# Patient Record
Sex: Male | Born: 1955 | Race: White | Hispanic: No | Marital: Married | Smoking: Never smoker
Health system: Southern US, Community
[De-identification: ages and names within clinical notes are randomized; demographics above are authoritative.]

## PROBLEM LIST (undated history)

## (undated) DIAGNOSIS — F5104 Psychophysiologic insomnia: Secondary | ICD-10-CM

## (undated) DIAGNOSIS — Z973 Presence of spectacles and contact lenses: Secondary | ICD-10-CM

## (undated) DIAGNOSIS — K5792 Diverticulitis of intestine, part unspecified, without perforation or abscess without bleeding: Secondary | ICD-10-CM

## (undated) DIAGNOSIS — K5732 Diverticulitis of large intestine without perforation or abscess without bleeding: Secondary | ICD-10-CM

## (undated) DIAGNOSIS — K219 Gastro-esophageal reflux disease without esophagitis: Secondary | ICD-10-CM

## (undated) DIAGNOSIS — K5900 Constipation, unspecified: Secondary | ICD-10-CM

## (undated) DIAGNOSIS — K760 Fatty (change of) liver, not elsewhere classified: Secondary | ICD-10-CM

## (undated) DIAGNOSIS — R338 Other retention of urine: Secondary | ICD-10-CM

## (undated) DIAGNOSIS — R42 Dizziness and giddiness: Secondary | ICD-10-CM

## (undated) DIAGNOSIS — L570 Actinic keratosis: Secondary | ICD-10-CM

## (undated) DIAGNOSIS — N401 Enlarged prostate with lower urinary tract symptoms: Secondary | ICD-10-CM

## (undated) DIAGNOSIS — M189 Osteoarthritis of first carpometacarpal joint, unspecified: Secondary | ICD-10-CM

## (undated) DIAGNOSIS — K210 Gastro-esophageal reflux disease with esophagitis: Secondary | ICD-10-CM

## (undated) DIAGNOSIS — I10 Essential (primary) hypertension: Secondary | ICD-10-CM

## (undated) DIAGNOSIS — M75102 Unspecified rotator cuff tear or rupture of left shoulder, not specified as traumatic: Secondary | ICD-10-CM

## (undated) DIAGNOSIS — M7550 Bursitis of unspecified shoulder: Secondary | ICD-10-CM

## (undated) HISTORY — DX: Constipation, unspecified: K59.00

## (undated) HISTORY — DX: Gastro-esophageal reflux disease with esophagitis: K21.0

## (undated) HISTORY — DX: Benign prostatic hyperplasia with lower urinary tract symptoms: N40.1

## (undated) HISTORY — DX: Osteoarthritis of first carpometacarpal joint, unspecified: M18.9

## (undated) HISTORY — DX: Unspecified rotator cuff tear or rupture of left shoulder, not specified as traumatic: M75.102

## (undated) HISTORY — DX: Psychophysiologic insomnia: F51.04

## (undated) HISTORY — PX: SMALL INTESTINE SURGERY: SHX150

## (undated) HISTORY — DX: Bursitis of unspecified shoulder: M75.50

## (undated) HISTORY — DX: Other retention of urine: R33.8

## (undated) HISTORY — DX: Gastro-esophageal reflux disease without esophagitis: K21.9

## (undated) HISTORY — DX: Diverticulitis of intestine, part unspecified, without perforation or abscess without bleeding: K57.92

## (undated) HISTORY — PX: EYE SURGERY: SHX253

## (undated) HISTORY — PX: COLON SURGERY: SHX602

## (undated) HISTORY — DX: Diverticulitis of large intestine without perforation or abscess without bleeding: K57.32

## (undated) HISTORY — DX: Actinic keratosis: L57.0

## (undated) HISTORY — DX: Essential (primary) hypertension: I10

## (undated) HISTORY — DX: Fatty (change of) liver, not elsewhere classified: K76.0

---

## 1989-05-21 HISTORY — PX: ROTATOR CUFF REPAIR: SHX139

## 1989-05-21 HISTORY — PX: SHOULDER ARTHROSCOPY W/ ROTATOR CUFF REPAIR: SHX2400

## 1990-05-21 HISTORY — PX: RHINOPLASTY: SUR1284

## 2004-10-31 ENCOUNTER — Ambulatory Visit: Payer: Self-pay | Admitting: Orthopaedic Surgery

## 2007-02-04 ENCOUNTER — Emergency Department: Payer: Self-pay

## 2008-04-28 ENCOUNTER — Ambulatory Visit: Payer: Self-pay | Admitting: Internal Medicine

## 2008-12-17 ENCOUNTER — Ambulatory Visit: Payer: Self-pay | Admitting: Family

## 2010-02-17 ENCOUNTER — Ambulatory Visit: Payer: Self-pay | Admitting: Internal Medicine

## 2010-02-21 ENCOUNTER — Ambulatory Visit: Payer: Self-pay | Admitting: Internal Medicine

## 2010-04-27 ENCOUNTER — Ambulatory Visit: Payer: Self-pay | Admitting: Gastroenterology

## 2010-04-28 LAB — PATHOLOGY REPORT

## 2010-10-30 ENCOUNTER — Ambulatory Visit: Payer: Self-pay | Admitting: Internal Medicine

## 2010-11-17 ENCOUNTER — Inpatient Hospital Stay: Payer: Self-pay | Admitting: Internal Medicine

## 2010-12-20 HISTORY — PX: COLON SURGERY: SHX602

## 2010-12-22 ENCOUNTER — Ambulatory Visit: Payer: Self-pay | Admitting: Surgery

## 2010-12-28 ENCOUNTER — Inpatient Hospital Stay: Payer: Self-pay | Admitting: Surgery

## 2011-01-02 LAB — PATHOLOGY REPORT

## 2011-02-02 ENCOUNTER — Ambulatory Visit (INDEPENDENT_AMBULATORY_CARE_PROVIDER_SITE_OTHER): Payer: PRIVATE HEALTH INSURANCE | Admitting: Internal Medicine

## 2011-02-02 ENCOUNTER — Encounter: Payer: Self-pay | Admitting: Internal Medicine

## 2011-02-02 VITALS — BP 114/76 | HR 80 | Temp 97.8°F | Resp 16 | Ht 71.0 in | Wt 201.8 lb

## 2011-02-02 DIAGNOSIS — J329 Chronic sinusitis, unspecified: Secondary | ICD-10-CM

## 2011-02-02 DIAGNOSIS — K5732 Diverticulitis of large intestine without perforation or abscess without bleeding: Secondary | ICD-10-CM

## 2011-02-02 MED ORDER — AMOXICILLIN-POT CLAVULANATE 875-125 MG PO TABS: 1.0000 | ORAL_TABLET | Freq: Two times a day (BID) | ORAL | Status: AC

## 2011-02-02 MED ORDER — HYDROCODONE-ACETAMINOPHEN 5-500 MG PO TABS: 2.0000 | ORAL_TABLET | Freq: Every evening | ORAL | Status: AC | PRN

## 2011-02-04 ENCOUNTER — Encounter: Payer: Self-pay | Admitting: Internal Medicine

## 2011-02-04 DIAGNOSIS — K5732 Diverticulitis of large intestine without perforation or abscess without bleeding: Secondary | ICD-10-CM

## 2011-02-04 DIAGNOSIS — K5730 Diverticulosis of large intestine without perforation or abscess without bleeding: Secondary | ICD-10-CM | POA: Insufficient documentation

## 2011-02-04 HISTORY — DX: Diverticulitis of large intestine without perforation or abscess without bleeding: K57.32

## 2011-02-04 NOTE — Assessment & Plan Note (Signed)
With 3 episodes in one year.  He is now s/p sigmoid resection with no complications.  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

## 2011-02-04 NOTE — Progress Notes (Signed)
  Subjective:    Patient ID: Seth Doyle, male    DOB: [birth year], 55 y.o.   MRN: [id number]  HPI Helthy 55 yo white male recently discharged from [facility] following uncomplicated sigmoid colon resection done by Renda Rolls electively for recurrent diverticulitis.  Patient denies abdoinal pain and diarrhea.  Has been following instructions regarding activity and diet.  Past Medical History  Diagnosis Date  . Diverticulitis    No current outpatient prescriptions on file prior to visit.    Review of Systems  Constitutional: Negative for fever, chills, diaphoresis, activity change, appetite change, fatigue and unexpected weight change.  HENT: Negative for hearing loss, ear pain, nosebleeds, congestion, sore throat, facial swelling, rhinorrhea, sneezing, drooling, mouth sores, trouble swallowing, neck pain, neck stiffness, dental problem, voice change, postnasal drip, sinus pressure, tinnitus and ear discharge.   Eyes: Negative for photophobia, pain, discharge, redness, itching and visual disturbance.  Respiratory: Negative for apnea, cough, choking, chest tightness, shortness of breath, wheezing and stridor.   Cardiovascular: Negative for chest pain, palpitations and leg swelling.  Gastrointestinal: Negative for nausea, vomiting, abdominal pain, diarrhea, constipation, blood in stool, abdominal distention, anal bleeding and rectal pain.  Genitourinary: Negative for dysuria, urgency, frequency, hematuria, flank pain, decreased urine volume, scrotal swelling, difficulty urinating and testicular pain.  Musculoskeletal: Negative for myalgias, back pain, joint swelling, arthralgias and gait problem.  Skin: Negative for color change, rash and wound.  Neurological: Negative for dizziness, tremors, seizures, syncope, speech difficulty, weakness, light-headedness, numbness and headaches.  Psychiatric/Behavioral: Negative for suicidal ideas, hallucinations, behavioral problems, confusion, sleep  disturbance, dysphoric mood, decreased concentration and agitation. The patient is not nervous/anxious.        Objective:   Physical Exam  Constitutional: He is oriented to person, place, and time.  HENT:  Head: Normocephalic and atraumatic.  Mouth/Throat: Oropharynx is clear and moist.  Eyes: Conjunctivae and EOM are normal.  Neck: Normal range of motion. Neck supple. No JVD present. No thyromegaly present.  Cardiovascular: Normal rate, regular rhythm and normal heart sounds.   Pulmonary/Chest: Effort normal and breath sounds normal. He has no wheezes. He has no rales.  Abdominal: Soft. Bowel sounds are normal. He exhibits no mass. There is no hepatosplenomegaly. There is tenderness in the suprapubic area. There is no rebound and no CVA tenderness.    Musculoskeletal: Normal range of motion. He exhibits no edema.  Neurological: He is alert and oriented to person, place, and time.  Skin: Skin is warm and dry.  Psychiatric: He has a normal mood and affect.          Assessment & Plan:

## 2011-03-01 ENCOUNTER — Encounter: Payer: Self-pay | Admitting: Internal Medicine

## 2011-06-18 ENCOUNTER — Other Ambulatory Visit: Payer: Self-pay | Admitting: *Deleted

## 2011-06-18 MED ORDER — ZOLPIDEM TARTRATE 10 MG PO TABS: 10.0000 mg | ORAL_TABLET | Freq: Every evening | ORAL | Status: DC | PRN

## 2011-06-18 NOTE — Telephone Encounter (Signed)
Medicine called to pharmacy. 

## 2011-06-18 NOTE — Telephone Encounter (Signed)
Faxed request from [facility] [place], no last filled date given.

## 2011-06-18 NOTE — Telephone Encounter (Signed)
OK to fill 1 month 3 refill

## 2011-11-01 ENCOUNTER — Other Ambulatory Visit: Payer: Self-pay | Admitting: Internal Medicine

## 2011-11-01 NOTE — Telephone Encounter (Signed)
Pt called needs [facility] [place]

## 2011-11-02 MED ORDER — ZOLPIDEM TARTRATE 10 MG PO TABS: 10.0000 mg | ORAL_TABLET | Freq: Every evening | ORAL | Status: DC | PRN

## 2011-12-27 LAB — HM COLONOSCOPY

## 2012-01-07 ENCOUNTER — Telehealth: Payer: Self-pay | Admitting: Internal Medicine

## 2012-01-07 NOTE — Telephone Encounter (Signed)
Pt called He is going on a cruise this week  And wanted to know if he needs something for motion sickness   What do you recommend

## 2012-02-22 ENCOUNTER — Telehealth: Payer: Self-pay | Admitting: Internal Medicine

## 2012-02-22 ENCOUNTER — Encounter: Payer: Self-pay | Admitting: Internal Medicine

## 2012-02-22 ENCOUNTER — Ambulatory Visit (INDEPENDENT_AMBULATORY_CARE_PROVIDER_SITE_OTHER): Payer: PRIVATE HEALTH INSURANCE | Admitting: Internal Medicine

## 2012-02-22 VITALS — BP 136/82 | HR 76 | Temp 97.8°F | Ht 71.0 in | Wt 222.2 lb

## 2012-02-22 DIAGNOSIS — K5732 Diverticulitis of large intestine without perforation or abscess without bleeding: Secondary | ICD-10-CM

## 2012-02-22 DIAGNOSIS — Z23 Encounter for immunization: Secondary | ICD-10-CM

## 2012-02-22 DIAGNOSIS — K219 Gastro-esophageal reflux disease without esophagitis: Secondary | ICD-10-CM

## 2012-02-22 MED ORDER — ZOLPIDEM TARTRATE 10 MG PO TABS: 10.0000 mg | ORAL_TABLET | Freq: Every evening | ORAL | Status: DC | PRN

## 2012-02-22 MED ORDER — DEXLANSOPRAZOLE 60 MG PO CPDR: 60.0000 mg | DELAYED_RELEASE_CAPSULE | Freq: Every day | ORAL | Status: DC

## 2012-02-22 NOTE — Progress Notes (Signed)
Patient ID: Seth Doyle, male   DOB: 12-03-1955, 56 y.o.   MRN: [id number]  Patient Active Problem List  Diagnosis  . Diverticulitis of sigmoid colon  . GERD (gastroesophageal reflux disease)    Subjective:  CC:   Chief Complaint  Patient presents with  . Heartburn    HPI:   Seth Doyle a 56 y.o. male who presents acid indigestion for the past 10 days.  He has been waking up with a cough productive of white foam.   Has been on prisloec and prevacid in the past with no significant change.  Denies abdominal pain and shortness of breath but reports  Regurgitation and burpnig of "battery acid."  Symptoms brought on by the stress of his college age daughter recently leaving for  [place] to study abroad for a semester .  No changes in stools.  No unintentional weight loss.  No daily use of NSAIDs, steroids  or alcohol.    Past Medical History  Diagnosis Date  . Diverticulitis     Past Surgical History  Procedure Date  . Rhinoplasty 1992    deviated septum due to volleyball  . Rotator cuff repair 1991    Dr. Ernest Pine  . Shoulder arthroscopy w/ rotator cuff repair 1991    [facility]  . Rhinoplasty 1992    foe deviated septm, traumatic  [facility]   . Colon surgery August 2012    sigmoid colectomy, diverticulitis  . Eye surgery     tear duct repair          The following portions of the patient's history were reviewed and updated as appropriate: Allergies, current medications, and problem list.    Review of Systems:   12 Pt  review of systems was negative except those addressed in the HPI,     History   Social History  . Marital Status: Married    Spouse Name: N/A    Number of Children: N/A  . Years of Education: N/A   Occupational History  . Not on file.   Social History Main Topics  . Smoking status: Never Smoker   . Smokeless tobacco: Never Used  . Alcohol Use: Yes     rare  . Drug Use: No  . Sexually Active: Not on file   Other Topics Concern    . Not on file   Social History Narrative  . No narrative on file    Objective:  BP 136/82  Pulse 76  Temp 97.8 F (36.6 C) (Oral)  Ht 5\' 11"  (1.803 m)  Wt 222 lb 4 oz (100.812 kg)  BMI 31.00 kg/m2  SpO2 97%  General appearance: alert, cooperative and appears stated age Throat: lips, mucosa, and tongue normal; teeth and gums normal Neck: no adenopathy, no carotid bruit, supple, symmetrical, trachea midline and thyroid not enlarged, symmetric, no tenderness/mass/nodules Back: symmetric, no curvature. ROM normal. No CVA tenderness. Lungs: clear to auscultation bilaterally Heart: regular rate and rhythm, S1, S2 normal, no murmur, click, rub or gallop Abdomen: soft, non-tender; bowel sounds normal; no masses,  no organomegaly. Suprapubic vertical midline scar well healed Pulses: 2+ and symmetric Skin: Skin color, texture, turgor normal. No rashes or lesions Lymph nodes: Cervical, supraclavicular, and axillary nodes normal.  Assessment and Plan:  GERD (gastroesophageal reflux disease) Trial of Dexilant, since he has failed use of OTC PPIs.  Reviewed behavioral modifications including avoidance of mint, chocolate and alcohol and overeating.  Advised to remain upright for at least 2 hours after eating.  If symptoms are not controlled with Dexilant, will refer to GI for ph probe and endoscopy.   Diverticulitis of sigmoid colon S/p colonic resection 2013 for recurrent diverticular flares .     Updated Medication List Outpatient Encounter Prescriptions as of 02/22/2012  Medication Sig Dispense Refill  . oxyCODONE-acetaminophen (PERCOCET) 5-325 MG per tablet Take 1 tablet by mouth every 4 (four) hours as needed.        . zolpidem (AMBIEN) 10 MG tablet Take 1 tablet (10 mg total) by mouth at bedtime as needed.  30 tablet  5  . DISCONTD: zolpidem (AMBIEN) 10 MG tablet Take 1 tablet (10 mg total) by mouth at bedtime as needed.  30 tablet  3  . dexlansoprazole (DEXILANT) 60 MG capsule  Take 1 capsule (60 mg total) by mouth daily.  30 capsule  2     Orders Placed This Encounter  Procedures  . Varicella-zoster vaccine subcutaneous  . Flu vaccine greater than or equal to 3yo preservative free IM    No Follow-up on file.

## 2012-02-22 NOTE — Patient Instructions (Addendum)
Reduce DPs to one daily  Stop all otc reflux meds and try dexilant one time daily  Avoid mint chocolate and wine.    Eat smaller ,more frequent meals to avoid stretching the esophageal sphincter    If no improvement  In 2 to 3 weeks,  We will refer your for an endoscopy

## 2012-02-22 NOTE — Telephone Encounter (Signed)
Prior Authorization for dexilant dr 60 mg capsule take 1 capsule by mouth once daily I called the [phone] to request forms. I was redirected to [phone] who handles prior auths for [place]. They will fax form within the next 5 minutes.

## 2012-02-23 ENCOUNTER — Encounter: Payer: Self-pay | Admitting: Internal Medicine

## 2012-02-23 DIAGNOSIS — K219 Gastro-esophageal reflux disease without esophagitis: Secondary | ICD-10-CM | POA: Insufficient documentation

## 2012-02-23 NOTE — Assessment & Plan Note (Signed)
Trial of Dexilant, since he has failed use of OTC PPIs.  Reviewed behavioral modifications including avoidance of mint, chocolate and alcohol and overeating.  Advised to remain upright for at least 2 hours after eating.  If symptoms are not controlled with Dexilant, will refer to GI for ph probe and endoscopy.

## 2012-02-23 NOTE — Assessment & Plan Note (Signed)
S/p colonic resection 2013 for recurrent diverticular flares .

## 2012-02-29 NOTE — Telephone Encounter (Signed)
The paperwork from [place] is in red folder.

## 2012-03-05 ENCOUNTER — Other Ambulatory Visit: Payer: Self-pay

## 2012-03-05 MED ORDER — ESOMEPRAZOLE MAGNESIUM 40 MG PO CPDR: 40.0000 mg | DELAYED_RELEASE_CAPSULE | Freq: Every day | ORAL | Status: DC

## 2012-03-05 NOTE — Telephone Encounter (Signed)
Per Dr. Darrick Huntsman, Nexium 40 mg #30 3 R sent Electronic to [facility]

## 2012-03-10 ENCOUNTER — Telehealth: Payer: Self-pay | Admitting: *Deleted

## 2012-03-10 NOTE — Telephone Encounter (Signed)
R'cd fax from [facility] [address] [place] for [place] of [address] [address]. Called insurance company-waiting to receive fax.

## 2012-03-25 ENCOUNTER — Telehealth: Payer: Self-pay | Admitting: *Deleted

## 2012-03-25 DIAGNOSIS — G4700 Insomnia, unspecified: Secondary | ICD-10-CM

## 2012-03-25 MED ORDER — ZOLPIDEM TARTRATE 10 MG PO TABS: 10.0000 mg | ORAL_TABLET | Freq: Every evening | ORAL | Status: DC | PRN

## 2012-03-25 NOTE — Telephone Encounter (Signed)
Spoke with patient to confirm what medication he was taking. Nexium was filled on 1-16 patient has been taking daily and it is working well for him. Reflux S/S are much more controlled. He never received the Dexilant states it was denied and that the alternative was called in. He has also requested a refill of his Ambien ... Please advise. He wants the prescription called to [facility] employee pharm as he is now covered on his wife's insurance.

## 2012-03-25 NOTE — Telephone Encounter (Signed)
Please send rx to [facility].  New pharamacy for patient,  [facility] as well

## 2012-03-26 ENCOUNTER — Other Ambulatory Visit: Payer: Self-pay

## 2012-03-26 ENCOUNTER — Other Ambulatory Visit: Payer: Self-pay | Admitting: *Deleted

## 2012-03-28 NOTE — Telephone Encounter (Signed)
Rx faxed to [facility] [phone]

## 2012-04-25 ENCOUNTER — Telehealth: Payer: Self-pay | Admitting: Internal Medicine

## 2012-04-25 MED ORDER — ESOMEPRAZOLE MAGNESIUM 40 MG PO CPDR: 40.0000 mg | DELAYED_RELEASE_CAPSULE | Freq: Every day | ORAL | Status: DC

## 2012-04-25 NOTE — Telephone Encounter (Signed)
nexium 40 mg # 30 3 R sent to [facility].

## 2012-04-25 NOTE — Telephone Encounter (Signed)
Refill on Nexium 40 mg . New insurance [facility].  Call into hospital pharmacy.

## 2012-04-25 NOTE — Telephone Encounter (Signed)
Rx Nexium 40 mg sent electronic to [facility]

## 2012-04-29 ENCOUNTER — Other Ambulatory Visit: Payer: Self-pay

## 2012-04-29 DIAGNOSIS — K2970 Gastritis, unspecified, without bleeding: Secondary | ICD-10-CM

## 2012-04-29 MED ORDER — PANTOPRAZOLE SODIUM 40 MG PO TBEC: 40.0000 mg | DELAYED_RELEASE_TABLET | Freq: Every day | ORAL | Status: DC

## 2012-04-29 NOTE — Telephone Encounter (Signed)
Spoke to [place] at [place] [phone] , regarding PA for Nexium 40 mg. She stated that the patient has to try Omeprazole, Lansoprazole, or Pantoprazole first before the Nexium. Called patient left message on vm asking patient to call me back so I can relate message to him.

## 2012-04-29 NOTE — Telephone Encounter (Signed)
Sent rx for protnoix.

## 2012-06-09 ENCOUNTER — Encounter: Payer: Self-pay | Admitting: Internal Medicine

## 2012-06-09 ENCOUNTER — Ambulatory Visit (INDEPENDENT_AMBULATORY_CARE_PROVIDER_SITE_OTHER): Payer: 59 | Admitting: Internal Medicine

## 2012-06-09 VITALS — BP 116/70 | HR 80 | Temp 98.2°F | Resp 16 | Wt 221.5 lb

## 2012-06-09 DIAGNOSIS — J069 Acute upper respiratory infection, unspecified: Secondary | ICD-10-CM

## 2012-06-09 MED ORDER — HYDROCOD POLST-CHLORPHEN POLST 10-8 MG/5ML PO LQCR: 10.0000 mL | Freq: Two times a day (BID) | ORAL | Status: DC | PRN

## 2012-06-09 MED ORDER — PREDNISONE (PAK) 10 MG PO TABS: ORAL_TABLET | ORAL | Status: DC

## 2012-06-09 MED ORDER — AMOXICILLIN-POT CLAVULANATE 875-125 MG PO TABS: 1.0000 | ORAL_TABLET | Freq: Two times a day (BID) | ORAL | Status: DC

## 2012-06-09 NOTE — Progress Notes (Signed)
Patient ID: Seth Doyle, male   DOB: Dec 04, 1955, 57 y.o.   MRN: [id number] Patient Active Problem List  Diagnosis  . Diverticulitis of sigmoid colon  . GERD (gastroesophageal reflux disease)  . URI, acute    Subjective:  CC:   Chief Complaint  Patient presents with  . Cough    productive, yello color    HPI:   Seth Doyle Partinis a 57 y.o. male who presents with a  3 day history of cough, sore throat and frontal headache,  No fevers myalgias or n/v/d.  No flu like sympomts or sick contacts.  Taking nyquil.  Cough keeping him up at night    Past Medical History  Diagnosis Date  . Diverticulitis     Past Surgical History  Procedure Date  . Rhinoplasty 1992    deviated septum due to volleyball  . Rotator cuff repair 1991    Dr. Ernest Pine  . Shoulder arthroscopy w/ rotator cuff repair 1991    [facility]  . Rhinoplasty 1992    foe deviated septm, traumatic  [facility]   . Colon surgery August 2012    sigmoid colectomy, diverticulitis  . Eye surgery     tear duct repair          The following portions of the patient's history were reviewed and updated as appropriate: Allergies, current medications, and problem list.    Review of Systems:   12 Pt  review of systems was negative except those addressed in the HPI,     History   Social History  . Marital Status: Married    Spouse Name: N/A    Number of Children: N/A  . Years of Education: N/A   Occupational History  . Not on file.   Social History Main Topics  . Smoking status: Never Smoker   . Smokeless tobacco: Never Used  . Alcohol Use: Yes     Comment: rare  . Drug Use: No  . Sexually Active: Not on file   Other Topics Concern  . Not on file   Social History Narrative  . No narrative on file    Objective:  BP 116/70  Pulse 80  Temp 98.2 F (36.8 C) (Oral)  Resp 16  Wt 221 lb 8 oz (100.472 kg)  SpO2 98%  General appearance: alert, cooperative and appears stated age Ears: normal TM's and  external ear canals both ears Throat: lips, mucosa, and tongue normal; teeth and gums normal Neck: no adenopathy, no carotid bruit, supple, symmetrical, trachea midline and thyroid not enlarged, symmetric, no tenderness/mass/nodules Back: symmetric, no curvature. ROM normal. No CVA tenderness. Lungs: clear to auscultation bilaterally Heart: regular rate and rhythm, S1, S2 normal, no murmur, click, rub or gallop Abdomen: soft, non-tender; bowel sounds normal; no masses,  no organomegaly Pulses: 2+ and symmetric Skin: Skin color, texture, turgor normal. No rashes or lesions Lymph nodes: Cervical, supraclavicular, and axillary nodes normal.  Assessment and Plan:  URI, acute This URI is most likely viral given  ild HEENT  symptoms .  I have explained that in viral URIS, an antibiotic will not help the symptoms and will increase the risk of developing diarrhea.,  Continue oral and nasal decongestants,  Ibuprofen 400 mg and tylenol 650 mq 8 hrs for aches and pains,  And will addtessalon 100 mg every 8 hours prn cough  And seconda round of abx only if symptoms worsen to include fevers, facial pain, purulent sputum./drainage.    Updated Medication List Outpatient Encounter  Prescriptions as of 06/09/2012  Medication Sig Dispense Refill  . esomeprazole (NEXIUM) 40 MG capsule Take 1 capsule (40 mg total) by mouth daily before breakfast.  30 capsule  3  . oxyCODONE-acetaminophen (PERCOCET) 5-325 MG per tablet Take 1 tablet by mouth every 4 (four) hours as needed.        . pantoprazole (PROTONIX) 40 MG tablet Take 1 tablet (40 mg total) by mouth daily.  30 tablet  3  . pantoprazole (PROTONIX) 40 MG tablet Take 40 mg by mouth daily.      Marland Kitchen zolpidem (AMBIEN) 10 MG tablet Take 1 tablet (10 mg total) by mouth at bedtime as needed.  30 tablet  5  . amoxicillin-clavulanate (AUGMENTIN) 875-125 MG per tablet Take 1 tablet by mouth 2 (two) times daily.  14 tablet  0  . chlorpheniramine-HYDROcodone (TUSSIONEX)  10-8 MG/5ML LQCR Take 10 mLs by mouth every 12 (twelve) hours as needed.  240 mL  0  . predniSONE (STERAPRED UNI-PAK) 10 MG tablet 6 tablets on Day 1 , then reduce by 1 tablet daily until gone  21 tablet  0     No orders of the defined types were placed in this encounter.    No Follow-up on file.

## 2012-06-09 NOTE — Patient Instructions (Addendum)
You have a  vitral URI  .  I also advise use of the following OTC meds to help with your other symptoms.   Take generic OTC benadryl 25 mg every 8 hours for the drainage,  Sudafed PE  10 to 30 mg every 8 hours for the congestion, you may substitute Afrin nasal spray for the nighttime dose of sudafed PE  If needed to prevent insomnia.  flushes your sinuses twice daily with Simply Saline (do over the sink because if you do it right you will spit out globs of mucus)  Use Tussionex for nighttime cough   Gargle with salt water as needed for sore throat.    If you develop facial pain,  Ear pain,  Green/bloody sputum or nasal drainage,  Start the augmentin twice daily as antibiotic and the prednisone taper for the inflammation

## 2012-06-09 NOTE — Assessment & Plan Note (Signed)
This URI is most likely viral given  ild HEENT  symptoms .  I have explained that in viral URIS, an antibiotic will not help the symptoms and will increase the risk of developing diarrhea.,  Continue oral and nasal decongestants,  Ibuprofen 400 mg and tylenol 650 mq 8 hrs for aches and pains,  And will addtessalon 100 mg every 8 hours prn cough  And seconda round of abx only if symptoms worsen to include fevers, facial pain, purulent sputum./drainage.

## 2012-06-15 ENCOUNTER — Encounter: Payer: Self-pay | Admitting: Internal Medicine

## 2012-06-16 MED ORDER — LEVOFLOXACIN 500 MG PO TABS: 500.0000 mg | ORAL_TABLET | Freq: Every day | ORAL | Status: DC

## 2012-06-16 MED ORDER — HYDROCOD POLST-CHLORPHEN POLST 10-8 MG/5ML PO LQCR: 10.0000 mL | Freq: Two times a day (BID) | ORAL | Status: DC | PRN

## 2012-06-16 NOTE — Telephone Encounter (Signed)
Jess please call or fax the refill on the tussionex to [facility] for Mr Arocho thanks

## 2012-07-09 ENCOUNTER — Encounter: Payer: Self-pay | Admitting: Internal Medicine

## 2012-07-22 ENCOUNTER — Telehealth: Payer: Self-pay | Admitting: *Deleted

## 2012-07-22 NOTE — Telephone Encounter (Signed)
Called [phone] for prior authorization on:  Dexilant 60 mg  Form is being faxed over now

## 2012-09-08 ENCOUNTER — Encounter: Payer: Self-pay | Admitting: Internal Medicine

## 2012-09-08 DIAGNOSIS — K2970 Gastritis, unspecified, without bleeding: Secondary | ICD-10-CM

## 2012-09-08 MED ORDER — ESOMEPRAZOLE MAGNESIUM 40 MG PO CPDR: 40.0000 mg | DELAYED_RELEASE_CAPSULE | Freq: Every day | ORAL | Status: DC

## 2012-09-08 MED ORDER — SUCRALFATE 1 GM/10ML PO SUSP: 1.0000 g | Freq: Four times a day (QID) | ORAL | Status: DC

## 2012-09-09 ENCOUNTER — Ambulatory Visit (INDEPENDENT_AMBULATORY_CARE_PROVIDER_SITE_OTHER): Payer: BC Managed Care – PPO | Admitting: Internal Medicine

## 2012-09-09 ENCOUNTER — Encounter: Payer: Self-pay | Admitting: Internal Medicine

## 2012-09-09 VITALS — BP 112/78 | HR 78 | Temp 97.8°F | Resp 16 | Wt 223.5 lb

## 2012-09-09 DIAGNOSIS — E785 Hyperlipidemia, unspecified: Secondary | ICD-10-CM

## 2012-09-09 DIAGNOSIS — R1013 Epigastric pain: Secondary | ICD-10-CM

## 2012-09-09 NOTE — Progress Notes (Signed)
Patient ID: Seth Doyle, male   DOB: 02-26-1956, 57 y.o.   MRN: [id number]   Patient Active Problem List  Diagnosis  . Diverticulitis of sigmoid colon  . GERD (gastroesophageal reflux disease)  . URI, acute  . Reflux esophagitis    Subjective:  CC:   Chief Complaint  Patient presents with  . Follow-up    heart burn    HPI:   Seth Doyle a 57 y.o. male who presents Follow up for worsening reflux.  Her symptoms have worsened since his PPI was changed from Dexilant to Protonix due to insurance noncoverage,  He is waking up at night with cough productive of  foaming bitter acidic taste in mouth every night.  No abdominal pain or unintentional weight loss or dark stools.     Past Medical History  Diagnosis Date  . Diverticulitis     Past Surgical History  Procedure Laterality Date  . Rhinoplasty  1992    deviated septum due to volleyball  . Rotator cuff repair  1991    Dr. Ernest Pine  . Shoulder arthroscopy w/ rotator cuff repair  1991    [facility]  . Rhinoplasty  1992    foe deviated septm, traumatic  [facility]   . Colon surgery  August 2012    sigmoid colectomy, diverticulitis  . Eye surgery      tear duct repair        The following portions of the patient's history were reviewed and updated as appropriate: Allergies, current medications, and problem list.    Review of Systems:   12 Pt  review of systems was negative except those addressed in the HPI,     History   Social History  . Marital Status: Married    Spouse Name: N/A    Number of Children: N/A  . Years of Education: N/A   Occupational History  . Not on file.   Social History Main Topics  . Smoking status: Never Smoker   . Smokeless tobacco: Never Used  . Alcohol Use: Yes     Comment: rare  . Drug Use: No  . Sexually Active: Not on file   Other Topics Concern  . Not on file   Social History Narrative  . No narrative on file    Objective:  BP 112/78  Pulse 78  Temp(Src)  97.8 F (36.6 C) (Oral)  Resp 16  Wt 223 lb 8 oz (101.379 kg)  BMI 31.19 kg/m2  SpO2 98%  General appearance: alert, cooperative and appears stated age Ears: normal TM's and external ear canals both ears Throat: lips, mucosa, and tongue normal; teeth and gums normal Neck: no adenopathy, no carotid bruit, supple, symmetrical, trachea midline and thyroid not enlarged, symmetric, no tenderness/mass/nodules Back: symmetric, no curvature. ROM normal. No CVA tenderness. Lungs: clear to auscultation bilaterally Heart: regular rate and rhythm, S1, S2 normal, no murmur, click, rub or gallop Abdomen: soft, non-tender; bowel sounds normal; no masses,  no organomegaly Pulses: 2+ and symmetric Skin: Skin color, texture, turgor normal. No rashes or lesions Lymph nodes: Cervical, supraclavicular, and axillary nodes normal.  Assessment and Plan:  Reflux esophagitis Dietary and positioning measures discussed.  Nexium resumed , dexilant if coverage will allow.  Take home stool cards given . Refer to GI of symptoms persist, for PH probe and EGD. Marland Kitchen    Updated Medication List Outpatient Encounter Prescriptions as of 09/09/2012  Medication Sig Dispense Refill  . esomeprazole (NEXIUM) 40 MG capsule Take 1 capsule (  40 mg total) by mouth daily before breakfast.  30 capsule  3  . oxyCODONE-acetaminophen (PERCOCET) 5-325 MG per tablet Take 1 tablet by mouth every 4 (four) hours as needed.        . pantoprazole (PROTONIX) 40 MG tablet Take 1 tablet (40 mg total) by mouth daily.  30 tablet  3  . pantoprazole (PROTONIX) 40 MG tablet Take 40 mg by mouth daily.      . sucralfate (CARAFATE) 1 GM/10ML suspension Take 10 mLs (1 g total) by mouth 4 (four) times daily.  420 mL  0  . zolpidem (AMBIEN) 10 MG tablet Take 1 tablet (10 mg total) by mouth at bedtime as needed.  30 tablet  5  . chlorpheniramine-HYDROcodone (TUSSIONEX) 10-8 MG/5ML LQCR Take 10 mLs by mouth every 12 (twelve) hours as needed.  240 mL  0  .  levofloxacin (LEVAQUIN) 500 MG tablet Take 1 tablet (500 mg total) by mouth daily.  7 tablet  0  . predniSONE (STERAPRED UNI-PAK) 10 MG tablet 6 tablets on Day 1 , then reduce by 1 tablet daily until gone  21 tablet  0   No facility-administered encounter medications on file as of 09/09/2012.     Orders Placed This Encounter  Procedures  . CBC with Differential  . Comprehensive metabolic panel  . Lipid panel  . H. pylori antibody, IgG    No Follow-up on file.

## 2012-09-09 NOTE — Patient Instructions (Addendum)
You need to lose 10%  Of your current body weight over the next 6 months   This is  my version of a  "Low GI"  Diet:  It is not ultra low carb, but will still lower your blood sugars and allow you to lose 5 to 10 lbs per month if you follow it carefully. All of the foods can be found at grocery stores and in bulk at [employer].  The Atkins protein bars and shakes are available in more varieties at Target, WalMart and [employer]'s [employer].    All of the foods can be found at grocery stores and in bulk at [employer].  The Atkins protein bars and shakes are available in more varieties at Target, WalMart and [employer].     7 AM Breakfast:  Choose from the following:  Low carbohydrate Protein  Shakes (I recommend the EAS AdvantEdge "Carb Control" shakes  Or the low carb shakes by Atkins.    2.5 carbs   Arnold's "Sandwhich Thin"toasted  w/ peanut butter (no jelly: about 20 net carbs  "Bagel Thin" with cream cheese and salmon: about 20 carbs   a scrambled egg/bacon/cheese burrito made with Mission's "carb balance" whole wheat tortilla  (about 10 net carbs )   Avoid cereal and bananas, oatmeal and cream of wheat and grits. They are loaded with carbohydrates!   10 AM: high protein snack  Protein bar by Atkins (the snack size, under 200 cal, usually < 6 net carbs).    A stick of cheese:  Around 1 carb,  100 cal     Dannon Light n Fit [place] Yogurt  (80 cal, 8 carbs)  Other so called "protein bars" and Greek yogurts tend to be loaded with carbohydrates.  Remember, in food advertising, the word "energy" is synonymous for " carbohydrate."  Lunch:   A Sandwich using the bread choices listed, Can use any  Eggs,  lunchmeat, grilled meat or canned tuna), avocado, regular mayo/mustard  and cheese.  A Salad using blue cheese, ranch,  Goddess or vinagrette,  No croutons or "confetti" and no "candied nuts" but regular nuts OK.   No pretzels or chips.  Pickles and miniature sweet peppers are a good low carb alternative  that provide a "crunch"  The bread is the only source of carbohydrate in a sandwich and  can be decreased by trying some of these alternatives to traditional loaf bread  Joseph's makes a pita bread and a flat bread that are 50 cal and 4 net carbs available at BJs and WalMart.  This can be toasted to use with hummous as well  Toufayan makes a low carb flatbread that's 100 cal and 9 net carbs available at [employer] and [employer] makes 2 sizes of  Low carb whole wheat tortilla  (The large one is 210 cal and 6 net carbs) Avoid "Low fat dressings, as well as Reyne Dumas and [address] dressings They are loaded with sugar!   3 PM/ Mid day  Snack:  Consider  1 ounce of  almonds, walnuts, pistachios, pecans, peanuts,  Macadamia nuts or a nut medley.  Avoid "granola"; the dried cranberries and raisins are loaded with carbohydrates. Mixed nuts as long as there are no raisins,  cranberries or dried fruit.     6 PM  Dinner:     Meat/fowl/fish with a green salad, and either broccoli, cauliflower, green beans, spinach, brussel sprouts or  Lima beans. DO NOT BREAD THE  PROTEIN!!      There is a low carb pasta by Dreamfield's that is acceptable and tastes great: only 5 digestible carbs/serving.( All grocery stores but BJs carry it )  Try Kai Levins Angelo's chicken piccata or chicken or eggplant parm over low carb pasta.(Lowes and BJs)   Clifton Custard Sanchez's "Carnitas" (pulled pork, no sauce,  0 carbs) or his beef pot roast to make a dinner burrito (at BJ's)  Pesto over low carb pasta (bj's sells a good quality pesto in the center refrigerated section of the deli   Whole wheat pasta is still full of digestible carbs and  Not as low in glycemic index as Dreamfield's.   Brown rice is still rice,  So skip the rice and noodles if you eat [place] or [place] (or at least limit to 1/2 cup)  9 PM snack :   Breyer's "low carb" fudgsicle or  ice cream bar (Carb Smart line), or  Weight Watcher's ice cream bar , or another  "no sugar added" ice cream;  a serving of fresh berries/cherries with whipped cream   Cheese or DANNON'S LlGHT N FIT [place] YOGURT  Avoid bananas, pineapple, grapes  and watermelon on a regular basis because they are high in sugar.  THINK OF THEM AS DESSERT  Remember that snack Substitutions should be less than 10 NET carbs per serving and meals < 20 carbs. Remember to subtract fiber grams to get the "net carbs."

## 2012-09-10 ENCOUNTER — Encounter: Payer: Self-pay | Admitting: Internal Medicine

## 2012-09-10 DIAGNOSIS — K2100 Gastro-esophageal reflux disease with esophagitis, without bleeding: Secondary | ICD-10-CM

## 2012-09-10 HISTORY — DX: Gastro-esophageal reflux disease with esophagitis, without bleeding: K21.00

## 2012-09-11 NOTE — Assessment & Plan Note (Addendum)
Dietary and positioning measures discussed.  Nexium resumed , dexilant if coverage will allow.  Take home stool cards given . Refer to GI of symptoms persist, for PH probe and EGD. Marland Kitchen

## 2012-09-12 ENCOUNTER — Other Ambulatory Visit (INDEPENDENT_AMBULATORY_CARE_PROVIDER_SITE_OTHER): Payer: BC Managed Care – PPO

## 2012-09-12 DIAGNOSIS — R1013 Epigastric pain: Secondary | ICD-10-CM

## 2012-09-12 DIAGNOSIS — E785 Hyperlipidemia, unspecified: Secondary | ICD-10-CM

## 2012-09-12 LAB — H. PYLORI ANTIBODY, IGG: H Pylori IgG: NEGATIVE

## 2012-09-12 LAB — CBC WITH DIFFERENTIAL/PLATELET
Basophils Absolute: 0 10*3/uL (ref 0.0–0.1)
Eosinophils Absolute: 0.1 10*3/uL (ref 0.0–0.7)
Lymphocytes Relative: 25.7 % (ref 12.0–46.0)
MCHC: 34.7 g/dL (ref 30.0–36.0)
Monocytes Absolute: 0.6 10*3/uL (ref 0.1–1.0)
Neutrophils Relative %: 61 % (ref 43.0–77.0)
Platelets: 193 10*3/uL (ref 150.0–400.0)
RDW: 12.4 % (ref 11.5–14.6)

## 2012-09-12 LAB — COMPREHENSIVE METABOLIC PANEL
ALT: 27 U/L (ref 0–53)
AST: 29 U/L (ref 0–37)
Albumin: 4.3 g/dL (ref 3.5–5.2)
Alkaline Phosphatase: 52 U/L (ref 39–117)
Calcium: 9.3 mg/dL (ref 8.4–10.5)
Chloride: 103 mEq/L (ref 96–112)
Creatinine, Ser: 1.4 mg/dL (ref 0.4–1.5)
Potassium: 4.3 mEq/L (ref 3.5–5.1)

## 2012-09-12 LAB — LIPID PANEL: Total CHOL/HDL Ratio: 5

## 2012-09-14 ENCOUNTER — Encounter: Payer: Self-pay | Admitting: Internal Medicine

## 2012-09-17 ENCOUNTER — Telehealth: Payer: Self-pay | Admitting: Internal Medicine

## 2012-09-17 ENCOUNTER — Encounter: Payer: Self-pay | Admitting: Internal Medicine

## 2012-09-17 NOTE — Telephone Encounter (Signed)
Left message for patient to call back  

## 2012-09-17 NOTE — Telephone Encounter (Signed)
Patient Information:  Caller Name: Micajah  Phone: [phone]  Patient: Seth Doyle, Seth Doyle  Gender: Male  DOB: 1956-02-28  Age: 57 Years  PCP: Duncan Dull (Adults only)  Office Follow Up:  Does the office need to follow up with this patient?: No  Instructions For The Office: N/A  RN Note:  RX standing orders used:  Polytrim eye gtts - 2 drops both eyes QID x 5 days called into [facility] in [place] [phone] (message left on voicemail)  Symptoms  Reason For Call & Symptoms: right eye itching and this am his eye was crusted over (yellow draining).  Pt also reports he is having yellow drainage throughout the day  Reviewed Health History In EMR: Yes  Reviewed Medications In EMR: Yes  Reviewed Allergies In EMR: Yes  Reviewed Surgeries / Procedures: Yes  Date of Onset of Symptoms: 09/16/2012  Guideline(s) Used:  Eye - Pus or Discharge  Disposition Per Guideline:   Home Care  Reason For Disposition Reached:   Eye with yellow/green discharge or eyelashes stick together, and PCP standing order to call in antibiotic eye drops  Advice Given:  Reassurance:  A small amount of pus or mucus in the inner corner of the eye is often due to a cold or virus. Sometimes it's just a reaction to an irritant in the eye.  You can get pink eye from someone who has had it recently.  Pink eye will not harm your eyesight.  Viral conjunctivitis (pink eye) does not need antibiotic treatment. It gets better by itself. Usually it's gone in 2 or 3 days.  Eyelid Cleansing:   Gently wash eyelids and lashes with warm water and wet cotton balls (or cotton gauze). Remove all the dried and liquid pus.  Do this as often as needed.  Contact Lenses:  Switch to glasses for a short time. This will help stop damage to your eye.  Clean your contacts before wearing them again.  Throw away used contacts if they are meant to be thrown away.  Contagiousness:  Pinkeye is contagious. It is very easy to spread to other  people. You can spread it by shaking hands.  Try not to touch your eyes. Wash your hands often. Do not share towels. Try not to touch your eyes. Wash your hands frequently. Do not share towels.  You may return to work or school. Avoid physical contact (e.g., shaking hands) until the symptoms have resolved.  Call Back If  Pus increases in amount  Pus in corner of eye lasts over 3 days  Eyelid becomes red or swollen  You become worse.  Patient Will Follow Care Advice:  YES

## 2012-10-03 NOTE — Telephone Encounter (Signed)
Patient spoke with triage nurse

## 2012-10-06 ENCOUNTER — Other Ambulatory Visit: Payer: Self-pay | Admitting: *Deleted

## 2012-10-06 ENCOUNTER — Other Ambulatory Visit (INDEPENDENT_AMBULATORY_CARE_PROVIDER_SITE_OTHER): Payer: BC Managed Care – PPO

## 2012-10-06 DIAGNOSIS — Z1211 Encounter for screening for malignant neoplasm of colon: Secondary | ICD-10-CM

## 2012-10-19 ENCOUNTER — Encounter: Payer: Self-pay | Admitting: Internal Medicine

## 2012-10-20 ENCOUNTER — Telehealth: Payer: Self-pay

## 2012-10-20 DIAGNOSIS — G4700 Insomnia, unspecified: Secondary | ICD-10-CM

## 2012-10-20 MED ORDER — ZOLPIDEM TARTRATE 10 MG PO TABS: 10.0000 mg | ORAL_TABLET | Freq: Every evening | ORAL | Status: DC | PRN

## 2012-10-20 NOTE — Telephone Encounter (Signed)
Ok to refill,  Authorized in epic 

## 2012-10-20 NOTE — Telephone Encounter (Signed)
Patient sent a mychart message for a refill on his Ambien. Patient was last seen on 09/09/12 and his last refill was on 03/25/2012 #30 with 5rf

## 2012-10-20 NOTE — Telephone Encounter (Signed)
Okay to refill? 

## 2012-10-20 NOTE — Telephone Encounter (Signed)
Faxed over prescription 2 times.

## 2013-02-01 ENCOUNTER — Encounter: Payer: Self-pay | Admitting: Internal Medicine

## 2013-02-02 ENCOUNTER — Encounter: Payer: Self-pay | Admitting: Internal Medicine

## 2013-02-02 ENCOUNTER — Telehealth: Payer: Self-pay | Admitting: Internal Medicine

## 2013-02-02 ENCOUNTER — Ambulatory Visit (INDEPENDENT_AMBULATORY_CARE_PROVIDER_SITE_OTHER): Payer: BC Managed Care – PPO | Admitting: Internal Medicine

## 2013-02-02 VITALS — BP 130/88 | HR 88 | Temp 99.2°F | Resp 14 | Ht 70.5 in | Wt 218.2 lb

## 2013-02-02 DIAGNOSIS — J069 Acute upper respiratory infection, unspecified: Secondary | ICD-10-CM

## 2013-02-02 MED ORDER — HYDROCOD POLST-CHLORPHEN POLST 10-8 MG/5ML PO LQCR: 10.0000 mL | Freq: Two times a day (BID) | ORAL | Status: DC | PRN

## 2013-02-02 MED ORDER — AMOXICILLIN-POT CLAVULANATE 875-125 MG PO TABS: 1.0000 | ORAL_TABLET | Freq: Two times a day (BID) | ORAL | Status: DC

## 2013-02-02 NOTE — Telephone Encounter (Signed)
Appointment made pt aware  Ask pt to be here @ 4:30

## 2013-02-02 NOTE — Progress Notes (Signed)
Patient ID: Seth Doyle, male   DOB: [birth year], 57 y.o.   MRN: [id number]   Patient Active Problem List   Diagnosis Date Noted  . Acute upper respiratory infections of unspecified site 02/03/2013  . Reflux esophagitis 09/10/2012  . URI, acute 06/09/2012  . GERD (gastroesophageal reflux disease) 02/23/2012  . Diverticulitis of sigmoid colon 02/04/2011    Subjective:  CC:   Chief Complaint  Patient presents with  . Acute Visit    left ear pain and entire left side of head    HPI:   Seth Doyle a 57 y.o. male who presents Headache, bilateral ear pain.  Right ear pain  started 3 days ago, while preparing to return from a cruise to the [place] during which time he had gone  snorkeling and swimming in the ocean and in the ship's pool.  After 24 hours she developed bilateral frontal and maxillary sinus pain and nonproductive cough. He feels generally malaise. He has not checked his temperature but is has low-grade fevers. No body aches. No diarrhea.  .    Past Medical History  Diagnosis Date  . Diverticulitis     Past Surgical History  Procedure Laterality Date  . Rhinoplasty  1992    deviated septum due to volleyball  . Rotator cuff repair  1991    Dr. Ernest Pine  . Shoulder arthroscopy w/ rotator cuff repair  1991    [facility]  . Rhinoplasty  1992    foe deviated septm, traumatic  [facility]   . Colon surgery  August 2012    sigmoid colectomy, diverticulitis  . Eye surgery      tear duct repair        The following portions of the patient's history were reviewed and updated as appropriate: Allergies, current medications, and problem list.    Review of Systems:   12 Pt  review of systems was negative except those addressed in the HPI,     History   Social History  . Marital Status: Married    Spouse Name: N/A    Number of Children: N/A  . Years of Education: N/A   Occupational History  . Not on file.   Social History Main Topics  . Smoking status:  Never Smoker   . Smokeless tobacco: Never Used  . Alcohol Use: Yes     Comment: rare  . Drug Use: No  . Sexual Activity: Not on file   Other Topics Concern  . Not on file   Social History Narrative  . No narrative on file    Objective:  Filed Vitals:   02/02/13 1702  BP: 130/88  Pulse: 88  Temp: 99.2 F (37.3 C)  Resp: 14     General appearance: alert, cooperative and appears stated age Ears: bilateral enflamed TM's without bulging or effusions Throat: lips, mucosa, and tongue normal; teeth and gums normal.  Tonsillar exudate with ulceration  Neck: cervical adenopathy, no carotid bruit, supple, symmetrical, trachea midline and thyroid not enlarged, symmetric,  Back: symmetric, no curvature. ROM normal. No CVA tenderness. Lungs: clear to auscultation bilaterally Heart: regular rate and rhythm, S1, S2 normal, no murmur, click, rub or gallop Abdomen: soft, non-tender; bowel sounds normal; no masses,  no organomegaly Pulses: 2+ and symmetric Skin: Skin color, texture, turgor normal. No rashes or lesions Lymph nodes: Cervical, supraclavicular, and axillary nodes normal.  Assessment and Plan:  Acute upper respiratory infections of unspecified site His throat was evaluated for streptococcal pharyngitis which was  negative by rapid testing. However he has a pustule on exam and multiple upper respiratory symptoms. We'll treat empirically with Augmentin x7 days. Continue to use Sudafed PE for the congestion ibuprofen and Tylenol for headache. Tussionex for cough.   Updated Medication List Outpatient Encounter Prescriptions as of 02/02/2013  Medication Sig Dispense Refill  . esomeprazole (NEXIUM) 40 MG capsule Take 1 capsule (40 mg total) by mouth daily before breakfast.  30 capsule  3  . zolpidem (AMBIEN) 10 MG tablet Take 1 tablet (10 mg total) by mouth at bedtime as needed.  30 tablet  5  . amoxicillin-clavulanate (AUGMENTIN) 875-125 MG per tablet Take 1 tablet by mouth 2 (two)  times daily.  14 tablet  0  . chlorpheniramine-HYDROcodone (TUSSIONEX) 10-8 MG/5ML LQCR Take 10 mLs by mouth every 12 (twelve) hours as needed.  240 mL  0  . levofloxacin (LEVAQUIN) 500 MG tablet Take 1 tablet (500 mg total) by mouth daily.  7 tablet  0  . oxyCODONE-acetaminophen (PERCOCET) 5-325 MG per tablet Take 1 tablet by mouth every 4 (four) hours as needed.        . predniSONE (STERAPRED UNI-PAK) 10 MG tablet 6 tablets on Day 1 , then reduce by 1 tablet daily until gone  21 tablet  0  . sucralfate (CARAFATE) 1 GM/10ML suspension Take 10 mLs (1 g total) by mouth 4 (four) times daily.  420 mL  0  . [DISCONTINUED] chlorpheniramine-HYDROcodone (TUSSIONEX) 10-8 MG/5ML LQCR Take 10 mLs by mouth every 12 (twelve) hours as needed.  240 mL  0  . [DISCONTINUED] pantoprazole (PROTONIX) 40 MG tablet Take 1 tablet (40 mg total) by mouth daily.  30 tablet  3  . [DISCONTINUED] pantoprazole (PROTONIX) 40 MG tablet Take 40 mg by mouth daily.       No facility-administered encounter medications on file as of 02/02/2013.     No orders of the defined types were placed in this encounter.    No Follow-up on file.

## 2013-02-02 NOTE — Telephone Encounter (Signed)
Needs 4:45 appt today for ear pain ,  Please schedule thanhks

## 2013-02-03 DIAGNOSIS — J069 Acute upper respiratory infection, unspecified: Secondary | ICD-10-CM | POA: Insufficient documentation

## 2013-02-03 NOTE — Assessment & Plan Note (Signed)
His throat was evaluated for streptococcal pharyngitis which was negative by rapid testing. However he has a pustule on exam and multiple upper respiratory symptoms. We'll treat empirically with Augmentin x7 days. Continue to use Sudafed PE for the congestion ibuprofen and Tylenol for headache. Tussionex for cough.

## 2013-02-23 ENCOUNTER — Other Ambulatory Visit: Payer: Self-pay | Admitting: Internal Medicine

## 2013-03-26 ENCOUNTER — Other Ambulatory Visit: Payer: Self-pay

## 2013-03-27 DIAGNOSIS — M189 Osteoarthritis of first carpometacarpal joint, unspecified: Secondary | ICD-10-CM

## 2013-03-27 HISTORY — DX: Osteoarthritis of first carpometacarpal joint, unspecified: M18.9

## 2013-04-18 ENCOUNTER — Encounter: Payer: Self-pay | Admitting: Internal Medicine

## 2013-04-20 ENCOUNTER — Other Ambulatory Visit: Payer: Self-pay | Admitting: Internal Medicine

## 2013-04-21 NOTE — Telephone Encounter (Signed)
Refill

## 2013-04-21 NOTE — Telephone Encounter (Signed)
Rx faxed to pharmacy  

## 2013-04-26 ENCOUNTER — Encounter: Payer: Self-pay | Admitting: Internal Medicine

## 2013-04-27 ENCOUNTER — Ambulatory Visit (INDEPENDENT_AMBULATORY_CARE_PROVIDER_SITE_OTHER): Payer: BC Managed Care – PPO | Admitting: Internal Medicine

## 2013-04-27 ENCOUNTER — Telehealth: Payer: Self-pay | Admitting: Internal Medicine

## 2013-04-27 ENCOUNTER — Encounter: Payer: Self-pay | Admitting: Internal Medicine

## 2013-04-27 VITALS — BP 140/84 | HR 77 | Temp 98.2°F | Resp 12 | Ht 70.5 in | Wt 222.5 lb

## 2013-04-27 DIAGNOSIS — R112 Nausea with vomiting, unspecified: Secondary | ICD-10-CM

## 2013-04-27 DIAGNOSIS — T65891A Toxic effect of other specified substances, accidental (unintentional), initial encounter: Secondary | ICD-10-CM

## 2013-04-27 DIAGNOSIS — Z23 Encounter for immunization: Secondary | ICD-10-CM

## 2013-04-27 DIAGNOSIS — T528X1A Toxic effect of other organic solvents, accidental (unintentional), initial encounter: Secondary | ICD-10-CM

## 2013-04-27 DIAGNOSIS — T524X1A Toxic effect of ketones, accidental (unintentional), initial encounter: Secondary | ICD-10-CM | POA: Insufficient documentation

## 2013-04-27 LAB — COMPREHENSIVE METABOLIC PANEL
ALT: 26 U/L (ref 0–53)
Alkaline Phosphatase: 60 U/L (ref 39–117)
Sodium: 138 mEq/L (ref 135–145)
Total Bilirubin: 0.7 mg/dL (ref 0.3–1.2)
Total Protein: 7.7 g/dL (ref 6.0–8.3)

## 2013-04-27 NOTE — Telephone Encounter (Signed)
Patient scheduled for acute visit at 10.30

## 2013-04-27 NOTE — Telephone Encounter (Signed)
Needing an appointment today. Has been exposed to a lot chemicals on new job feeling sick. Left message on nurse voice mail.

## 2013-04-27 NOTE — Assessment & Plan Note (Signed)
cmet and lipase pending. exam is normal. Exposure has resolved.

## 2013-04-27 NOTE — Progress Notes (Signed)
Patient ID: Seth Doyle, male   DOB: 1955-07-29, 57 y.o.   MRN: [id number]  Patient Active Problem List   Diagnosis Date Noted  . 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 Reflux esophagitis 09/10/2012  . Diverticulitis of sigmoid colon 02/04/2011    Subjective:  CC:   Chief Complaint  Patient presents with  . Acute Visit    HPI:   Seth Kihara Partinis a 57 y.o. male who presents Clearance to return to work.  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 With daily vomiting and dilated pupils accompanied by severe headaches.  All symptoms resolved when he stopped working at [employer]. He notes that the facility had no ventilation or exhaust fans.  .    Past Medical History  Diagnosis Date  . Diverticulitis     Past Surgical History  Procedure Laterality Date  . Rhinoplasty  1992    deviated septum due to volleyball  . Rotator cuff repair  1991    Dr. Ernest Doyle  . Shoulder arthroscopy w/ rotator cuff repair  1991    Seth Doyle  . Rhinoplasty  1992    foe deviated septm, traumatic  Seth Doyle   . Colon surgery  August 2012    sigmoid colectomy, diverticulitis  . Eye surgery      tear duct repair        The following portions of the patient's history were reviewed and updated as appropriate: Allergies, current medications, and problem list.    Review of Systems:   Patient denies headache, fevers, malaise, unintentional weight loss, skin rash, eye pain, sinus congestion and sinus pain, sore throat, dysphagia,  hemoptysis , cough, dyspnea, wheezing, chest pain, palpitations, orthopnea, edema, abdominal pain, nausea, melena, diarrhea, constipation, flank pain, dysuria, hematuria, urinary  Frequency, nocturia, numbness, tingling, seizures,  Focal weakness, Loss of consciousness,  Tremor, insomnia, depression, anxiety, and suicidal ideation.     History   Social History  . Marital  Status: Married    Spouse Name: N/A    Number of Children: N/A  . Years of Education: N/A   Occupational History  . Not on file.   Social History Main Topics  . Smoking status: Never Smoker   . Smokeless tobacco: Never Used  . Alcohol Use: Yes     Comment: rare  . Drug Use: No  . Sexual Activity: Yes   Other Topics Concern  . Not on file   Social History Narrative  . No narrative on file    Objective:  Filed Vitals:   04/27/13 1044  BP: 140/84  Pulse: 77  Temp: 98.2 F (36.8 C)  Resp: 12     General appearance: alert, cooperative and appears stated age Neck: no adenopathy, no carotid bruit, supple, symmetrical, trachea midline and thyroid not enlarged, symmetric, no tenderness/mass/nodules Back: symmetric, no curvature. ROM normal. No CVA tenderness. Lungs: clear to auscultation bilaterally Heart: regular rate and rhythm, S1, S2 normal, no murmur, click, rub or gallop Abdomen: soft, non-tender; bowel sounds normal; no masses,  no organomegaly Pulses: 2+ and symmetric Skin: Skin color, texture, turgor normal. No rashes or lesions Lymph nodes: Cervical, supraclavicular, and axillary nodes normal. Neuro: CNs 2-12 intact. DTRs 2+/4 in biceps, brachioradialis, patellars and achilles. Muscle strength 5/5 in upper and lower exremities. Fine resting tremor bilaterally both hands cerebellar function normal. Romberg negative.  No pronator drift.   Gait normal.   Assessment  and Plan:  Toxic effect of acetone cmet and lipase pending. exam is normal. Exposure has resolved.    Updated Medication List Outpatient Encounter Prescriptions as of 04/27/2013  Medication Sig  . NEXIUM 40 MG capsule take 1 capsule by mouth BEFORE BREAKFAST  . zolpidem (AMBIEN) 10 MG tablet take 1 tablet by mouth at bedtime if needed  . sucralfate (CARAFATE) 1 GM/10ML suspension Take 10 mLs (1 g total) by mouth 4 (four) times daily.  . [DISCONTINUED] amoxicillin-clavulanate (AUGMENTIN) 875-125 MG per  tablet Take 1 tablet by mouth 2 (two) times daily.  . [DISCONTINUED] chlorpheniramine-HYDROcodone (TUSSIONEX) 10-8 MG/5ML LQCR Take 10 mLs by mouth every 12 (twelve) hours as needed.  . [DISCONTINUED] levofloxacin (LEVAQUIN) 500 MG tablet Take 1 tablet (500 mg total) by mouth daily.  . [DISCONTINUED] oxyCODONE-acetaminophen (PERCOCET) 5-325 MG per tablet Take 1 tablet by mouth every 4 (four) hours as needed.    . [DISCONTINUED] predniSONE (STERAPRED UNI-PAK) 10 MG tablet 6 tablets on Day 1 , then reduce by 1 tablet daily until gone     Orders Placed This Encounter  Procedures  . Tdap vaccine greater than or equal to 7yo IM  . Comprehensive metabolic panel  . Lipase  . HM COLONOSCOPY    No Follow-up on file.

## 2013-04-27 NOTE — Telephone Encounter (Signed)
Last week was exposed to some chemicals, came home with severe headache , vomiting, exposed to acetone and would like to be cleared to return to work at previous job.

## 2013-04-28 ENCOUNTER — Encounter: Payer: Self-pay | Admitting: Internal Medicine

## 2013-04-30 NOTE — Telephone Encounter (Signed)
Mailed unread message to pt  

## 2013-07-21 ENCOUNTER — Encounter: Payer: Self-pay | Admitting: Internal Medicine

## 2013-07-21 ENCOUNTER — Ambulatory Visit (INDEPENDENT_AMBULATORY_CARE_PROVIDER_SITE_OTHER): Payer: BC Managed Care – PPO | Admitting: Internal Medicine

## 2013-07-21 VITALS — BP 116/76 | HR 89 | Temp 97.9°F | Resp 16 | Wt 221.0 lb

## 2013-07-21 DIAGNOSIS — K2990 Gastroduodenitis, unspecified, without bleeding: Principal | ICD-10-CM

## 2013-07-21 DIAGNOSIS — K2100 Gastro-esophageal reflux disease with esophagitis, without bleeding: Secondary | ICD-10-CM

## 2013-07-21 DIAGNOSIS — K2970 Gastritis, unspecified, without bleeding: Secondary | ICD-10-CM

## 2013-07-21 MED ORDER — FAMOTIDINE 40 MG PO TABS: 40.0000 mg | ORAL_TABLET | Freq: Every day | ORAL | Status: DC

## 2013-07-21 MED ORDER — SUCRALFATE 1 GM/10ML PO SUSP: 1.0000 g | Freq: Four times a day (QID) | ORAL | Status: DC

## 2013-07-21 NOTE — Progress Notes (Signed)
Patient ID: Seth Doyle, male   DOB: 1955-06-20, 58 y.o.   MRN: [id number]    Patient Active Problem List   Diagnosis Date Noted  . 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 Reflux esophagitis 09/10/2012  . Diverticulitis of sigmoid colon 02/04/2011    Subjective:  CC:   Chief Complaint  Patient presents with  . Acute Visit  . Gastrophageal Reflux    complaint    HPI:   Seth Doyle is a 58 y.o. male who presents for   Past Medical History  Diagnosis Date  . Diverticulitis     Past Surgical History  Procedure Laterality Date  . Rhinoplasty  1992    deviated septum due to volleyball  . Rotator cuff repair  1991    Dr. Marry Guan  . Shoulder arthroscopy w/ rotator cuff repair  1991    [facility]  . Rhinoplasty  1992    foe deviated septm, traumatic  [facility]   . Colon surgery  August 2012    sigmoid colectomy, diverticulitis  . Eye surgery      tear duct repair        The following portions of the patient's history were reviewed and updated as appropriate: Allergies, current medications, and problem list.    Review of Systems:   Patient denies headache, fevers, malaise, unintentional weight loss, skin rash, eye pain, sinus congestion and sinus pain, sore throat, dysphagia,  hemoptysis , cough, dyspnea, wheezing, chest pain, palpitations, orthopnea, edema, abdominal pain, nausea, melena, diarrhea, constipation, flank pain, dysuria, hematuria, urinary  Frequency, nocturia, numbness, tingling, seizures,  Focal weakness, Loss of consciousness,  Tremor, insomnia, depression, anxiety, and suicidal ideation.     History   Social History  . Marital Status: Married    Spouse Name: N/A    Number of Children: N/A  . Years of Education: N/A   Occupational History  . Not on file.   Social History Main Topics  . Smoking status: Never Smoker   . Smokeless tobacco: Never Used  . Alcohol Use: Yes     Comment: rare  . Drug Use: No  . Sexual Activity: Yes   Other  Topics Concern  . Not on file   Social History Narrative  . No narrative on file    Objective:  Filed Vitals:   07/21/13 1528  BP: 116/76  Pulse: 89  Temp: 97.9 F (36.6 C)  Resp: 16     General appearance: alert, cooperative and appears stated age Ears: normal TM's and external ear canals both ears Throat: lips, mucosa, and tongue normal; teeth and gums normal Neck: no adenopathy, no carotid bruit, supple, symmetrical, trachea midline and thyroid not enlarged, symmetric, no tenderness/mass/nodules Back: symmetric, no curvature. ROM normal. No CVA tenderness. Lungs: clear to auscultation bilaterally Heart: regular rate and rhythm, S1, S2 normal, no murmur, click, rub or gallop Abdomen: soft, non-tender; bowel sounds normal; no masses,  no organomegaly Pulses: 2+ and symmetric Skin: Skin color, texture, turgor normal. No rashes or lesions Lymph nodes: Cervical, supraclavicular, and axillary nodes normal.  Assessment and Plan:  Reflux esophagitis Occurring at night about twice a week precipitated by eating late. No unintentional wt loss or ominous signs indcuding dark stools.   Adding famotidine pre dinner.  Sucralfate prn. continue daily nexium in the am    Updated Medication List Outpatient Encounter Prescriptions as of 07/21/2013  Medication Sig  . NEXIUM 40 MG capsule take 1 capsule by mouth BEFORE BREAKFAST  . sucralfate (  CARAFATE) 1 GM/10ML suspension Take 10 mLs (1 g total) by mouth 4 (four) times daily.  Marland Kitchen zolpidem (AMBIEN) 10 MG tablet take 1 tablet by mouth at bedtime if needed  . [DISCONTINUED] sucralfate (CARAFATE) 1 GM/10ML suspension Take 10 mLs (1 g total) by mouth 4 (four) times daily.  . famotidine (PEPCID) 40 MG tablet Take 1 tablet (40 mg total) by mouth at bedtime.     No orders of the defined types were placed in this encounter.    No Follow-up on file.

## 2013-07-21 NOTE — Patient Instructions (Addendum)
I recommend taking famotidine before your evening meal to prevent esophagitis.    Continue daily morning Nexium.    If this does not help,  I recommend an upper GI study to rule out hiatal hernia

## 2013-07-21 NOTE — Assessment & Plan Note (Signed)
Occurring at night about twice a week precipitated by eating late.  Adding famotidine pre dinner.  Sucralfate prn. continue daily nexium in the am

## 2013-07-21 NOTE — Progress Notes (Signed)
Pre-visit discussion using our clinic review tool. No additional management support is needed unless otherwise documented below in the visit note.  

## 2013-08-28 ENCOUNTER — Encounter: Payer: Self-pay | Admitting: Internal Medicine

## 2013-08-28 DIAGNOSIS — M25512 Pain in left shoulder: Secondary | ICD-10-CM

## 2013-09-03 ENCOUNTER — Encounter: Payer: Self-pay | Admitting: Emergency Medicine

## 2013-09-16 ENCOUNTER — Ambulatory Visit: Payer: BC Managed Care – PPO | Admitting: Family Medicine

## 2013-09-18 ENCOUNTER — Ambulatory Visit (INDEPENDENT_AMBULATORY_CARE_PROVIDER_SITE_OTHER): Payer: BC Managed Care – PPO | Admitting: Family Medicine

## 2013-09-18 ENCOUNTER — Other Ambulatory Visit (INDEPENDENT_AMBULATORY_CARE_PROVIDER_SITE_OTHER): Payer: BC Managed Care – PPO

## 2013-09-18 ENCOUNTER — Encounter: Payer: Self-pay | Admitting: Family Medicine

## 2013-09-18 VITALS — BP 146/92 | HR 96 | Wt 220.0 lb

## 2013-09-18 DIAGNOSIS — M25512 Pain in left shoulder: Secondary | ICD-10-CM

## 2013-09-18 DIAGNOSIS — M25519 Pain in unspecified shoulder: Secondary | ICD-10-CM

## 2013-09-18 DIAGNOSIS — IMO0002 Reserved for concepts with insufficient information to code with codable children: Secondary | ICD-10-CM

## 2013-09-18 DIAGNOSIS — M75100 Unspecified rotator cuff tear or rupture of unspecified shoulder, not specified as traumatic: Secondary | ICD-10-CM

## 2013-09-18 DIAGNOSIS — M7550 Bursitis of unspecified shoulder: Secondary | ICD-10-CM | POA: Insufficient documentation

## 2013-09-18 HISTORY — DX: Bursitis of unspecified shoulder: M75.50

## 2013-09-18 MED ORDER — MELOXICAM 15 MG PO TABS: 15.0000 mg | ORAL_TABLET | Freq: Every day | ORAL | Status: DC

## 2013-09-18 NOTE — Assessment & Plan Note (Signed)
Injected under ultrasound today. Anti-inflammatories as stated in orders Home exercise program 3 times a week Discussed icing protocol Patient come back in 3-4 weeks for further evaluation.

## 2013-09-18 NOTE — Patient Instructions (Addendum)
Good to see you Exercises 3 times a week after workout.  Ice 20 minutes 2 times a day especially after a lot of activity. Stop heat for now.  meloxicam daily for 5 days then as needed.  50% of reps, increase 10 % each time you do shoulder thereafter.  See me again in 3-4 weeks.

## 2013-09-18 NOTE — Progress Notes (Signed)
[name] [address], [place] [address] Phone: [phone] Subjective:    I'm seeing this patient by the request  of:  TULLO,TERESA, MD   CC: Left shoulder pain  [id number] RAFI KENNETH is a 58 y.o. male coming in with complaint of left shoulder pain. He states he notices it with little activity. Patient has had this pain off and on but now seems to be more constant. States that there is no radiation is a very localized. Patient states that the pain is 6/10 in severity. Patient has tried ibuprofen without any significant improvement in does have some nighttime awakening. Denies any neck pain. Describes it but again is a catching sharp pain with a small dull aching pain afterwards. 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     Past medical history, social, surgical and family history all reviewed in electronic medical record.   Review of Systems: No headache, visual changes, nausea, vomiting, diarrhea, constipation, dizziness, abdominal pain, skin rash, fevers, chills, night sweats, weight loss, swollen lymph nodes, body aches, joint swelling, muscle aches, chest pain, shortness of breath, mood changes.   Objective Blood pressure 146/92, pulse 96, weight 220 lb (99.791 kg), SpO2 97.00%.  General: No apparent distress alert and oriented x3 mood and affect normal, dressed appropriately.  HEENT: Pupils equal, extraocular movements intact  Respiratory: Patient's speak in full sentences and does not appear short of breath  Cardiovascular: No lower extremity edema, non tender, no erythema  Skin: Warm dry intact with no signs of infection or rash on extremities or on axial skeleton.  Abdomen: Soft nontender  Neuro: Cranial nerves II through XII are intact, neurovascularly intact in all extremities with 2+ DTRs and 2+ pulses.  Lymph: No lymphadenopathy of posterior or anterior cervical chain or axillae  bilaterally.  Gait normal with good balance and coordination.  MSK:  Non tender with full range of motion and good stability and symmetric strength and tone of  elbows, wrist, hip, knee and ankles bilaterally.  Shoulder: Left Inspection reveals no abnormalities, atrophy or asymmetry. Palpation is normal with no tenderness over AC joint or bicipital groove. ROM is full in all planes. Rotator cuff strength normal throughout. No signs of impingement with negative Neer and Hawkin's tests, empty can sign. Speeds and Yergason's tests normal. No labral pathology noted with negative Obrien's, negative clunk and good stability. Normal scapular function observed. No painful arc and no drop arm sign. No apprehension sign  MSK US performed of: Left shoulder This study was ordered, performed, and interpreted by Charlann Boxer D.O.  Shoulder:   Supraspinatus:  Appears normal on long and transverse views, no bursal bulge seen with shoulder abduction on impingement view. Infraspinatus:  Appears normal on long and transverse views. Subscapularis:  Appears normal on long and transverse views. Patient does have impingement with bursitis noted on impingement view over the coracoid Teres Minor:  Appears normal on long and transverse views. AC joint:  Capsule undistended, no geyser sign. Glenohumeral Joint:  Appears normal without effusion. Glenoid Labrum:  Intact without visualized tears. Biceps Tendon:  Appears normal on long and transverse views, no fraying of tendon, tendon located in intertubercular groove, no subluxation with shoulder internal or external rotation. No increased power doppler signal.  Impression: Subacromial bursitis  Procedure: Real-time Ultrasound Guided Injection of left glenohumeral joint Device: GE Logiq E  Ultrasound guided injection is preferred based studies that show increased  duration, increased effect, greater accuracy, decreased procedural pain, increased response rate with  ultrasound guided versus blind injection.  Verbal informed consent obtained.  Time-out conducted.  Noted no overlying erythema, induration, or other signs of local infection.  Skin prepped in a sterile fashion.  Local anesthesia: Topical Ethyl chloride.  With sterile technique and under real time ultrasound guidance:  Joint visualized.  23g 1  inch needle inserted posterior approach. Pictures taken for needle placement. Patient did have injection of 2 cc of 1% lidocaine, 2 cc of 0.5% Marcaine, and 1cc of Kenalog 40 mg/dL. Completed without difficulty  Pain immediately resolved suggesting accurate placement of the medication.  Advised to call if fevers/chills, erythema, induration, drainage, or persistent bleeding.  Images permanently stored and available for review in the ultrasound unit.  Impression: Technically successful ultrasound guided injection.    Impression and Recommendations:     This case required medical decision making of moderate complexity.

## 2013-10-09 ENCOUNTER — Other Ambulatory Visit: Payer: Self-pay | Admitting: Internal Medicine

## 2013-10-09 ENCOUNTER — Encounter: Payer: Self-pay | Admitting: Family Medicine

## 2013-10-09 ENCOUNTER — Ambulatory Visit (INDEPENDENT_AMBULATORY_CARE_PROVIDER_SITE_OTHER): Payer: BC Managed Care – PPO | Admitting: Family Medicine

## 2013-10-09 VITALS — BP 140/86 | HR 74 | Wt 217.0 lb

## 2013-10-09 DIAGNOSIS — IMO0002 Reserved for concepts with insufficient information to code with codable children: Secondary | ICD-10-CM

## 2013-10-09 DIAGNOSIS — M7550 Bursitis of unspecified shoulder: Secondary | ICD-10-CM

## 2013-10-09 DIAGNOSIS — M75100 Unspecified rotator cuff tear or rupture of unspecified shoulder, not specified as traumatic: Secondary | ICD-10-CM

## 2013-10-09 MED ORDER — MELOXICAM 15 MG PO TABS: 15.0000 mg | ORAL_TABLET | Freq: Every day | ORAL | Status: DC

## 2013-10-09 NOTE — Assessment & Plan Note (Signed)
Patient has made some improvement. We discussed with patient again at great length. Patient's will continue with the exercises. Patient like to get more active we'll start him back at his regular routine at 50% and increase 10% a week. We discussed continuing to ice at the end of exercise. Patient was given phase II different exercises I think will be beneficial. We discussed topical anti-inflammatories as patient declined. Patient was given a refill meloxicam today. Patient will come back again in 4 weeks for further evaluation. At that time on the ultrasound to make sure that everything is healing well.  Spent greater than 25 minutes with patient face-to-face and had greater than 50% of counseling including as described above in assessment and plan.

## 2013-10-09 NOTE — Patient Instructions (Signed)
Good to see you 50% weight for now.  Increase 10% a week  Ice still after activity Continue the home exercises and the meloxicam See me again in 4 weeks and we will ultrasound to make sure fully healed or if need another injection.

## 2013-10-09 NOTE — Progress Notes (Signed)
  [name] [address], [place] [address] Phone: [phone] Subjective:     CC: Left shoulder pain, follow up  [id number] Seth Doyle is a 58 y.o. male coming in with complaint of left shoulder pain follow up. Patient diagnosis subacromial bursitis previously and did have an injection. Patient was given a home exercise program, icing regimen in patient states that he is approximately 5060% better. Patient states that he is able to do all activities but notices some catching from time to time. Patient states that he is resting comfortably. This is stopping him from any activities. Patient has been doing exercises on a regular basis and continues to take the meloxicam.    Past medical history, social, surgical and family history all reviewed in electronic medical record.   Review of Systems: No headache, visual changes, nausea, vomiting, diarrhea, constipation, dizziness, abdominal pain, skin rash, fevers, chills, night sweats, weight loss, swollen lymph nodes, body aches, joint swelling, muscle aches, chest pain, shortness of breath, mood changes.   Objective Blood pressure 140/86, pulse 74, weight 217 lb (98.431 kg), SpO2 98.00%.  General: No apparent distress alert and oriented x3 mood and affect normal, dressed appropriately.  HEENT: Pupils equal, extraocular movements intact  Respiratory: Patient's speak in full sentences and does not appear short of breath  Cardiovascular: No lower extremity edema, non tender, no erythema  Skin: Warm dry intact with no signs of infection or rash on extremities or on axial skeleton.  Abdomen: Soft nontender  Neuro: Cranial nerves II through XII are intact, neurovascularly intact in all extremities with 2+ DTRs and 2+ pulses.  Lymph: No lymphadenopathy of posterior or anterior cervical chain or axillae bilaterally.  Gait normal with good balance and coordination.  MSK:  Non tender with full range of  motion and good stability and symmetric strength and tone of  elbows, wrist, hip, knee and ankles bilaterally.  Shoulder: Left Inspection reveals no abnormalities, atrophy or asymmetry. Palpation is normal with no tenderness over AC joint or bicipital groove. ROM is full in all planes. Rotator cuff strength normal throughout. Mild signs of impingement with positive Neer and Hawkin's tests, empty can sign. Speeds and Yergason's tests normal. No labral pathology noted with negative Obrien's, negative clunk and good stability. Normal scapular function observed. No painful arc and no drop arm sign. No apprehension sign  Neck: Inspection unremarkable. No palpable stepoffs. Negative Spurling's maneuver. Full neck range of motion Grip strength and sensation normal in bilateral hands Strength good C4 to T1 distribution No sensory change to C4 to T1 Negative Hoffman sign bilaterally Reflexes normal    Impression and Recommendations:     This case required medical decision making of moderate complexity.

## 2013-10-16 ENCOUNTER — Telehealth: Payer: Self-pay | Admitting: Family Medicine

## 2013-10-16 NOTE — Telephone Encounter (Signed)
Give it the weekend and if not better by Monday then come in sooner.

## 2013-10-16 NOTE — Telephone Encounter (Signed)
Discussed with pt

## 2013-10-16 NOTE — Telephone Encounter (Signed)
Spoke to pt, he states this morning he was benching light weight. He felt popping in left shoulder with every move & the popping is painful. He has been icing his shoulder regularly but after popping the pain in the shoulder is worse than it was in the beginning. Pt wants to know if he should come in sooner than 6.19.15.

## 2013-10-16 NOTE — Telephone Encounter (Signed)
Patient called requesting to speak with Ria Comment about his shoulder symptoms. Wants to know if he should come in sooner than scheduled. Please advise.

## 2013-10-30 ENCOUNTER — Telehealth: Payer: Self-pay

## 2013-10-30 ENCOUNTER — Ambulatory Visit (INDEPENDENT_AMBULATORY_CARE_PROVIDER_SITE_OTHER): Payer: BC Managed Care – PPO | Admitting: Family Medicine

## 2013-10-30 ENCOUNTER — Other Ambulatory Visit (INDEPENDENT_AMBULATORY_CARE_PROVIDER_SITE_OTHER): Payer: BC Managed Care – PPO

## 2013-10-30 ENCOUNTER — Ambulatory Visit (INDEPENDENT_AMBULATORY_CARE_PROVIDER_SITE_OTHER)
Admission: RE | Admit: 2013-10-30 | Discharge: 2013-10-30 | Disposition: A | Discharge status: Home / Self Care | Payer: BC Managed Care – PPO | Source: Ambulatory Visit | Attending: Family Medicine | Admitting: Family Medicine

## 2013-10-30 ENCOUNTER — Encounter: Payer: Self-pay | Admitting: Family Medicine

## 2013-10-30 VITALS — BP 124/84 | HR 77 | Wt 215.0 lb

## 2013-10-30 DIAGNOSIS — M25519 Pain in unspecified shoulder: Secondary | ICD-10-CM

## 2013-10-30 DIAGNOSIS — M25512 Pain in left shoulder: Secondary | ICD-10-CM

## 2013-10-30 DIAGNOSIS — M75102 Unspecified rotator cuff tear or rupture of left shoulder, not specified as traumatic: Secondary | ICD-10-CM | POA: Insufficient documentation

## 2013-10-30 DIAGNOSIS — S43429A Sprain of unspecified rotator cuff capsule, initial encounter: Secondary | ICD-10-CM

## 2013-10-30 HISTORY — DX: Unspecified rotator cuff tear or rupture of left shoulder, not specified as traumatic: M75.102

## 2013-10-30 MED ORDER — TRAMADOL HCL 50 MG PO TABS: 50.0000 mg | ORAL_TABLET | Freq: Three times a day (TID) | ORAL | Status: DC | PRN

## 2013-10-30 MED ORDER — NITROGLYCERIN 0.2 MG/HR TD PT24: MEDICATED_PATCH | TRANSDERMAL | Status: DC

## 2013-10-30 NOTE — Assessment & Plan Note (Signed)
Patient does have a partial tear of the rotator cuff today and may even have a labral tear. Patient does have new symptoms and new physical exam findings as well. I do feel that this patient does have a labral tear he is likely not going to heal very well with conservative therapy. I believe that further imaging is necessary. X-rays and MRI have been ordered. We discussed in the interim patient is concerned nitroglycerin patches as well. Patient was warned the potential side effects. Patient will try this and continue the home exercise program. Patient will come back in one to 2 days after the MRI and will discuss further treatment options.  Spent greater than 25 minutes with patient face-to-face and had greater than 50% of counseling including as described above in assessment and plan.

## 2013-10-30 NOTE — Telephone Encounter (Signed)
The patient called and is hoping to get an rx for pain medicine due his pain symptoms.   Callback - [phone]

## 2013-10-30 NOTE — Telephone Encounter (Signed)
Tramadol up to Q8 hours #50

## 2013-10-30 NOTE — Progress Notes (Signed)
[facility] [address], [place] [address] Phone: [phone] Subjective:     CC: Left shoulder pain, follow up  [id number] Seth Doyle is a 58 y.o. male coming in with complaint of left shoulder pain follow up. Patient diagnosis subacromial bursitis previously and did have an injection. Patient is doing home exercises, icing, as well as sent to formal physical therapy. Patient states unfortunately he has not made any significant improvement. Patient went to try to lift even light weights and felt a pop in his left shoulder that did cause him significant discomfort. Patient actually wore a sling for multiple days until it felt better. Patient states now he is doing better but he is not playing golf or doing any lifting which is affecting his daily activities. Patient would like to be much more active. Patient states it is more of a dull aching pain beginning in wake him up at night.    Past medical history, social, surgical and family history all reviewed in electronic medical record.   Review of Systems: No headache, visual changes, nausea, vomiting, diarrhea, constipation, dizziness, abdominal pain, skin rash, fevers, chills, night sweats, weight loss, swollen lymph nodes, body aches, joint swelling, muscle aches, chest pain, shortness of breath, mood changes.   Objective Blood pressure 124/84, pulse 77, weight 215 lb (97.523 kg), SpO2 96.00%.  General: No apparent distress alert and oriented x3 mood and affect normal, dressed appropriately.  HEENT: Pupils equal, extraocular movements intact  Respiratory: Patient's speak in full sentences and does not appear short of breath  Cardiovascular: No lower extremity edema, non tender, no erythema  Skin: Warm dry intact with no signs of infection or rash on extremities or on axial skeleton.  Abdomen: Soft nontender  Neuro: Cranial nerves II through XII are intact, neurovascularly intact in all  extremities with 2+ DTRs and 2+ pulses.  Lymph: No lymphadenopathy of posterior or anterior cervical chain or axillae bilaterally.  Gait normal with good balance and coordination.  MSK:  Non tender with full range of motion and good stability and symmetric strength and tone of  elbows, wrist, hip, knee and ankles bilaterally.  Shoulder: Left Inspection reveals no abnormalities, atrophy or asymmetry. Palpation is normal with no tenderness over AC joint or bicipital groove. ROM is full in all planes. Rotator cuff strength normal throughout but does have pain Mild signs of impingement with positive Neer and Hawkin's tests, empty can sign. Speeds and Yergason's tests normal. Positive labral pathology noted with positive Obrien's, negative clunk and good stability. Normal scapular function observed. No painful arc and no drop arm sign. No apprehension sign  Neck: Inspection unremarkable. No palpable stepoffs. Negative Spurling's maneuver. Full neck range of motion Grip strength and sensation normal in bilateral hands Strength good C4 to T1 distribution No sensory change to C4 to T1 Negative Hoffman sign bilaterally Reflexes normal  MSK US performed of: Left shoulder This study was ordered, performed, and interpreted by Seth Doyle D.O.  Shoulder:   Supraspinatus:  Patient's tendon does have appears to be a insertional tear that seems to be mostly intrasubstance with no retraction. This is new from previous exam. Subscapularis:  Appears normal on long and transverse views. Positive bulge noted to. Teres Minor:  Appears normal on long and transverse views. AC joint:  Capsule undistended, no geyser sign. Glenohumeral Joint:  Appears normal without effusion. Glenoid Labrum: Patient's labrum does appear to have significant amount thickening which could be secondary to  a tear. Difficult to assess. Biceps Tendon:  Appears normal on long and transverse views, no fraying of tendon, tendon  located in intertubercular groove, no subluxation with shoulder internal or external rotation. No increased power doppler signal.  Impression: New rotator cuff tear partial of the supraspinatus with possible labral tear.     Impression and Recommendations:     This case required medical decision making of moderate complexity.

## 2013-10-30 NOTE — Patient Instructions (Signed)
Good to see you Sorry you are still in pain 2 xrays downstairs today.  Then they will call you to schedule the MRI.   Come back 1-2 days after the MRI and we will discuss results.  Still Ice. Nitroglycerin Protocol   Apply 1/4 nitroglycerin patch to affected area daily.  Change position of patch within the affected area every 24 hours.  You may experience a headache during the first 1-2 weeks of using the patch, these should subside.  If you experience headaches after beginning nitroglycerin patch treatment, you may take your preferred over the counter pain reliever.  Another side effect of the nitroglycerin patch is skin irritation or rash related to patch adhesive.  Please notify our office if you develop more severe headaches or rash, and stop the patch.  Tendon healing with nitroglycerin patch may require 12 to 24 weeks depending on the extent of injury.  Men should not use if taking Viagra, Cialis, or Levitra.   Do not use if you have migraines or rosacea.

## 2013-10-30 NOTE — Telephone Encounter (Signed)
Refill done. Pt made aware.

## 2013-11-06 ENCOUNTER — Ambulatory Visit: Payer: BC Managed Care – PPO | Admitting: Family Medicine

## 2013-11-13 ENCOUNTER — Ambulatory Visit: Payer: Self-pay | Admitting: Internal Medicine

## 2013-11-13 ENCOUNTER — Ambulatory Visit: Payer: BC Managed Care – PPO | Admitting: Family Medicine

## 2013-11-13 ENCOUNTER — Other Ambulatory Visit: Payer: Self-pay | Admitting: Internal Medicine

## 2013-11-13 DIAGNOSIS — M7552 Bursitis of left shoulder: Secondary | ICD-10-CM

## 2013-11-13 DIAGNOSIS — M75102 Unspecified rotator cuff tear or rupture of left shoulder, not specified as traumatic: Secondary | ICD-10-CM

## 2013-11-14 ENCOUNTER — Other Ambulatory Visit: Payer: Self-pay | Admitting: Internal Medicine

## 2013-11-16 NOTE — Telephone Encounter (Signed)
Ok to fill 

## 2013-11-17 NOTE — Telephone Encounter (Signed)
Ready to fax  

## 2013-11-17 NOTE — Telephone Encounter (Signed)
Ok to refill,  printed rx  

## 2013-11-18 ENCOUNTER — Ambulatory Visit (INDEPENDENT_AMBULATORY_CARE_PROVIDER_SITE_OTHER): Payer: BC Managed Care – PPO | Admitting: Family Medicine

## 2013-11-18 ENCOUNTER — Encounter: Payer: Self-pay | Admitting: Family Medicine

## 2013-11-18 VITALS — BP 150/92 | HR 73 | Ht 70.5 in | Wt 218.0 lb

## 2013-11-18 DIAGNOSIS — S43429A Sprain of unspecified rotator cuff capsule, initial encounter: Secondary | ICD-10-CM

## 2013-11-18 DIAGNOSIS — M75102 Unspecified rotator cuff tear or rupture of left shoulder, not specified as traumatic: Secondary | ICD-10-CM

## 2013-11-18 NOTE — Assessment & Plan Note (Addendum)
Discussed with patient at great length. We discussed different treatment options including another corticosteroid injection in trying another round of formal physical therapy. Patient has declined and would like more of a surgical option. I will refer patient to Dr. Tamera Punt and [facility] for further evaluation and treatment. MRI once again showed the patient is a superior labral tear with interstitial tearing of the supraspinatus and subscapularis with no retraction and moderate osteoarthritic changes. Patient will be bringing a from the MRI from [facility].  Patient is here for any other questions or concerns.

## 2013-11-18 NOTE — Patient Instructions (Addendum)
Good to see you Ice still will help.  Dr. Malena Catholic ([facility]) [phone] I am here if you have any questions or need anything else.

## 2013-11-18 NOTE — Progress Notes (Signed)
  [facility] [place], [place] [address] Phone: [phone] Subjective:     CC: Left shoulder pain, follow up  [id number] Seth Doyle is a 58 y.o. male coming in with complaint of left shoulder pain follow up. Patient diagnosis subacromial bursitis previously and did have an injection. Was seen previously and there were some intersubstance tear seen on ultrasound of the rotator cuff as well as labral tear. Patient was sent for an MRI for further evaluation. MRI was reviewed. Patient does show that he had a superior labral tear as well as intersubstance tearing of the rotator cuff with moderate osteoarthritic changes. Very similar to the ultrasound pictures. Patient states at this time he has not made any significant improvement. Patient was unable to tolerate the nitroglycerin patch. Patient is hurting all the time and is no longer able to play golf or do any of his lifting secondary to pain. This is affecting all his daily activities. Patient states the pain does wake him up at night. Patient has failed all conservative therapy at this time.    Past medical history, social, surgical and family history all reviewed in electronic medical record.   Review of Systems: No headache, visual changes, nausea, vomiting, diarrhea, constipation, dizziness, abdominal pain, skin rash, fevers, chills, night sweats, weight loss, swollen lymph nodes, body aches, joint swelling, muscle aches, chest pain, shortness of breath, mood changes.   Objective Blood pressure 150/92, pulse 73, height 5' 10.5" (1.791 m), weight 218 lb (98.884 kg), SpO2 98.00%.  General: No apparent distress alert and oriented x3 mood and affect normal, dressed appropriately.  HEENT: Pupils equal, extraocular movements intact  Respiratory: Patient's speak in full sentences and does not appear short of breath  Cardiovascular: No lower extremity edema, non tender, no erythema  Skin: Warm dry  intact with no signs of infection or rash on extremities or on axial skeleton.  Abdomen: Soft nontender  Neuro: Cranial nerves II through XII are intact, neurovascularly intact in all extremities with 2+ DTRs and 2+ pulses.  Lymph: No lymphadenopathy of posterior or anterior cervical chain or axillae bilaterally.  Gait normal with good balance and coordination.  MSK:  Non tender with full range of motion and good stability and symmetric strength and tone of  elbows, wrist, hip, knee and ankles bilaterally.  Shoulder: Left Inspection reveals no abnormalities, atrophy or asymmetry. Palpation is normal with no tenderness over AC joint or bicipital groove. ROM is full in all planes. Rotator cuff has 4/5 strength compared to the contralateral side signs of impingement with positive Neer and Hawkin's tests, empty can sign. Speeds and Yergason's tests normal. Positive labral pathology noted with positive Obrien's, negative clunk and good stability. Normal scapular function observed. No painful arc and no drop arm sign. No apprehension sign  Neck: Inspection unremarkable. No palpable stepoffs. Negative Spurling's maneuver. Full neck range of motion Grip strength and sensation normal in bilateral hands Strength good C4 to T1 distribution No sensory change to C4 to T1 Negative Hoffman sign bilaterally Reflexes normal     Impression and Recommendations:     This case required medical decision making of moderate complexity.

## 2014-01-06 ENCOUNTER — Encounter: Payer: Self-pay | Admitting: Internal Medicine

## 2014-03-14 ENCOUNTER — Other Ambulatory Visit: Payer: Self-pay | Admitting: Internal Medicine

## 2014-03-23 ENCOUNTER — Other Ambulatory Visit: Payer: Self-pay | Admitting: Internal Medicine

## 2014-03-24 NOTE — Telephone Encounter (Signed)
Ok to refill,  printed rx . Needs OV

## 2014-03-24 NOTE — Telephone Encounter (Signed)
Left message on VM will need f/u for further refills

## 2014-03-24 NOTE — Telephone Encounter (Signed)
Faxed to pharmacy

## 2014-03-24 NOTE — Telephone Encounter (Signed)
Last OV 3.3.15, last refill 10.4.15.  Please advise refill.

## 2014-03-25 ENCOUNTER — Encounter: Payer: Self-pay | Admitting: Internal Medicine

## 2014-03-26 ENCOUNTER — Telehealth: Payer: Self-pay | Admitting: Internal Medicine

## 2014-03-26 DIAGNOSIS — F5104 Psychophysiologic insomnia: Secondary | ICD-10-CM | POA: Insufficient documentation

## 2014-03-26 HISTORY — DX: Psychophysiologic insomnia: F51.04

## 2014-04-11 ENCOUNTER — Encounter: Payer: Self-pay | Admitting: Internal Medicine

## 2014-04-26 ENCOUNTER — Encounter: Payer: Self-pay | Admitting: Internal Medicine

## 2014-04-26 ENCOUNTER — Ambulatory Visit (INDEPENDENT_AMBULATORY_CARE_PROVIDER_SITE_OTHER): Payer: BC Managed Care – PPO | Admitting: Internal Medicine

## 2014-04-26 VITALS — BP 140/98 | HR 72 | Temp 98.2°F | Resp 16 | Ht 70.5 in | Wt 220.5 lb

## 2014-04-26 DIAGNOSIS — I10 Essential (primary) hypertension: Secondary | ICD-10-CM

## 2014-04-26 DIAGNOSIS — K2100 Gastro-esophageal reflux disease with esophagitis, without bleeding: Secondary | ICD-10-CM

## 2014-04-26 DIAGNOSIS — F5104 Psychophysiologic insomnia: Secondary | ICD-10-CM

## 2014-04-26 DIAGNOSIS — E559 Vitamin D deficiency, unspecified: Secondary | ICD-10-CM

## 2014-04-26 DIAGNOSIS — N2889 Other specified disorders of kidney and ureter: Secondary | ICD-10-CM

## 2014-04-26 DIAGNOSIS — M75102 Unspecified rotator cuff tear or rupture of left shoulder, not specified as traumatic: Secondary | ICD-10-CM

## 2014-04-26 DIAGNOSIS — Z125 Encounter for screening for malignant neoplasm of prostate: Secondary | ICD-10-CM

## 2014-04-26 DIAGNOSIS — Z0000 Encounter for general adult medical examination without abnormal findings: Secondary | ICD-10-CM

## 2014-04-26 DIAGNOSIS — N401 Enlarged prostate with lower urinary tract symptoms: Secondary | ICD-10-CM

## 2014-04-26 DIAGNOSIS — R5383 Other fatigue: Secondary | ICD-10-CM

## 2014-04-26 DIAGNOSIS — R338 Other retention of urine: Secondary | ICD-10-CM

## 2014-04-26 DIAGNOSIS — F4323 Adjustment disorder with mixed anxiety and depressed mood: Secondary | ICD-10-CM

## 2014-04-26 DIAGNOSIS — K2990 Gastroduodenitis, unspecified, without bleeding: Secondary | ICD-10-CM

## 2014-04-26 DIAGNOSIS — R3911 Hesitancy of micturition: Secondary | ICD-10-CM

## 2014-04-26 DIAGNOSIS — F329 Major depressive disorder, single episode, unspecified: Secondary | ICD-10-CM

## 2014-04-26 DIAGNOSIS — G4700 Insomnia, unspecified: Secondary | ICD-10-CM

## 2014-04-26 DIAGNOSIS — K2970 Gastritis, unspecified, without bleeding: Secondary | ICD-10-CM

## 2014-04-26 DIAGNOSIS — F32A Depression, unspecified: Secondary | ICD-10-CM

## 2014-04-26 LAB — POCT URINALYSIS DIPSTICK
Bilirubin, UA: NEGATIVE
Glucose, UA: NEGATIVE
Ketones, UA: NEGATIVE
Leukocytes, UA: NEGATIVE
Nitrite, UA: NEGATIVE
Protein, UA: NEGATIVE
SPEC GRAV UA: 1.015
Urobilinogen, UA: 0.2
pH, UA: 5.5

## 2014-04-26 MED ORDER — ESOMEPRAZOLE MAGNESIUM 40 MG PO CPDR: 40.0000 mg | DELAYED_RELEASE_CAPSULE | Freq: Two times a day (BID) | ORAL | Status: DC

## 2014-04-26 MED ORDER — DOXAZOSIN MESYLATE 1 MG PO TABS: 1.0000 mg | ORAL_TABLET | Freq: Every day | ORAL | Status: DC

## 2014-04-26 MED ORDER — BUPROPION HCL ER (SR) 100 MG PO TB12: 100.0000 mg | ORAL_TABLET | Freq: Two times a day (BID) | ORAL | Status: DC

## 2014-04-26 NOTE — Progress Notes (Signed)
Pre-visit discussion using our clinic review tool. No additional management support is needed unless otherwise documented below in the visit note.  

## 2014-04-26 NOTE — Patient Instructions (Signed)
Your prostate exam was normal  We are starting cardura for the urinary issue and the blood pressure,  1 mg daily   We are starting wellbutrin for the depression.  Twice daily dosing,  Early am and early afternoon  Increase your nexium to two times daily .  If the GI symptoms don't improve,  Will refer to GI   We will count today as your physical  Return ASAP for fasting labs
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
## 2014-04-27 DIAGNOSIS — I10 Essential (primary) hypertension: Secondary | ICD-10-CM

## 2014-04-27 DIAGNOSIS — Z0001 Encounter for general adult medical examination with abnormal findings: Secondary | ICD-10-CM | POA: Insufficient documentation

## 2014-04-27 DIAGNOSIS — Z0000 Encounter for general adult medical examination without abnormal findings: Secondary | ICD-10-CM | POA: Insufficient documentation

## 2014-04-27 DIAGNOSIS — Z8679 Personal history of other diseases of the circulatory system: Secondary | ICD-10-CM | POA: Insufficient documentation

## 2014-04-27 DIAGNOSIS — N401 Enlarged prostate with lower urinary tract symptoms: Secondary | ICD-10-CM | POA: Insufficient documentation

## 2014-04-27 DIAGNOSIS — R338 Other retention of urine: Secondary | ICD-10-CM

## 2014-04-27 DIAGNOSIS — F4323 Adjustment disorder with mixed anxiety and depressed mood: Secondary | ICD-10-CM | POA: Insufficient documentation

## 2014-04-27 HISTORY — DX: Essential (primary) hypertension: I10

## 2014-04-27 HISTORY — DX: Benign prostatic hyperplasia with lower urinary tract symptoms: N40.1

## 2014-04-27 NOTE — Assessment & Plan Note (Signed)
Based on history and exam today.  UA normal,  Trial of cardura 1 mg daily . PSA pending

## 2014-04-27 NOTE — Assessment & Plan Note (Signed)
Annual  wellness  exam was done as well as a comprehensive physical exam and management of acute and chronic conditions .  During the course of the visit the patient was educated and counseled about appropriate screening and preventive services including : fall prevention , diabetes screening, nutrition counseling, colorectal cancer screening, and recommended immunizations.  Printed recommendations for health maintenance screenings was given.  

## 2014-04-27 NOTE — Assessment & Plan Note (Signed)
New diagnosis ,  Trial of cardura,  Renal function and ua pending for protein.

## 2014-04-27 NOTE — Assessment & Plan Note (Signed)
S/p surgical repair, now with improved range of motion and strength

## 2014-04-27 NOTE — Assessment & Plan Note (Signed)
Persistent, managed with ambien.

## 2014-04-27 NOTE — Assessment & Plan Note (Signed)
With gastritis nd occasional vomitibg. Increase nexium to twice daily.  GI consult if not improved.  Checking H Pylori

## 2014-04-27 NOTE — Assessment & Plan Note (Signed)
He has a history of adverse reaction to effexor, which caused worsening depression.  Trial of wellbutrin disucssed trodya and agreed to.  Wife is Therapist, sports, Return in one month

## 2014-04-30 ENCOUNTER — Other Ambulatory Visit (INDEPENDENT_AMBULATORY_CARE_PROVIDER_SITE_OTHER): Payer: BC Managed Care – PPO

## 2014-04-30 DIAGNOSIS — K2970 Gastritis, unspecified, without bleeding: Secondary | ICD-10-CM

## 2014-04-30 DIAGNOSIS — E559 Vitamin D deficiency, unspecified: Secondary | ICD-10-CM

## 2014-04-30 DIAGNOSIS — F329 Major depressive disorder, single episode, unspecified: Secondary | ICD-10-CM

## 2014-04-30 DIAGNOSIS — Z125 Encounter for screening for malignant neoplasm of prostate: Secondary | ICD-10-CM

## 2014-04-30 DIAGNOSIS — R5383 Other fatigue: Secondary | ICD-10-CM

## 2014-04-30 DIAGNOSIS — I10 Essential (primary) hypertension: Secondary | ICD-10-CM

## 2014-04-30 DIAGNOSIS — K2990 Gastroduodenitis, unspecified, without bleeding: Secondary | ICD-10-CM

## 2014-04-30 DIAGNOSIS — F32A Depression, unspecified: Secondary | ICD-10-CM

## 2014-04-30 LAB — CBC WITH DIFFERENTIAL/PLATELET
BASOS ABS: 0 10*3/uL (ref 0.0–0.1)
Basophils Relative: 0.4 % (ref 0.0–3.0)
EOS PCT: 1.4 % (ref 0.0–5.0)
Eosinophils Absolute: 0.1 10*3/uL (ref 0.0–0.7)
HCT: 42.7 % (ref 39.0–52.0)
Hemoglobin: 14.3 g/dL (ref 13.0–17.0)
LYMPHS ABS: 1.4 10*3/uL (ref 0.7–4.0)
Lymphocytes Relative: 26.2 % (ref 12.0–46.0)
MCHC: 33.5 g/dL (ref 30.0–36.0)
MCV: 88.7 fl (ref 78.0–100.0)
Monocytes Absolute: 0.5 10*3/uL (ref 0.1–1.0)
Monocytes Relative: 10 % (ref 3.0–12.0)
NEUTROS PCT: 62 % (ref 43.0–77.0)
Neutro Abs: 3.4 10*3/uL (ref 1.4–7.7)
PLATELETS: 190 10*3/uL (ref 150.0–400.0)
RBC: 4.81 Mil/uL (ref 4.22–5.81)
RDW: 12.9 % (ref 11.5–15.5)
WBC: 5.4 10*3/uL (ref 4.0–10.5)

## 2014-04-30 LAB — COMPREHENSIVE METABOLIC PANEL
ALBUMIN: 4.1 g/dL (ref 3.5–5.2)
ALT: 22 U/L (ref 0–53)
AST: 30 U/L (ref 0–37)
Alkaline Phosphatase: 57 U/L (ref 39–117)
BUN: 14 mg/dL (ref 6–23)
CO2: 25 mEq/L (ref 19–32)
Calcium: 9.2 mg/dL (ref 8.4–10.5)
Chloride: 107 mEq/L (ref 96–112)
Creatinine, Ser: 1.2 mg/dL (ref 0.4–1.5)
GFR: 66.39 mL/min (ref >60.00)
GLUCOSE: 101 mg/dL — AB (ref 70–99)
POTASSIUM: 4.1 meq/L (ref 3.5–5.1)
SODIUM: 137 meq/L (ref 135–145)
TOTAL PROTEIN: 6.5 g/dL (ref 6.0–8.3)
Total Bilirubin: 1 mg/dL (ref 0.2–1.2)

## 2014-04-30 LAB — VITAMIN D 25 HYDROXY (VIT D DEFICIENCY, FRACTURES): VITD: 21.03 ng/mL — ABNORMAL LOW (ref 30.00–100.00)

## 2014-04-30 LAB — LIPID PANEL
CHOLESTEROL: 137 mg/dL (ref 0–200)
HDL: 35.2 mg/dL — AB (ref >39.00)
LDL Cholesterol: 87 mg/dL (ref 0–99)
NonHDL: 101.8
TRIGLYCERIDES: 75 mg/dL (ref 0.0–149.0)
Total CHOL/HDL Ratio: 4
VLDL: 15 mg/dL (ref 0.0–40.0)

## 2014-04-30 LAB — PSA: PSA: 0.74 ng/mL (ref 0.10–4.00)

## 2014-04-30 LAB — TSH: TSH: 1.45 u[IU]/mL (ref 0.35–4.50)

## 2014-04-30 LAB — MICROALBUMIN / CREATININE URINE RATIO
CREATININE, U: 169.3 mg/dL
MICROALB UR: 0.4 mg/dL (ref 0.0–1.9)
MICROALB/CREAT RATIO: 0.2 mg/g (ref 0.0–30.0)

## 2014-04-30 NOTE — Addendum Note (Signed)
Addended by: Johnsie Cancel on: 04/30/2014 08:08 AM   Modules accepted: Orders

## 2014-05-02 ENCOUNTER — Encounter: Payer: Self-pay | Admitting: Internal Medicine

## 2014-05-02 ENCOUNTER — Other Ambulatory Visit: Payer: Self-pay | Admitting: Internal Medicine

## 2014-05-02 DIAGNOSIS — E559 Vitamin D deficiency, unspecified: Secondary | ICD-10-CM | POA: Insufficient documentation

## 2014-05-02 MED ORDER — ERGOCALCIFEROL 1.25 MG (50000 UT) PO CAPS: 50000.0000 [IU] | ORAL_CAPSULE | ORAL | Status: DC

## 2014-05-03 LAB — HELICOBACTER PYLORI ABS-IGG+IGA, BLD
H PYLORI IGG: 0.65 {ISR}
HELICOBACTER PYLORI AB, IGA: 7.2 U/mL (ref <9.0)

## 2014-05-04 ENCOUNTER — Encounter: Payer: Self-pay | Admitting: Internal Medicine

## 2014-05-07 ENCOUNTER — Telehealth: Payer: Self-pay | Admitting: *Deleted

## 2014-05-07 NOTE — Telephone Encounter (Signed)
[facility], [facility] approved for Nexium through 05/07/15.

## 2014-05-10 ENCOUNTER — Encounter: Payer: Self-pay | Admitting: Internal Medicine

## 2014-05-11 ENCOUNTER — Other Ambulatory Visit: Payer: Self-pay | Admitting: Internal Medicine

## 2014-05-11 DIAGNOSIS — R110 Nausea: Secondary | ICD-10-CM

## 2014-05-13 ENCOUNTER — Ambulatory Visit: Payer: Self-pay | Admitting: Internal Medicine

## 2014-05-18 ENCOUNTER — Telehealth: Payer: Self-pay | Admitting: Internal Medicine

## 2014-05-18 DIAGNOSIS — K2100 Gastro-esophageal reflux disease with esophagitis, without bleeding: Secondary | ICD-10-CM

## 2014-05-18 DIAGNOSIS — E278 Other specified disorders of adrenal gland: Secondary | ICD-10-CM

## 2014-05-18 MED ORDER — SUCRALFATE 1 GM/10ML PO SUSP: ORAL | Status: DC

## 2014-05-22 NOTE — Assessment & Plan Note (Signed)
With recurrent severe episodes despite maximal use of PPI.  Seth Doyle  abd ultrasound showed a normal GB, no stones .  Referral to [facility] for EGD

## 2014-06-09 ENCOUNTER — Telehealth: Payer: Self-pay

## 2014-06-09 MED ORDER — CLINDAMYCIN PHOSPHATE 1 % EX GEL: Freq: Two times a day (BID) | CUTANEOUS | Status: DC

## 2014-06-09 NOTE — Telephone Encounter (Signed)
The patient states he has a rash that re-occurs on his arm periodically.  He states he has been seen by a dermatologist many times for this and is prescribed a clindamycin gel cream that he uses each time, and it works on clearing up the rash.  He is hoping to receive a refill on this medication through [facility].  (he called his derm dr and he can not get in for this refill until may)

## 2014-06-09 NOTE — Telephone Encounter (Signed)
Please advise 

## 2014-06-09 NOTE — Telephone Encounter (Signed)
SENT GEL RX TO PHARMACY

## 2014-06-10 NOTE — Telephone Encounter (Signed)
Patient notified and follow up appointment made for after endoscopy.

## 2014-06-11 ENCOUNTER — Ambulatory Visit: Payer: BC Managed Care – PPO | Admitting: Internal Medicine

## 2014-06-16 ENCOUNTER — Encounter: Payer: Self-pay | Admitting: Internal Medicine

## 2014-06-18 ENCOUNTER — Other Ambulatory Visit: Payer: Self-pay | Admitting: Internal Medicine

## 2014-06-18 ENCOUNTER — Ambulatory Visit: Payer: Self-pay | Admitting: Gastroenterology

## 2014-07-02 ENCOUNTER — Ambulatory Visit (INDEPENDENT_AMBULATORY_CARE_PROVIDER_SITE_OTHER): Payer: BC Managed Care – PPO | Admitting: Internal Medicine

## 2014-07-02 ENCOUNTER — Encounter: Payer: Self-pay | Admitting: Internal Medicine

## 2014-07-02 VITALS — BP 118/78 | HR 73 | Temp 97.6°F | Resp 16 | Ht 71.0 in | Wt 225.8 lb

## 2014-07-02 DIAGNOSIS — R338 Other retention of urine: Secondary | ICD-10-CM

## 2014-07-02 DIAGNOSIS — K2100 Gastro-esophageal reflux disease with esophagitis, without bleeding: Secondary | ICD-10-CM

## 2014-07-02 DIAGNOSIS — G4700 Insomnia, unspecified: Secondary | ICD-10-CM

## 2014-07-02 DIAGNOSIS — F5104 Psychophysiologic insomnia: Secondary | ICD-10-CM

## 2014-07-02 DIAGNOSIS — N2889 Other specified disorders of kidney and ureter: Secondary | ICD-10-CM

## 2014-07-02 DIAGNOSIS — F4323 Adjustment disorder with mixed anxiety and depressed mood: Secondary | ICD-10-CM

## 2014-07-02 DIAGNOSIS — N401 Enlarged prostate with lower urinary tract symptoms: Secondary | ICD-10-CM

## 2014-07-02 DIAGNOSIS — I10 Essential (primary) hypertension: Secondary | ICD-10-CM

## 2014-07-02 NOTE — Progress Notes (Signed)
Patient ID: Seth Doyle, male   DOB: 1956/04/25, 59 y.o.   MRN: [id number]   Patient Active Problem List   Diagnosis Date Noted  . Vitamin D deficiency 05/02/2014  . Adjustment disorder with mixed anxiety and depressed mood 04/27/2014  . Urinary retention due to benign prostatic hyperplasia 04/27/2014  . Essential hypertension 04/27/2014  . Encounter for preventive health examination 04/27/2014  . Chronic insomnia 03/26/2014  . Left rotator cuff tear 10/30/2013  . Subacromial bursitis 09/18/2013  . Reflux esophagitis 09/10/2012  . Diverticulitis of sigmoid colon 02/04/2011    Subjective:  CC:   Chief Complaint  Patient presents with  . Follow-up    HPI:   Seth Doyle is a 59 y.o. male who presents for two month follow up on multiple issues, including gastritis,  Adjustment disorder with depression and anxiety , chronic  insomnia, BPH symptoms and  new onset hypertension.  1) Gastritis: patient underwent diagnostic EGD by Lucilla Lame on Jan 29 for persistent post prandial abdominal pain despite use of daily PPI and carafate. Small hiatal hernia,  Gastritis on exam.  Biopsy was negative for malignancy and H Pylori.  Symptoms have resolved with daily use of Dexilant in the AM and OTC Nexium in the evening.  However his insurance, [employer] will not pay for Dexilant and Dr Allen Norris is providing samples unitl the PA can be done.  2) Depression: he stopped taking the wellbutrin after several weeks.  Does not feel that he needs it.  Mood has improved.  Still having insomnia which is managed with medicaitons  3) Hypertension:  BPs are now < 140/80  On Cardura, and his urinary hesitancy has resolved unless he takes Benadryl.    Past Medical History  Diagnosis Date  . Diverticulitis     Past Surgical History  Procedure Laterality Date  . Rhinoplasty  1992    deviated septum due to volleyball  . Rotator cuff repair  1991    Dr. Marry Guan  . Shoulder arthroscopy w/ rotator cuff  repair  1991    [facility]  . Rhinoplasty  1992    foe deviated septm, traumatic  [facility]   . Colon surgery  August 2012    sigmoid colectomy, diverticulitis  . Eye surgery      tear duct repair        The following portions of the patient's history were reviewed and updated as appropriate: Allergies, current medications, and problem list.    Review of Systems:   Patient denies headache, fevers, malaise, unintentional weight loss, skin rash, eye pain, sinus congestion and sinus pain, sore throat, dysphagia,  hemoptysis , cough, dyspnea, wheezing, chest pain, palpitations, orthopnea, edema, abdominal pain, nausea, melena, diarrhea, constipation, flank pain, dysuria, hematuria, urinary  Frequency, nocturia, numbness, tingling, seizures,  Focal weakness, Loss of consciousness,  Tremor, insomnia, depression, anxiety, and suicidal ideation.     History   Social History  . Marital Status: Married    Spouse Name: N/A  . Number of Children: N/A  . Years of Education: N/A   Occupational History  . Not on file.   Social History Main Topics  . Smoking status: Never Smoker   . Smokeless tobacco: Never Used  . Alcohol Use: Yes     Comment: rare  . Drug Use: No  . Sexual Activity: Yes   Other Topics Concern  . Not on file   Social History Narrative    Objective:  Filed Vitals:   07/02/14 1630  BP: 118/78  Pulse: 73  Temp: 97.6 F (36.4 C)  Resp: 16     General appearance: alert, cooperative and appears stated age Neck: no adenopathy, no carotid bruit, supple, symmetrical, trachea midline and thyroid not enlarged, symmetric, no tenderness/mass/nodules Back: symmetric, no curvature. ROM normal. No CVA tenderness Lungs: clear to auscultation bilaterally Heart: regular rate and rhythm, S1, S2 normal, no murmur, click, rub or gallop Abdomen: soft, non-tender; bowel sounds normal; no masses,  no organomegaly Pulses: 2+ and symmetric Skin: Skin color, texture, turgor normal.  No rashes or lesions Lymph nodes: Cervical, supraclavicular, and axillary nodes normal.  Assessment and Plan:  Urinary retention due to benign prostatic hyperplasia Improved with Cardura.  Avoid benadryl,  No changes today   Reflux esophagitis EGD done Jan 2016.  No barretts.  Small HH.  symptoms are controlled only with Dexilant,  Gi to do PA    Adjustment disorder with mixed anxiety and depressed mood Now resolved,  Has stopped wellbutrin   Long discussion today about mood and triggers.   Essential hypertension Well controlled on current regimen. Renal function stable, no changes today.  Lab Results  Component Value Date   CREATININE 1.2 04/30/2014   Lab Results  Component Value Date   NA 137 04/30/2014   K 4.1 04/30/2014   CL 107 04/30/2014   CO2 25 04/30/2014      Chronic insomnia Advised to continue Ambien since benadryl aggravates urinary retention,    A total of 25 minutes of face to face time was spent with patient more than half of which was spent in counselling on the above mentioned issues.  Updated Medication List Outpatient Encounter Prescriptions as of 07/02/2014  Medication Sig  . clindamycin (CLINDAGEL) 1 % gel Apply topically 2 (two) times daily.  Marland Kitchen doxazosin (CARDURA) 1 MG tablet take 1 tablet by mouth once daily  . ergocalciferol (DRISDOL) 50000 UNITS capsule Take 1 capsule (50,000 Units total) by mouth once a week.  . esomeprazole (NEXIUM) 40 MG capsule Take 1 capsule (40 mg total) by mouth 2 (two) times daily before a meal.  . famotidine (PEPCID) 40 MG tablet Take 1 tablet (40 mg total) by mouth at bedtime.  . meloxicam (MOBIC) 15 MG tablet Take 1 tablet (15 mg total) by mouth daily.  . nitroGLYCERIN (NITRODUR - DOSED IN MG/24 HR) 0.2 mg/hr patch 1/4 patch daily  . sucralfate (CARAFATE) 1 GM/10ML suspension take 10 milliliters by mouth four times a day  . traMADol (ULTRAM) 50 MG tablet Take 1 tablet (50 mg total) by mouth every 8 (eight) hours as  needed.  . zolpidem (AMBIEN) 10 MG tablet take 1 tablet by mouth at bedtime if needed  . [DISCONTINUED] buPROPion (WELLBUTRIN SR) 100 MG 12 hr tablet Take 1 tablet (100 mg total) by mouth 2 (two) times daily.     No orders of the defined types were placed in this encounter.    No Follow-up on file.

## 2014-07-04 ENCOUNTER — Encounter: Payer: Self-pay | Admitting: Internal Medicine

## 2014-07-04 NOTE — Assessment & Plan Note (Addendum)
EGD done Jan 2016.  No barretts.  Small HH.  symptoms are controlled only with Dexilant,  Gi to do PA

## 2014-07-04 NOTE — Assessment & Plan Note (Signed)
Well controlled on current regimen. Renal function stable, no changes today.  Lab Results  Component Value Date   CREATININE 1.2 04/30/2014   Lab Results  Component Value Date   NA 137 04/30/2014   K 4.1 04/30/2014   CL 107 04/30/2014   CO2 25 04/30/2014

## 2014-07-04 NOTE — Assessment & Plan Note (Signed)
Now resolved,  Has stopped wellbutrin   Long discussion today about mood and triggers.

## 2014-07-04 NOTE — Assessment & Plan Note (Addendum)
Improved with Cardura.  Avoid benadryl,  No changes today

## 2014-07-04 NOTE — Assessment & Plan Note (Signed)
Advised to continue Ambien since benadryl aggravates urinary retention,

## 2014-07-07 DIAGNOSIS — E278 Other specified disorders of adrenal gland: Secondary | ICD-10-CM | POA: Insufficient documentation

## 2014-07-07 NOTE — Telephone Encounter (Signed)
Mychart message sent.

## 2014-07-27 ENCOUNTER — Encounter: Payer: Self-pay | Admitting: Internal Medicine

## 2014-07-28 ENCOUNTER — Other Ambulatory Visit: Payer: Self-pay | Admitting: Internal Medicine

## 2014-07-29 NOTE — Telephone Encounter (Signed)
Last visit 07/02/14

## 2014-07-29 NOTE — Telephone Encounter (Signed)
Ok to refill,  Authorized in epic please phone in

## 2014-07-29 NOTE — Telephone Encounter (Signed)
Called to pharmacy 

## 2014-07-30 ENCOUNTER — Telehealth: Payer: Self-pay

## 2014-07-30 NOTE — Telephone Encounter (Signed)
Received PA request from [facility]. Form completed on cover my meds. Awaiting a response at this time.

## 2014-08-02 NOTE — Telephone Encounter (Signed)
Fax from [facility], Zolpidem approved through 07/30/15

## 2014-09-21 ENCOUNTER — Telehealth: Payer: Self-pay

## 2014-09-21 NOTE — Telephone Encounter (Signed)
Please advise 

## 2014-09-21 NOTE — Telephone Encounter (Signed)
mychart message sent for more detail

## 2014-09-21 NOTE — Telephone Encounter (Signed)
The pt is hoping to get a referral to a doctor for ongoing back pain.

## 2014-09-30 ENCOUNTER — Encounter: Payer: Self-pay | Admitting: Internal Medicine

## 2014-10-01 ENCOUNTER — Ambulatory Visit: Payer: Self-pay | Admitting: Internal Medicine

## 2014-12-01 ENCOUNTER — Other Ambulatory Visit: Payer: Self-pay | Admitting: Internal Medicine

## 2014-12-02 NOTE — Telephone Encounter (Signed)
Ok to refill,  printed rx  

## 2014-12-02 NOTE — Telephone Encounter (Signed)
Rx faxed

## 2014-12-02 NOTE — Telephone Encounter (Signed)
Last OV 2.12.16.  Please advise refill

## 2015-01-02 ENCOUNTER — Encounter: Payer: Self-pay | Admitting: Internal Medicine

## 2015-01-10 ENCOUNTER — Encounter: Payer: Self-pay | Admitting: Internal Medicine

## 2015-01-15 ENCOUNTER — Other Ambulatory Visit: Payer: Self-pay | Admitting: Internal Medicine

## 2015-01-15 MED ORDER — TAMSULOSIN HCL 0.4 MG PO CAPS: 0.4000 mg | ORAL_CAPSULE | Freq: Every day | ORAL | Status: DC

## 2015-01-27 ENCOUNTER — Emergency Department
Admission: EM | Admit: 2015-01-27 | Discharge: 2015-01-27 | Disposition: A | Discharge status: Home / Self Care | Payer: BC Managed Care – PPO | Attending: Emergency Medicine | Admitting: Emergency Medicine

## 2015-01-27 ENCOUNTER — Other Ambulatory Visit: Payer: Self-pay

## 2015-01-27 ENCOUNTER — Emergency Department: Payer: BC Managed Care – PPO

## 2015-01-27 DIAGNOSIS — I10 Essential (primary) hypertension: Secondary | ICD-10-CM | POA: Diagnosis not present

## 2015-01-27 DIAGNOSIS — K219 Gastro-esophageal reflux disease without esophagitis: Secondary | ICD-10-CM | POA: Diagnosis not present

## 2015-01-27 DIAGNOSIS — Z791 Long term (current) use of non-steroidal anti-inflammatories (NSAID): Secondary | ICD-10-CM | POA: Diagnosis not present

## 2015-01-27 DIAGNOSIS — Z792 Long term (current) use of antibiotics: Secondary | ICD-10-CM | POA: Insufficient documentation

## 2015-01-27 DIAGNOSIS — R079 Chest pain, unspecified: Secondary | ICD-10-CM | POA: Insufficient documentation

## 2015-01-27 DIAGNOSIS — R1084 Generalized abdominal pain: Secondary | ICD-10-CM

## 2015-01-27 DIAGNOSIS — IMO0001 Reserved for inherently not codable concepts without codable children: Secondary | ICD-10-CM

## 2015-01-27 DIAGNOSIS — R634 Abnormal weight loss: Secondary | ICD-10-CM | POA: Insufficient documentation

## 2015-01-27 DIAGNOSIS — Z79899 Other long term (current) drug therapy: Secondary | ICD-10-CM | POA: Diagnosis not present

## 2015-01-27 LAB — CBC
HEMATOCRIT: 43.1 % (ref 40.0–52.0)
Hemoglobin: 14.6 g/dL (ref 13.0–18.0)
MCH: 30.1 pg (ref 26.0–34.0)
MCHC: 33.8 g/dL (ref 32.0–36.0)
MCV: 88.9 fL (ref 80.0–100.0)
PLATELETS: 158 10*3/uL (ref 150–440)
RBC: 4.85 MIL/uL (ref 4.40–5.90)
RDW: 12.6 % (ref 11.5–14.5)
WBC: 7.1 10*3/uL (ref 3.8–10.6)

## 2015-01-27 LAB — URINALYSIS COMPLETE WITH MICROSCOPIC (ARMC ONLY)
BACTERIA UA: NONE SEEN
Bilirubin Urine: NEGATIVE
GLUCOSE, UA: NEGATIVE mg/dL
HGB URINE DIPSTICK: NEGATIVE
Ketones, ur: NEGATIVE mg/dL
LEUKOCYTES UA: NEGATIVE
NITRITE: NEGATIVE
PH: 7 (ref 5.0–8.0)
Protein, ur: NEGATIVE mg/dL
SPECIFIC GRAVITY, URINE: 1.014 (ref 1.005–1.030)
SQUAMOUS EPITHELIAL / LPF: NONE SEEN

## 2015-01-27 LAB — COMPREHENSIVE METABOLIC PANEL
ALBUMIN: 4.3 g/dL (ref 3.5–5.0)
ALT: 26 U/L (ref 17–63)
ANION GAP: 6 (ref 5–15)
AST: 29 U/L (ref 15–41)
Alkaline Phosphatase: 60 U/L (ref 38–126)
BILIRUBIN TOTAL: 1 mg/dL (ref 0.3–1.2)
BUN: 19 mg/dL (ref 6–20)
CHLORIDE: 104 mmol/L (ref 101–111)
CO2: 30 mmol/L (ref 22–32)
Calcium: 9.9 mg/dL (ref 8.9–10.3)
Creatinine, Ser: 1.2 mg/dL (ref 0.61–1.24)
GFR calc Af Amer: >60 mL/min (ref >60)
GFR calc non Af Amer: >60 mL/min (ref >60)
GLUCOSE: 114 mg/dL — AB (ref 65–99)
POTASSIUM: 4 mmol/L (ref 3.5–5.1)
SODIUM: 140 mmol/L (ref 135–145)
Total Protein: 7.3 g/dL (ref 6.5–8.1)

## 2015-01-27 LAB — TROPONIN I: Troponin I: <0.03 ng/mL (ref <0.031)

## 2015-01-27 MED ORDER — PANTOPRAZOLE SODIUM 40 MG PO TBEC
DELAYED_RELEASE_TABLET | ORAL | Status: AC
Start: 2015-01-27 — End: 2015-01-27
  Administered 2015-01-27: 40 mg via ORAL
  Filled 2015-01-27: qty 1

## 2015-01-27 MED ORDER — METOCLOPRAMIDE HCL 10 MG PO TABS
10.0000 mg | ORAL_TABLET | Freq: Once | ORAL | Status: AC
  Administered 2015-01-27: 10 mg via ORAL
  Filled 2015-01-27: qty 1

## 2015-01-27 MED ORDER — PANTOPRAZOLE SODIUM 40 MG PO TBEC
40.0000 mg | DELAYED_RELEASE_TABLET | Freq: Every day | ORAL | Status: DC
  Administered 2015-01-27: 40 mg via ORAL

## 2015-01-27 NOTE — ED Notes (Signed)

## 2015-01-27 NOTE — ED Provider Notes (Signed)
[facility] Emergency Department Provider Note  ____________________________________________  Time seen: 2035  I have reviewed the triage vital signs and the nursing notes.   HISTORY  Chief Complaint Chest Pain  abdominal discomfort    HPI Seth Doyle is a 59 y.o. male who reports he has had lower abdominal discomfort since Monday. This has waxed and waned and has mostly gone away. Today, around lunch time, he began to develop discomfort and pressure in his chest. He reports his left arm felt "cold".  In addition to the abdominal discomfort for 3-4 days and the chest discomfort today, the patient reports that he has been having episodes where he feels a little weak, with some tunnel vision, similar to some migraine pattern that he has had before.  Seth Doyle reports that he has lost approximately 22 pounds in one month, intentionally. He has changed his diet. With the weight loss, he has stopped taking his antihypertensive and has reduced the amount of reflux medicine he takes.  Of note, the patient does have a history of diverticulitis and had a bowel resection approximately 3 or 4 years ago.  Past Medical History  Diagnosis Date  . Diverticulitis     Patient Active Problem List   Diagnosis Date Noted  . 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 Vitamin D deficiency 05/02/2014  . Adjustment disorder with mixed anxiety and depressed mood 04/27/2014  . Urinary retention due to benign prostatic hyperplasia 04/27/2014  . Essential hypertension 04/27/2014  . Encounter for preventive health examination 04/27/2014  . Chronic insomnia 03/26/2014  . Left rotator cuff tear 10/30/2013  . Subacromial bursitis 09/18/2013  . Reflux esophagitis 09/10/2012  . Diverticulitis of sigmoid colon 02/04/2011    Past Surgical History  Procedure Laterality Date  . Rhinoplasty  1992    deviated septum due to volleyball  . Rotator cuff repair  1991    Dr. Marry Guan  .  Shoulder arthroscopy w/ rotator cuff repair  1991    [facility]  . Rhinoplasty  1992    foe deviated septm, traumatic  [facility]   . Colon surgery  August 2012    sigmoid colectomy, diverticulitis  . Eye surgery      tear duct repair     Current Outpatient Rx  Name  Route  Sig  Dispense  Refill  . doxazosin (CARDURA) 1 MG tablet   Oral   Take 1 mg by mouth daily.         . ranitidine (ZANTAC) 75 MG tablet   Oral   Take 75 mg by mouth daily.         . tamsulosin (FLOMAX) 0.4 MG CAPS capsule   Oral   Take 1 capsule (0.4 mg total) by mouth daily.   30 capsule   3   . zolpidem (AMBIEN) 10 MG tablet      take 1 tablet by mouth at bedtime if needed   30 tablet   3   . clindamycin (CLINDAGEL) 1 % gel   Topical   Apply topically 2 (two) times daily.   30 g   0   . ergocalciferol (DRISDOL) 50000 UNITS capsule   Oral   Take 1 capsule (50,000 Units total) by mouth once a week.   12 capsule   0   . esomeprazole (NEXIUM) 40 MG capsule   Oral   Take 1 capsule (40 mg total) by mouth 2 (two) times daily before a meal.   60 capsule   5   .  famotidine (PEPCID) 40 MG tablet   Oral   Take 1 tablet (40 mg total) by mouth at bedtime.   90 tablet   0   . meloxicam (MOBIC) 15 MG tablet   Oral   Take 1 tablet (15 mg total) by mouth daily.   30 tablet   0   . nitroGLYCERIN (NITRODUR - DOSED IN MG/24 HR) 0.2 mg/hr patch      1/4 patch daily   30 patch   1   . sucralfate (CARAFATE) 1 GM/10ML suspension      take 10 milliliters by mouth four times a day   420 mL   0   . traMADol (ULTRAM) 50 MG tablet   Oral   Take 1 tablet (50 mg total) by mouth every 8 (eight) hours as needed.   50 tablet   0     Allergies Wellbutrin  Family History  Problem Relation Age of Onset  . Diabetes Mother   . Mental illness Mother     Dementia  . COPD Father   . Hyperlipidemia Father   . Mental illness Father     Parkinson's Disease    Social History Social History   Substance Use Topics  . Smoking status: Never Smoker   . Smokeless tobacco: Never Used  . Alcohol Use: Yes     Comment: rare    Review of Systems  Constitutional: Negative for fever positive for significant intentional weight loss of 20 pounds in one month.. ENT: Negative for sore throat. Cardiovascular: Positive for chest pain with a cold feeling in his left arm. See history of present illness Respiratory: Negative for shortness of breath. Gastrointestinal: Lower abdominal discomfort. See history of present illness. Genitourinary: Negative for dysuria. Musculoskeletal: No myalgias or injuries. Skin: Negative for rash. Neurological: Negative for headaches   10-point ROS otherwise negative.  ____________________________________________   PHYSICAL EXAM:  VITAL SIGNS: ED Triage Vitals  Enc Vitals Group     BP 01/27/15 1910 141/82 mmHg     Pulse Rate 01/27/15 1910 68     Resp 01/27/15 1910 18     Temp 01/27/15 1910 98.3 F (36.8 C)     Temp Source 01/27/15 1910 Oral     SpO2 01/27/15 1910 96 %     Weight 01/27/15 1910 198 lb (89.812 kg)     Height 01/27/15 1910 5\' 11"  (1.803 m)     Head Cir --      Peak Flow --      Pain Score 01/27/15 1910 2     Pain Loc --      Pain Edu? --      Excl. in [place]? --     Constitutional: Well appearing, calm, no acute distress. ENT   Head: Normocephalic and atraumatic.   Nose: No congestion/rhinnorhea. Cardiovascular: Normal rate, regular rhythm, no murmur noted Respiratory:  Normal respiratory effort, no tachypnea.    Breath sounds are clear and equal bilaterally.  Gastrointestinal: Soft and nontender, including in the lower abdomen. No distention.  Back: No muscle spasm, no tenderness, no CVA tenderness. Musculoskeletal: No deformity noted. Nontender with normal range of motion in all extremities.  No noted edema. Neurologic:  Normal speech and language. No gross focal neurologic deficits are appreciated.  Skin:  Skin is  warm, dry. No rash noted. Psychiatric: Mood and affect are normal. Speech and behavior are normal.  ____________________________________________    LABS (pertinent positives/negatives)  Labs Reviewed  COMPREHENSIVE METABOLIC PANEL - Abnormal; Notable  for the following:    Glucose, Bld 114 (*)    All other components within normal limits  URINALYSIS COMPLETEWITH MICROSCOPIC ([facility] ONLY) - Abnormal; Notable for the following:    Color, Urine YELLOW (*)    APPearance CLEAR (*)    All other components within normal limits  CBC  TROPONIN I     ____________________________________________   EKG  ED ECG REPORT I, Dyshon Philbin W, the attending physician, personally viewed and interpreted this ECG.   Date: 01/27/2015  EKG Time: 1908  Rate: 66  Rhythm: Normal sinus rhythm  Axis: Normal  Intervals: Normal  ST&T Change: None   ____________________________________________    RADIOLOGY  Chest x-ray: IMPRESSION: Slight scarring left base. No edema or consolidation.  ____________________________________________   INITIAL IMPRESSION / ASSESSMENT AND PLAN / ED COURSE  Pertinent labs & imaging results that were available during my care of the patient were reviewed by me and considered in my medical decision making (see chart for details).   Well-appearing 59 year old male in no acute distress. His abdomen appears benign. His chest discomfort is nonspecific and has improved. I have seen his CBC which is normal. Patient with a normal-appearing EKG . Troponin and metabolic panel are pending. I have added on a chest x-ray.  ----------------------------------------- 11:07 PM on 01/27/2015 -----------------------------------------  Chest x-ray was unremarkable. Patient feels well. Troponin is negative.  The patient is having GI symptoms and has a history of reflux. I suspect that the symptoms are not cardiac in nature. The patient agrees and is ready for discharge. I have  asked him to call his primary physician, Dr.Tullo, in the morning to arrange for follow-up.  We will give the patient protonic's now. He has a prescription for ongoing PPI at home.   ____________________________________________   FINAL CLINICAL IMPRESSION(S) / ED DIAGNOSES  Final diagnoses:  Chest pain, unspecified chest pain type  Generalized abdominal pain  Reflux      Ahmed Prima, MD 01/27/15 2318

## 2015-01-27 NOTE — ED Notes (Signed)
Pt in with co lower abd pain for few days, no dysuria, no n.v.d.  States today had chest tightness and dizziness, no diaphoresis, or shob.

## 2015-01-27 NOTE — Discharge Instructions (Signed)
Your cardiac testing and EKG were normal. This does not completely rule out a cardiac condition, but given your GI symptoms, this seems more likely. Follow-up with your regular doctor, Dr. Derrel Nip. Restart your proton pump inhibitor (Nexium or other). Take an 81 mg aspirin once a day. Return to the emergency department if you have worsening symptoms or other urgent concerns.  Chest Pain (Nonspecific) It is often hard to give a diagnosis for the cause of chest pain. There is always a chance that your pain could be related to something serious, such as a heart attack or a blood clot in the lungs. You need to follow up with your doctor. HOME CARE  If antibiotic medicine was given, take it as directed by your doctor. Finish the medicine even if you start to feel better.  For the next few days, avoid activities that bring on chest pain. Continue physical activities as told by your doctor.  Do not use any tobacco products. This includes cigarettes, chewing tobacco, and e-cigarettes.  Avoid drinking alcohol.  Only take medicine as told by your doctor.  Follow your doctor's suggestions for more testing if your chest pain does not go away.  Keep all doctor visits you made. GET HELP IF:  Your chest pain does not go away, even after treatment.  You have a rash with blisters on your chest.  You have a fever. GET HELP RIGHT AWAY IF:   You have more pain or pain that spreads to your arm, neck, jaw, back, or belly (abdomen).  You have shortness of breath.  You cough more than usual or cough up blood.  You have very bad back or belly pain.  You feel sick to your stomach (nauseous) or throw up (vomit).  You have very bad weakness.  You pass out (faint).  You have chills. This is an emergency. Do not wait to see if the problems will go away. Call your local emergency services (911 in [place] Do not drive yourself to the hospital. MAKE SURE YOU:   Understand these instructions.  Will watch  your condition.  Will get help right away if you are not doing well or get worse. Document Released: 10/24/2007 Document Revised: 05/12/2013 Document Reviewed: 10/24/2007 [facility] Patient Information 2015 [place], [place]. This information is not intended to replace advice given to you by your health care provider. Make sure you discuss any questions you have with your health care provider.

## 2015-01-28 ENCOUNTER — Other Ambulatory Visit: Payer: Self-pay | Admitting: Internal Medicine

## 2015-01-28 ENCOUNTER — Telehealth: Payer: Self-pay | Admitting: Internal Medicine

## 2015-01-28 MED ORDER — CIPROFLOXACIN HCL 500 MG PO TABS: 500.0000 mg | ORAL_TABLET | Freq: Two times a day (BID) | ORAL | Status: DC

## 2015-01-28 MED ORDER — METRONIDAZOLE 500 MG PO TABS: 500.0000 mg | ORAL_TABLET | Freq: Three times a day (TID) | ORAL | Status: DC

## 2015-01-28 NOTE — Telephone Encounter (Signed)
FYI - Pt called with persistent lower/periumbilical abdominal pain. Seen in ED last night. CBC and CMP normal. No abdominal imaging performed.  Reports that symptoms are similar to previous episode of diverticulitis. No NV. No diarrhea at present. Discussed potential causes of his pain including diverpendicitis and cholecystitis. Will start Cipro and Flagyl. Clear liquid diet as tolerated.  If worsening or persistent pain, then he will go back to the ED this weekend.   Otherwise follow up as scheduled next week.

## 2015-02-04 ENCOUNTER — Encounter: Payer: Self-pay | Admitting: Internal Medicine

## 2015-02-04 ENCOUNTER — Ambulatory Visit (INDEPENDENT_AMBULATORY_CARE_PROVIDER_SITE_OTHER): Payer: BC Managed Care – PPO | Admitting: Internal Medicine

## 2015-02-04 VITALS — BP 120/80 | HR 72 | Temp 97.8°F | Resp 14 | Ht 71.0 in | Wt 195.8 lb

## 2015-02-04 DIAGNOSIS — R110 Nausea: Secondary | ICD-10-CM | POA: Diagnosis not present

## 2015-02-04 DIAGNOSIS — Z23 Encounter for immunization: Secondary | ICD-10-CM | POA: Diagnosis not present

## 2015-02-04 DIAGNOSIS — E785 Hyperlipidemia, unspecified: Secondary | ICD-10-CM

## 2015-02-04 DIAGNOSIS — R1013 Epigastric pain: Secondary | ICD-10-CM | POA: Diagnosis not present

## 2015-02-04 DIAGNOSIS — K5909 Other constipation: Secondary | ICD-10-CM | POA: Diagnosis not present

## 2015-02-04 DIAGNOSIS — K5732 Diverticulitis of large intestine without perforation or abscess without bleeding: Secondary | ICD-10-CM

## 2015-02-04 DIAGNOSIS — R5383 Other fatigue: Secondary | ICD-10-CM | POA: Diagnosis not present

## 2015-02-04 DIAGNOSIS — R1030 Lower abdominal pain, unspecified: Secondary | ICD-10-CM

## 2015-02-04 LAB — CBC WITH DIFFERENTIAL/PLATELET
Basophils Absolute: 0 10*3/uL (ref 0.0–0.1)
Basophils Relative: 0.4 % (ref 0.0–3.0)
EOS ABS: 0.1 10*3/uL (ref 0.0–0.7)
EOS PCT: 0.9 % (ref 0.0–5.0)
HCT: 43.8 % (ref 39.0–52.0)
HEMOGLOBIN: 14.8 g/dL (ref 13.0–17.0)
LYMPHS PCT: 22.4 % (ref 12.0–46.0)
Lymphs Abs: 1.2 10*3/uL (ref 0.7–4.0)
MCHC: 33.8 g/dL (ref 30.0–36.0)
MCV: 88.9 fl (ref 78.0–100.0)
MONO ABS: 0.6 10*3/uL (ref 0.1–1.0)
Monocytes Relative: 11.6 % (ref 3.0–12.0)
Neutro Abs: 3.6 10*3/uL (ref 1.4–7.7)
Neutrophils Relative %: 64.7 % (ref 43.0–77.0)
Platelets: 191 10*3/uL (ref 150.0–400.0)
RBC: 4.92 Mil/uL (ref 4.22–5.81)
RDW: 13 % (ref 11.5–15.5)
WBC: 5.6 10*3/uL (ref 4.0–10.5)

## 2015-02-04 LAB — LIPID PANEL
CHOLESTEROL: 100 mg/dL (ref 0–200)
HDL: 37.9 mg/dL — AB (ref >39.00)
LDL Cholesterol: 52 mg/dL (ref 0–99)
NonHDL: 61.77
Total CHOL/HDL Ratio: 3
Triglycerides: 47 mg/dL (ref 0.0–149.0)
VLDL: 9.4 mg/dL (ref 0.0–40.0)

## 2015-02-04 LAB — LIPASE: LIPASE: 11 U/L (ref 11.0–59.0)

## 2015-02-04 MED ORDER — CIPROFLOXACIN HCL 500 MG PO TABS: 500.0000 mg | ORAL_TABLET | Freq: Two times a day (BID) | ORAL | Status: DC

## 2015-02-04 MED ORDER — LACTULOSE 10 GM/15ML PO SOLN: 10.0000 g | Freq: Two times a day (BID) | ORAL | Status: DC | PRN

## 2015-02-04 MED ORDER — METRONIDAZOLE 500 MG PO TABS: 500.0000 mg | ORAL_TABLET | Freq: Three times a day (TID) | ORAL | Status: DC

## 2015-02-04 NOTE — Patient Instructions (Addendum)
Take a total  of 10 days of abx and add a probiotic !!!  Stop cardura.  Call if BP > 130/80   Lactulose,  Not Linzess for constipation related  to diverticulitis   Always strain your smoothies

## 2015-02-04 NOTE — Progress Notes (Signed)
Pre-visit discussion using our clinic review tool. No additional management support is needed unless otherwise documented below in the visit note.  

## 2015-02-06 NOTE — Progress Notes (Signed)
Subjective:  Patient ID: Seth Doyle, male    DOB: June 26, 1955  Age: 59 y.o. MRN: [id number]  CC: The primary encounter diagnosis was Lower abdominal pain. Diagnoses of Dyslipidemia and Diverticulitis of sigmoid colon were also pertinent to this visit.  HPI Seth Doyle presents for follow up on  ER visit on Sept 8 for lower abdominal pain and progressed to SS chest pain.  He was observed for several hours,  serial troponins negative .  No CT done.  Sent home.  Called the office that afternoon and Dr Gilford Rile prescribed empiric treatment for diverticulitis with cipro/flagy.  Patient recalls that he had inadvertently been eating strawberries daily in his smoothies and thinks that the seeds may have triggered the attack of diverticulitis. He feellns generally better,  Eating a bland diet,  No diarrhea, blood in stools,  Fevers or abd pain.   Outpatient Prescriptions Prior to Visit  Medication Sig Dispense Refill  . doxazosin (CARDURA) 1 MG tablet Take 1 mg by mouth daily.    . ranitidine (ZANTAC) 75 MG tablet Take 75 mg by mouth daily.    . sucralfate (CARAFATE) 1 GM/10ML suspension take 10 milliliters by mouth four times a day 420 mL 0  . tamsulosin (FLOMAX) 0.4 MG CAPS capsule Take 1 capsule (0.4 mg total) by mouth daily. 30 capsule 3  . zolpidem (AMBIEN) 10 MG tablet take 1 tablet by mouth at bedtime if needed 30 tablet 3  . ciprofloxacin (CIPRO) 500 MG tablet Take 1 tablet (500 mg total) by mouth 2 (two) times daily. 28 tablet 0  . metroNIDAZOLE (FLAGYL) 500 MG tablet Take 1 tablet (500 mg total) by mouth 3 (three) times daily. 42 tablet 0  . clindamycin (CLINDAGEL) 1 % gel Apply topically 2 (two) times daily. (Patient not taking: Reported on 02/04/2015) 30 g 0  . ergocalciferol (DRISDOL) 50000 UNITS capsule Take 1 capsule (50,000 Units total) by mouth once a week. (Patient not taking: Reported on 02/04/2015) 12 capsule 0  . esomeprazole (NEXIUM) 40 MG capsule Take 1 capsule (40 mg total)  by mouth 2 (two) times daily before a meal. (Patient not taking: Reported on 02/04/2015) 60 capsule 5  . famotidine (PEPCID) 40 MG tablet Take 1 tablet (40 mg total) by mouth at bedtime. (Patient not taking: Reported on 02/04/2015) 90 tablet 0  . meloxicam (MOBIC) 15 MG tablet Take 1 tablet (15 mg total) by mouth daily. (Patient not taking: Reported on 02/04/2015) 30 tablet 0  . nitroGLYCERIN (NITRODUR - DOSED IN MG/24 HR) 0.2 mg/hr patch 1/4 patch daily (Patient not taking: Reported on 02/04/2015) 30 patch 1  . traMADol (ULTRAM) 50 MG tablet Take 1 tablet (50 mg total) by mouth every 8 (eight) hours as needed. (Patient not taking: Reported on 02/04/2015) 50 tablet 0  . doxazosin (CARDURA) 1 MG tablet take 1 tablet by mouth once daily 30 tablet 5   No facility-administered medications prior to visit.    Review of Systems;  Patient denies headache, fevers, malaise, unintentional weight loss, skin rash, eye pain, sinus congestion and sinus pain, sore throat, dysphagia,  hemoptysis , cough, dyspnea, wheezing, chest pain, palpitations, orthopnea, edema, abdominal pain, nausea, melena, diarrhea, constipation, flank pain, dysuria, hematuria, urinary  Frequency, nocturia, numbness, tingling, seizures,  Focal weakness, Loss of consciousness,  Tremor, insomnia, depression, anxiety, and suicidal ideation.      Objective:  BP 120/80 mmHg  Pulse 72  Temp(Src) 97.8 F (36.6 C) (Oral)  Resp 14  Ht  5\' 11"  (1.803 m)  Wt 195 lb 12 oz (88.792 kg)  BMI 27.31 kg/m2  SpO2 94%  BP Readings from Last 3 Encounters:  02/04/15 120/80  01/27/15 135/85  07/02/14 118/78    Wt Readings from Last 3 Encounters:  02/04/15 195 lb 12 oz (88.792 kg)  01/27/15 198 lb (89.812 kg)  07/02/14 225 lb 12 oz (102.4 kg)    General appearance: alert, cooperative and appears stated age Ears: normal TM's and external ear canals both ears Throat: lips, mucosa, and tongue normal; teeth and gums normal Neck: no adenopathy, no  carotid bruit, supple, symmetrical, trachea midline and thyroid not enlarged, symmetric, no tenderness/mass/nodules Back: symmetric, no curvature. ROM normal. No CVA tenderness. Lungs: clear to auscultation bilaterally Heart: regular rate and rhythm, S1, S2 normal, no murmur, click, rub or gallop Abdomen: soft, non-tender; bowel sounds normal; no masses,  no organomegaly Pulses: 2+ and symmetric Skin: Skin color, texture, turgor normal. No rashes or lesions Lymph nodes: Cervical, supraclavicular, and axillary nodes normal.  No results found for: HGBA1C  Lab Results  Component Value Date   CREATININE 1.20 01/27/2015   CREATININE 1.2 04/30/2014   CREATININE 1.2 04/27/2013    Lab Results  Component Value Date   WBC 5.6 02/04/2015   HGB 14.8 02/04/2015   HCT 43.8 02/04/2015   PLT 191.0 02/04/2015   GLUCOSE 114* 01/27/2015   CHOL 100 02/04/2015   TRIG 47.0 02/04/2015   HDL 37.90* 02/04/2015   LDLCALC 52 02/04/2015   ALT 26 01/27/2015   AST 29 01/27/2015   NA 140 01/27/2015   K 4.0 01/27/2015   CL 104 01/27/2015   CREATININE 1.20 01/27/2015   BUN 19 01/27/2015   CO2 30 01/27/2015   TSH 1.45 04/30/2014   PSA 0.74 04/30/2014   MICROALBUR 0.4 04/30/2014    Dg Chest 2 View  01/27/2015   CLINICAL DATA:  Chest tightness and dizziness  EXAM: CHEST  2 VIEW  COMPARISON:  None.  FINDINGS: There is minimal scarring in the left base. There is no edema or consolidation. The heart size and pulmonary vascularity normal. No adenopathy. There is lower thoracic levoscoliosis.  IMPRESSION: Slight scarring left base.  No edema or consolidation.   Electronically Signed   By: Lowella Grip III M.D.   On: 01/27/2015 21:07    Assessment & Plan:   Problem List Items Addressed This Visit      Unprioritized   Diverticulitis of sigmoid colon    Presumed, secondary to ingestion of strawberry seeds,  continue cip.flagyl for minimum of 10 days .  Lab Results  Component Value Date   WBC 5.6  02/04/2015   HGB 14.8 02/04/2015   HCT 43.8 02/04/2015   MCV 88.9 02/04/2015   PLT 191.0 02/04/2015   Lab Results  Component Value Date   LIPASE 11.0 02/04/2015         Relevant Medications   ciprofloxacin (CIPRO) 500 MG tablet   metroNIDAZOLE (FLAGYL) 500 MG tablet    Other Visit Diagnoses    Lower abdominal pain    -  Primary    Relevant Orders    CBC with Differential/Platelet (Completed)    Lipase (Completed)    Dyslipidemia        Relevant Orders    Lipid panel (Completed)       A total of 25 minutes of face to face time was spent with patient more than half of which was spent in counselling about the above mentioned  conditions  and coordination of care  I am having Mr. Viens start on lactulose. I am also having him maintain his famotidine, meloxicam, nitroGLYCERIN, traMADol, esomeprazole, ergocalciferol, sucralfate, clindamycin, zolpidem, tamsulosin, doxazosin, ranitidine, ciprofloxacin, and metroNIDAZOLE.  Meds ordered this encounter  Medications  . ciprofloxacin (CIPRO) 500 MG tablet    Sig: Take 1 tablet (500 mg total) by mouth 2 (two) times daily.    Dispense:  28 tablet    Refill:  0  . metroNIDAZOLE (FLAGYL) 500 MG tablet    Sig: Take 1 tablet (500 mg total) by mouth 3 (three) times daily.    Dispense:  42 tablet    Refill:  0  . lactulose (CHRONULAC) 10 GM/15ML solution    Sig: Take 15 mLs (10 g total) by mouth 2 (two) times daily as needed for mild constipation.    Dispense:  240 mL    Refill:  0    Medications Discontinued During This Encounter  Medication Reason  . doxazosin (CARDURA) 1 MG tablet Duplicate  . ciprofloxacin (CIPRO) 500 MG tablet Reorder  . metroNIDAZOLE (FLAGYL) 500 MG tablet Reorder    Follow-up: No Follow-up on file.   Crecencio Mc, MD

## 2015-02-06 NOTE — Assessment & Plan Note (Addendum)
Presumed, secondary to ingestion of strawberry seeds,  continue cip.flagyl for minimum of 10 days .  Lab Results  Component Value Date   WBC 5.6 02/04/2015   HGB 14.8 02/04/2015   HCT 43.8 02/04/2015   MCV 88.9 02/04/2015   PLT 191.0 02/04/2015   Lab Results  Component Value Date   LIPASE 11.0 02/04/2015

## 2015-02-13 ENCOUNTER — Encounter: Payer: Self-pay | Admitting: Internal Medicine

## 2015-02-14 ENCOUNTER — Other Ambulatory Visit: Payer: Self-pay | Admitting: Internal Medicine

## 2015-02-14 DIAGNOSIS — K5732 Diverticulitis of large intestine without perforation or abscess without bleeding: Secondary | ICD-10-CM

## 2015-02-15 ENCOUNTER — Other Ambulatory Visit: Payer: Self-pay | Admitting: Internal Medicine

## 2015-02-15 ENCOUNTER — Other Ambulatory Visit: Payer: Self-pay

## 2015-02-15 NOTE — Telephone Encounter (Signed)
error 

## 2015-02-17 ENCOUNTER — Other Ambulatory Visit: Payer: Self-pay | Admitting: Internal Medicine

## 2015-02-17 ENCOUNTER — Ambulatory Visit
Admission: RE | Admit: 2015-02-17 | Discharge: 2015-02-17 | Disposition: A | Discharge status: Home / Self Care | Payer: BC Managed Care – PPO | Source: Ambulatory Visit | Attending: Internal Medicine | Admitting: Internal Medicine

## 2015-02-17 DIAGNOSIS — K5732 Diverticulitis of large intestine without perforation or abscess without bleeding: Secondary | ICD-10-CM

## 2015-02-17 DIAGNOSIS — K5730 Diverticulosis of large intestine without perforation or abscess without bleeding: Secondary | ICD-10-CM | POA: Insufficient documentation

## 2015-02-17 MED ORDER — IOHEXOL 300 MG/ML  SOLN
100.0000 mL | Freq: Once | INTRAMUSCULAR | Status: AC | PRN
  Administered 2015-02-17: 100 mL via INTRAVENOUS

## 2015-02-17 NOTE — Telephone Encounter (Signed)
Was e-scribed on 9.28.16

## 2015-02-18 ENCOUNTER — Encounter: Payer: Self-pay | Admitting: Internal Medicine

## 2015-02-18 ENCOUNTER — Telehealth: Payer: Self-pay | Admitting: *Deleted

## 2015-02-18 MED ORDER — LACTULOSE ENCEPHALOPATHY 10 GM/15ML PO SOLN: ORAL | Status: DC

## 2015-02-18 NOTE — Telephone Encounter (Signed)
Done, and mychart message sent to give him CT results

## 2015-02-18 NOTE — Telephone Encounter (Signed)
Patient has requested to have a refill on lactulose -thanks

## 2015-02-21 ENCOUNTER — Encounter: Payer: Self-pay | Admitting: Internal Medicine

## 2015-02-21 ENCOUNTER — Ambulatory Visit (INDEPENDENT_AMBULATORY_CARE_PROVIDER_SITE_OTHER): Payer: BC Managed Care – PPO | Admitting: Internal Medicine

## 2015-02-21 VITALS — BP 108/78 | HR 86 | Temp 98.3°F | Resp 12 | Ht 71.0 in | Wt 198.4 lb

## 2015-02-21 DIAGNOSIS — K5909 Other constipation: Secondary | ICD-10-CM

## 2015-02-21 DIAGNOSIS — K760 Fatty (change of) liver, not elsewhere classified: Secondary | ICD-10-CM

## 2015-02-21 DIAGNOSIS — R110 Nausea: Secondary | ICD-10-CM

## 2015-02-21 DIAGNOSIS — F4323 Adjustment disorder with mixed anxiety and depressed mood: Secondary | ICD-10-CM

## 2015-02-21 DIAGNOSIS — M6281 Muscle weakness (generalized): Secondary | ICD-10-CM

## 2015-02-21 DIAGNOSIS — R5383 Other fatigue: Secondary | ICD-10-CM | POA: Diagnosis not present

## 2015-02-21 DIAGNOSIS — R1013 Epigastric pain: Secondary | ICD-10-CM | POA: Diagnosis not present

## 2015-02-21 DIAGNOSIS — Z23 Encounter for immunization: Secondary | ICD-10-CM

## 2015-02-21 NOTE — Patient Instructions (Signed)
Gastric emptying  study and referral to Dr Allen Norris  pending labs from today

## 2015-02-21 NOTE — Progress Notes (Signed)
Subjective:  Patient ID: Seth Doyle, male    DOB: August 07, 1955  Age: 59 y.o. MRN: [id number]  CC: The primary encounter diagnosis was Encounter for immunization. Diagnoses of Other fatigue, Nausea without vomiting, Other constipation, Epigastric pain, Muscle weakness, Adjustment disorder with mixed anxiety and depressed mood, and Hepatic steatosis were also pertinent to this visit.  HPI Seth Doyle presents for follow up on  Recent episode of lower abdominal pain.  He was treated empirically for diverticulitis with cipro/flagyl,  But a CT scan was done which showed no evidence of diverticulitis, but fatty liver and adrenal adenoma noted.  He has not felt well for a month.   He has insomnia despite taking ambien.      He endorses fatigue and weakness. His appetite is poor. His bowels have been moving only with daily use of lactulose (prior to a month ago he was having 2-3 stools daily),  drinking 2-3 bottles daily of water.  Some urinary retention before  the CT scan, now resolved. .  Eating normal diet , but develops abdominal  distension but no nausea,  just feels miserable.   Taking Dexilant in the am and  xantac prn   Fatty liver by CT scan.  He has no history of elevated liver enzymes. He has a remote history of steroid use, an  8 week history of anabolic steroids in August 1996  during a training for bodybuilding   He woke up yesterday morning with a hypopigmented macule on his right wrist. He has no history of topical steroid use, no history of burns.  He denies pain, itching and swelling .     Outpatient Prescriptions Prior to Visit  Medication Sig Dispense Refill  . lactulose, encephalopathy, (CHRONULAC) 10 GM/15ML SOLN take 15 milliliters by mouth twice a day if needed for MILD CONSTIPATION 240 mL 3  . ranitidine (ZANTAC) 75 MG tablet Take 75 mg by mouth daily.    . tamsulosin (FLOMAX) 0.4 MG CAPS capsule Take 1 capsule (0.4 mg total) by mouth daily. 30 capsule 3  .  doxazosin (CARDURA) 1 MG tablet Take 1 mg by mouth daily.    Marland Kitchen zolpidem (AMBIEN) 10 MG tablet take 1 tablet by mouth at bedtime if needed 30 tablet 3  . ciprofloxacin (CIPRO) 500 MG tablet Take 1 tablet (500 mg total) by mouth 2 (two) times daily. 28 tablet 0  . clindamycin (CLINDAGEL) 1 % gel Apply topically 2 (two) times daily. (Patient not taking: Reported on 02/04/2015) 30 g 0  . ergocalciferol (DRISDOL) 50000 UNITS capsule Take 1 capsule (50,000 Units total) by mouth once a week. (Patient not taking: Reported on 02/04/2015) 12 capsule 0  . esomeprazole (NEXIUM) 40 MG capsule Take 1 capsule (40 mg total) by mouth 2 (two) times daily before a meal. (Patient not taking: Reported on 02/04/2015) 60 capsule 5  . famotidine (PEPCID) 40 MG tablet Take 1 tablet (40 mg total) by mouth at bedtime. (Patient not taking: Reported on 02/04/2015) 90 tablet 0  . meloxicam (MOBIC) 15 MG tablet Take 1 tablet (15 mg total) by mouth daily. (Patient not taking: Reported on 02/04/2015) 30 tablet 0  . metroNIDAZOLE (FLAGYL) 500 MG tablet Take 1 tablet (500 mg total) by mouth 3 (three) times daily. 42 tablet 0  . nitroGLYCERIN (NITRODUR - DOSED IN MG/24 HR) 0.2 mg/hr patch 1/4 patch daily (Patient not taking: Reported on 02/04/2015) 30 patch 1  . sucralfate (CARAFATE) 1 GM/10ML suspension take 10 milliliters by  mouth four times a day (Patient not taking: Reported on 02/21/2015) 420 mL 0  . traMADol (ULTRAM) 50 MG tablet Take 1 tablet (50 mg total) by mouth every 8 (eight) hours as needed. (Patient not taking: Reported on 02/04/2015) 50 tablet 0   No facility-administered medications prior to visit.    Review of Systems;  Patient denies headache, fevers, malaise, unintentional weight loss, skin rash, eye pain, sinus congestion and sinus pain, sore throat, dysphagia,  hemoptysis , cough, dyspnea, wheezing, chest pain, palpitations, orthopnea, edema, abdominal pain, nausea, melena, diarrhea, constipation, flank pain, dysuria,  hematuria, urinary  Frequency, nocturia, numbness, tingling, seizures,  Focal weakness, Loss of consciousness,  Tremor, insomnia, depression, anxiety, and suicidal ideation.      Objective:  BP 108/78 mmHg  Pulse 86  Temp(Src) 98.3 F (36.8 C) (Oral)  Resp 12  Ht 5\' 11"  (1.803 m)  Wt 198 lb 6 oz (89.982 kg)  BMI 27.68 kg/m2  SpO2 95%  BP Readings from Last 3 Encounters:  02/21/15 108/78  02/04/15 120/80  01/27/15 135/85    Wt Readings from Last 3 Encounters:  02/21/15 198 lb 6 oz (89.982 kg)  02/04/15 195 lb 12 oz (88.792 kg)  01/27/15 198 lb (89.812 kg)    General appearance: alert, cooperative and appears stated age Ears: normal TM's and external ear canals both ears Throat: lips, mucosa, and tongue normal; teeth and gums normal Neck: no adenopathy, no carotid bruit, supple, symmetrical, trachea midline and thyroid not enlarged, symmetric, no tenderness/mass/nodules Back: symmetric, no curvature. ROM normal. No CVA tenderness. Lungs: clear to auscultation bilaterally Heart: regular rate and rhythm, S1, S2 normal, no murmur, click, rub or gallop Abdomen: soft, non-tender; bowel sounds normal; no masses,  no organomegaly Pulses: 2+ and symmetric Skin: Skin color, texture, turgor normal. No rashes or lesions Lymph nodes: Cervical, supraclavicular, and axillary nodes normal.  No results found for: HGBA1C  Lab Results  Component Value Date   CREATININE 1.12 02/21/2015   CREATININE 1.20 01/27/2015   CREATININE 1.2 04/30/2014    Lab Results  Component Value Date   WBC 5.9 02/21/2015   HGB 14.6 02/21/2015   HCT 42.8 02/21/2015   PLT 170.0 02/21/2015   GLUCOSE 73 02/21/2015   CHOL 100 02/04/2015   TRIG 47.0 02/04/2015   HDL 37.90* 02/04/2015   LDLCALC 52 02/04/2015   ALT 30 02/21/2015   AST 24 02/21/2015   NA 141 02/21/2015   K 4.6 02/21/2015   CL 105 02/21/2015   CREATININE 1.12 02/21/2015   BUN 12 02/21/2015   CO2 28 02/21/2015   TSH 1.88 02/21/2015    PSA 0.74 04/30/2014   MICROALBUR 0.4 04/30/2014    Ct Abdomen Pelvis W Contrast  02/17/2015   CLINICAL DATA:  Abdominal discomfort for a few days with difficulty urinating, history of diverticulitis and prior colectomy  EXAM: CT ABDOMEN AND PELVIS WITH CONTRAST  TECHNIQUE: Multidetector CT imaging of the abdomen and pelvis was performed using the standard protocol following bolus administration of intravenous contrast.  CONTRAST:  168mL OMNIPAQUE IOHEXOL 300 MG/ML  SOLN  COMPARISON:  11/17/2010  FINDINGS: The lung bases are free of acute infiltrate or sizable effusion.  The liver is diffusely decreased in attenuation consistent with fatty infiltration. The spleen, gallbladder, adrenal glands and pancreas are within normal limits with the exception of a right adrenal lesion measuring 19 mm in greatest dimension. This is increased in size from the prior exam but still likely represents an adenoma.  Kidneys  demonstrate a normal enhancement pattern. No renal calculi or obstructive changes are seen. The ureters are well visualized bilaterally and no calculi are noted.  The appendix is within normal limits. Diverticular change of the colon is noted as well as changes of prior partial colectomy in the sigmoid colon. The anastomotic site appears patent. No free pelvic fluid is seen. No findings to suggest diverticulitis are seen. Bony structures are within normal limits for the patient's given age.  The bladder is partially distended. No pelvic mass lesion or sidewall abnormality is noted.  IMPRESSION: Diverticulosis without diverticulitis. Changes of prior sigmoid colectomy are noted.  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  No other focal abnormality is seen.   Electronically Signed   By: Inez Catalina M.D.   On: 02/17/2015 15:04    Assessment & Plan:   Problem List Items Addressed This Visit    Adjustment disorder with mixed anxiety and depressed mood    He appears to have a recurrence of mood  disorder with MDD , mild.  Will recommend resuming wellbutrin after full evaluation and rule out of other causes of malaise and fatigue including sleep study       Constipation    New onset, using lactulose to move bowels.  Etiology unclear. Checking lytes,  Thyroid.  Motility may be an ussie given post prandial bloating.  Gastric emptying study to be ordered if labs are normal .  Lab Results  Component Value Date   TSH 1.88 02/21/2015   Lab Results  Component Value Date   NA 141 02/21/2015   K 4.6 02/21/2015   CL 105 02/21/2015   CO2 28 02/21/2015         Hepatic steatosis    By CT scan .  Liver enzymes are persistently normal, and there is a remote history of anabolic steroid use and a more recent history of overweight. Iron stores are slightly elevated but CBC is normal.  Referral to GI for evaluation given multiple GI symptoms,  following gastric emptying study.   Lab Results  Component Value Date   ALT 30 02/21/2015   AST 24 02/21/2015   ALKPHOS 59 02/21/2015   BILITOT 0.4 02/21/2015          Other Visit Diagnoses    Encounter for immunization    -  Primary    Other fatigue        Relevant Orders    CBC with Differential/Platelet (Completed)    TSH (Completed)    Testosterone, Free, Total, SHBG (Completed)    Iron and TIBC (Completed)    Vitamin B12 (Completed)    Nausea without vomiting        Relevant Orders    Comprehensive metabolic panel (Completed)    POCT urinalysis dipstick    H. pylori antibody, IgG (Completed)    Hepatitis, Acute (Completed)    NM GASTRIC EMPTYING    Epigastric pain        Muscle weakness        Relevant Orders    CK (Completed)      A total of 40 minutes was spent with patient more than half of which was spent in counseling patient on the above mentioned issues , reviewing and explaining recent labs and imaging studies done, and coordination of care.  I have discontinued Mr. Trentman meloxicam, nitroGLYCERIN, traMADol,  esomeprazole, ergocalciferol, sucralfate, clindamycin, ciprofloxacin, and metroNIDAZOLE. I am also having him maintain his zolpidem, tamsulosin, doxazosin, ranitidine, lactulose (encephalopathy), DEXILANT, and  famotidine.  Meds ordered this encounter  Medications  . DEXILANT 60 MG capsule    Sig:     Refill:  1  . famotidine (PEPCID) 40 MG tablet    Sig: Take 1 tablet (40 mg total) by mouth at bedtime.    Dispense:  90 tablet    Refill:  0    Medications Discontinued During This Encounter  Medication Reason  . ciprofloxacin (CIPRO) 500 MG tablet Completed Course  . clindamycin (CLINDAGEL) 1 % gel Error  . ergocalciferol (DRISDOL) 50000 UNITS capsule Completed Course  . esomeprazole (NEXIUM) 40 MG capsule Completed Course  . famotidine (PEPCID) 40 MG tablet Error  . meloxicam (MOBIC) 15 MG tablet Completed Course  . metroNIDAZOLE (FLAGYL) 500 MG tablet Completed Course  . nitroGLYCERIN (NITRODUR - DOSED IN MG/24 HR) 0.2 mg/hr patch Error  . sucralfate (CARAFATE) 1 GM/10ML suspension Completed Course  . traMADol (ULTRAM) 50 MG tablet Error  . meloxicam (MOBIC) 15 MG tablet Completed Course  . clindamycin (CLINDAGEL) 1 % gel Error  . esomeprazole (NEXIUM) 40 MG capsule Completed Course  . traMADol (ULTRAM) 50 MG tablet Error  . ergocalciferol (DRISDOL) 50000 UNITS capsule Completed Course  . sucralfate (CARAFATE) 1 GM/10ML suspension Completed Course  . nitroGLYCERIN (NITRODUR - DOSED IN MG/24 HR) 0.2 mg/hr patch Error    Follow-up: No Follow-up on file.   Crecencio Mc, MD

## 2015-02-21 NOTE — Progress Notes (Signed)
Pre-visit discussion using our clinic review tool. No additional management support is needed unless otherwise documented below in the visit note.  

## 2015-02-22 ENCOUNTER — Ambulatory Visit: Payer: Self-pay | Admitting: Internal Medicine

## 2015-02-22 ENCOUNTER — Encounter: Payer: Self-pay | Admitting: Internal Medicine

## 2015-02-22 DIAGNOSIS — K5900 Constipation, unspecified: Secondary | ICD-10-CM

## 2015-02-22 DIAGNOSIS — K760 Fatty (change of) liver, not elsewhere classified: Secondary | ICD-10-CM

## 2015-02-22 HISTORY — DX: Fatty (change of) liver, not elsewhere classified: K76.0

## 2015-02-22 HISTORY — DX: Constipation, unspecified: K59.00

## 2015-02-22 LAB — H. PYLORI ANTIBODY, IGG: H PYLORI IGG: NEGATIVE

## 2015-02-22 LAB — COMPREHENSIVE METABOLIC PANEL
ALK PHOS: 59 U/L (ref 39–117)
ALT: 30 U/L (ref 0–53)
AST: 24 U/L (ref 0–37)
Albumin: 4 g/dL (ref 3.5–5.2)
BILIRUBIN TOTAL: 0.4 mg/dL (ref 0.2–1.2)
BUN: 12 mg/dL (ref 6–23)
CO2: 28 meq/L (ref 19–32)
CREATININE: 1.12 mg/dL (ref 0.40–1.50)
Calcium: 9.6 mg/dL (ref 8.4–10.5)
Chloride: 105 mEq/L (ref 96–112)
GFR: 71.68 mL/min (ref >60.00)
GLUCOSE: 73 mg/dL (ref 70–99)
Potassium: 4.6 mEq/L (ref 3.5–5.1)
SODIUM: 141 meq/L (ref 135–145)
TOTAL PROTEIN: 7 g/dL (ref 6.0–8.3)

## 2015-02-22 LAB — CBC WITH DIFFERENTIAL/PLATELET
BASOS ABS: 0.1 10*3/uL (ref 0.0–0.1)
Basophils Relative: 1 % (ref 0.0–3.0)
EOS PCT: 1.2 % (ref 0.0–5.0)
Eosinophils Absolute: 0.1 10*3/uL (ref 0.0–0.7)
HEMATOCRIT: 42.8 % (ref 39.0–52.0)
Hemoglobin: 14.6 g/dL (ref 13.0–17.0)
LYMPHS ABS: 1.6 10*3/uL (ref 0.7–4.0)
LYMPHS PCT: 26.6 % (ref 12.0–46.0)
MCHC: 34.2 g/dL (ref 30.0–36.0)
MCV: 88.3 fl (ref 78.0–100.0)
MONOS PCT: 6.6 % (ref 3.0–12.0)
Monocytes Absolute: 0.4 10*3/uL (ref 0.1–1.0)
NEUTROS ABS: 3.8 10*3/uL (ref 1.4–7.7)
NEUTROS PCT: 64.6 % (ref 43.0–77.0)
PLATELETS: 170 10*3/uL (ref 150.0–400.0)
RBC: 4.84 Mil/uL (ref 4.22–5.81)
RDW: 12.9 % (ref 11.5–15.5)
WBC: 5.9 10*3/uL (ref 4.0–10.5)

## 2015-02-22 LAB — CK: Total CK: 121 U/L (ref 7–232)

## 2015-02-22 LAB — IRON AND TIBC
%SAT: 49 % (ref 15–60)
Iron: 116 ug/dL (ref 50–180)
TIBC: 236 ug/dL — AB (ref 250–425)
UIBC: 120 ug/dL — AB (ref 125–400)

## 2015-02-22 LAB — TESTOSTERONE, FREE, TOTAL, SHBG
Sex Hormone Binding: 31 nmol/L (ref 22–77)
Testosterone, Free: 49.7 pg/mL (ref 47.0–244.0)
Testosterone-% Free: 2 % (ref 1.6–2.9)
Testosterone: 248 ng/dL — ABNORMAL LOW (ref 300–890)

## 2015-02-22 LAB — HEPATITIS PANEL, ACUTE
HCV Ab: NEGATIVE
HEP A IGM: NONREACTIVE
HEP B S AG: NEGATIVE
Hep B C IgM: NONREACTIVE

## 2015-02-22 LAB — TSH: TSH: 1.88 u[IU]/mL (ref 0.35–4.50)

## 2015-02-22 LAB — VITAMIN B12: Vitamin B-12: 419 pg/mL (ref 211–911)

## 2015-02-22 MED ORDER — FAMOTIDINE 40 MG PO TABS: 40.0000 mg | ORAL_TABLET | Freq: Every day | ORAL | Status: DC

## 2015-02-22 NOTE — Assessment & Plan Note (Addendum)
New onset, using lactulose to move bowels.  Etiology unclear. Checking lytes,  Thyroid.  Motility may be an ussie given post prandial bloating.  Gastric emptying study to be ordered if labs are normal .  Lab Results  Component Value Date   TSH 1.88 02/21/2015   Lab Results  Component Value Date   NA 141 02/21/2015   K 4.6 02/21/2015   CL 105 02/21/2015   CO2 28 02/21/2015

## 2015-02-22 NOTE — Assessment & Plan Note (Addendum)
By CT scan .  Liver enzymes are persistently normal, and there is a remote history of anabolic steroid use and a more recent history of overweight. Iron stores are slightly elevated but CBC is normal.  Referral to GI for evaluation given multiple GI symptoms,  following gastric emptying study.   Lab Results  Component Value Date   ALT 30 02/21/2015   AST 24 02/21/2015   ALKPHOS 59 02/21/2015   BILITOT 0.4 02/21/2015

## 2015-02-22 NOTE — Assessment & Plan Note (Signed)
He appears to have a recurrence of mood disorder with MDD , mild.  Will recommend resuming wellbutrin after full evaluation and rule out of other causes of malaise and fatigue including sleep study

## 2015-02-23 ENCOUNTER — Telehealth: Payer: Self-pay | Admitting: Gastroenterology

## 2015-02-23 ENCOUNTER — Other Ambulatory Visit: Payer: Self-pay | Admitting: Internal Medicine

## 2015-02-23 ENCOUNTER — Encounter: Payer: Self-pay | Admitting: Internal Medicine

## 2015-02-23 DIAGNOSIS — K5901 Slow transit constipation: Secondary | ICD-10-CM

## 2015-02-23 DIAGNOSIS — K5909 Other constipation: Secondary | ICD-10-CM

## 2015-02-23 DIAGNOSIS — K219 Gastro-esophageal reflux disease without esophagitis: Secondary | ICD-10-CM

## 2015-02-23 DIAGNOSIS — R140 Abdominal distension (gaseous): Secondary | ICD-10-CM

## 2015-02-23 NOTE — Telephone Encounter (Signed)
I have called pt to make an appointment per referral from PCP. Pt needs appointment for GI--Gastroesophageal reflux disease without esophagitis--Bloating--Slow transit constipation.  No answer. I have left a message on voicemail.

## 2015-02-28 ENCOUNTER — Ambulatory Visit
Admission: RE | Admit: 2015-02-28 | Discharge: 2015-02-28 | Disposition: A | Discharge status: Home / Self Care | Payer: BC Managed Care – PPO | Source: Ambulatory Visit | Attending: Internal Medicine | Admitting: Internal Medicine

## 2015-02-28 DIAGNOSIS — R110 Nausea: Secondary | ICD-10-CM | POA: Insufficient documentation

## 2015-02-28 DIAGNOSIS — K5909 Other constipation: Secondary | ICD-10-CM

## 2015-02-28 MED ORDER — TECHNETIUM TC 99M SULFUR COLLOID
2.0000 | Freq: Once | INTRAVENOUS | Status: DC | PRN
  Administered 2015-02-28: 2.19 via INTRAVENOUS
  Filled 2015-02-28: qty 2

## 2015-03-02 ENCOUNTER — Encounter: Payer: Self-pay | Admitting: Internal Medicine

## 2015-03-07 ENCOUNTER — Telehealth: Payer: Self-pay | Admitting: *Deleted

## 2015-03-07 NOTE — Telephone Encounter (Signed)
Patient has requested a medication refill for lactulose.

## 2015-03-15 ENCOUNTER — Encounter: Payer: Self-pay | Admitting: Gastroenterology

## 2015-03-15 ENCOUNTER — Ambulatory Visit (INDEPENDENT_AMBULATORY_CARE_PROVIDER_SITE_OTHER): Payer: BC Managed Care – PPO | Admitting: Gastroenterology

## 2015-03-15 VITALS — BP 128/84 | HR 75 | Temp 98.0°F | Ht 71.0 in | Wt 197.0 lb

## 2015-03-15 DIAGNOSIS — R634 Abnormal weight loss: Secondary | ICD-10-CM | POA: Diagnosis not present

## 2015-03-15 DIAGNOSIS — R197 Diarrhea, unspecified: Secondary | ICD-10-CM

## 2015-03-15 NOTE — Progress Notes (Signed)
Primary Care Physician: Crecencio Mc, MD  Primary Gastroenterologist:  Dr. Lucilla Lame  Chief Complaint  Patient presents with  . Follow-up    Abdominal Bloating    HPI: Seth Doyle is a 59 y.o. male here Due to loose bowel movements and weight loss. The patient states that he started working out and the last few months has been having unexplained weight loss. The patient also reports that he has had abdominal bloating after he eats. He is very concerned that his testosterone level is low. 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 He now reports that he has a very low appetite and feels that he is just not himself. The patient also stopped taking the Jansen for concern of bacterial overgrowth as the cause of his weight loss and Change in bowel habits. He reports that when he does eat he feels like it sits in his stomach and does not empty. The patient was sent off for gastric emptying study which was reported to be normal.  Current Outpatient Prescriptions  Medication Sig Dispense Refill  . DEXILANT 60 MG capsule   1  . famotidine (PEPCID) 40 MG tablet Take 1 tablet (40 mg total) by mouth at bedtime. 90 tablet 0  . lactulose, encephalopathy, (CHRONULAC) 10 GM/15ML SOLN take 15 milliliters by mouth twice a day if needed for MILD CONSTIPATION 240 mL 3  . tamsulosin (FLOMAX) 0.4 MG CAPS capsule Take 1 capsule (0.4 mg total) by mouth daily. 30 capsule 3  . zolpidem (AMBIEN) 10 MG tablet take 1 tablet by mouth at bedtime if needed 30 tablet 3   No current facility-administered medications for this visit.    Allergies as of 03/15/2015 - Review Complete 03/15/2015  Allergen Reaction Noted  . Wellbutrin [bupropion] Itching 07/02/2014    ROS:  General: Negative for anorexia, weight loss, fever, chills, fatigue, weakness. ENT: Negative for hoarseness, difficulty swallowing , nasal congestion. CV: Negative for chest pain, angina,  palpitations, dyspnea on exertion, peripheral edema.  Respiratory: Negative for dyspnea at rest, dyspnea on exertion, cough, sputum, wheezing.  GI: See history of present illness. GU:  Negative for dysuria, hematuria, urinary incontinence, urinary frequency, nocturnal urination.  Endo: Negative for unusual weight change.    Physical Examination:   BP 128/84 mmHg  Pulse 75  Temp(Src) 98 F (36.7 C) (Oral)  Ht 5\' 11"  (1.803 m)  Wt 197 lb (89.359 kg)  BMI 27.49 kg/m2  General: Well-nourished, well-developed in no acute distress.  Eyes: No icterus. Conjunctivae pink. Mouth: Oropharyngeal mucosa moist and pink , no lesions erythema or exudate. Lungs: Clear to auscultation bilaterally. Non-labored. Heart: Regular rate and rhythm, no murmurs rubs or gallops.  Abdomen: Bowel sounds are normal, nontender, nondistended, no hepatosplenomegaly or masses, no abdominal bruits or hernia , no rebound or guarding.   Extremities: No lower extremity edema. No clubbing or deformities. Neuro: Alert and oriented x 3.  Grossly intact. Skin: Warm and dry, no jaundice.   Psych: Alert and cooperative, normal mood and affect.  Labs:    Imaging Studies: Nm Gastric Emptying  02/28/2015  CLINICAL DATA:  Nausea and abdominal pain EXAM: NUCLEAR MEDICINE GASTRIC EMPTYING SCAN TECHNIQUE: After oral ingestion of radiolabeled meal, sequential abdominal images were obtained for 4 hours. Percentage of activity emptying the stomach was calculated at 1 hour, 2 hour, 3 hour, and 4 hours. RADIOPHARMACEUTICALS:  2.19 mCi Tc-12m MDP labeled sulfur colloid orally COMPARISON:  None. FINDINGS: Expected location of the stomach in the left upper quadrant. Ingested meal empties the stomach gradually over the course of the study. Fifty emptied at 1 hr ( normal >= 10%) Seventy-eight emptied at 2 hr ( normal >= 40%) Eighty-six emptied at 3 hr ( normal >= 70%) Ninety-nine emptied at 4 hr ( normal >= 90%) IMPRESSION: Normal gastric  emptying study. Electronically Signed   By: Inez Catalina M.D.   On: 02/28/2015 11:56   Ct Abdomen Pelvis W Contrast  02/17/2015  CLINICAL DATA:  Abdominal discomfort for a few days with difficulty urinating, history of diverticulitis and prior colectomy EXAM: CT ABDOMEN AND PELVIS WITH CONTRAST TECHNIQUE: Multidetector CT imaging of the abdomen and pelvis was performed using the standard protocol following bolus administration of intravenous contrast. CONTRAST:  168mL OMNIPAQUE IOHEXOL 300 MG/ML  SOLN COMPARISON:  11/17/2010 FINDINGS: The lung bases are free of acute infiltrate or sizable effusion. The liver is diffusely decreased in attenuation consistent with fatty infiltration. The spleen, gallbladder, adrenal glands and pancreas are within normal limits with the exception of a right adrenal lesion measuring 19 mm in greatest dimension. This is increased in size from the prior exam but still likely represents an adenoma. Kidneys demonstrate a normal enhancement pattern. No renal calculi or obstructive changes are seen. The ureters are well visualized bilaterally and no calculi are noted. The appendix is within normal limits. Diverticular change of the colon is noted as well as changes of prior partial colectomy in the sigmoid colon. The anastomotic site appears patent. No free pelvic fluid is seen. No findings to suggest diverticulitis are seen. Bony structures are within normal limits for the patient's given age. The bladder is partially distended. No pelvic mass lesion or sidewall abnormality is noted. IMPRESSION: Diverticulosis without diverticulitis. Changes of prior sigmoid colectomy are noted. 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 No other focal abnormality is seen. Electronically Signed   By: Inez Catalina M.D.   On: 02/17/2015 15:04   Dg Abd 2 Views  03/01/2015  CLINICAL DATA:  Abdominal pain, bloating and constipation. History of diverticulitis. EXAM: ABDOMEN - 2 VIEW COMPARISON:  CT  abdomen pelvis - 02/17/2015; nuclear medicine gastric emptying study-02/28/2015 FINDINGS: Nonobstructive bowel gas pattern. No pneumoperitoneum, pneumatosis or portal venous gas. Unremarkable colonic stool burden. Enteric staple line overlies the left mid hemi pelvis. Limited visualization of lower thorax is normal. No acute osseus abnormalities. Mild scoliotic curvature of the thoracolumbar spine with associated mild to moderate multilevel lumbar spine DDD, incompletely evaluated. IMPRESSION: Nonobstructive bowel gas pattern. Unremarkable colonic stool burden. Electronically Signed   By: Sandi Mariscal M.D.   On: 03/01/2015 08:37    Assessment and Plan:   HARROLD FITCHETT is a 59 y.o. y/o male who comes in today with a change in bowel habits and weight loss. The patient is concerned about possible bacterial overgrowth and malabsorption. The patient will have his stool sent off for the stool elastase and fecal fat. The patient may need a breath test for bacterial overgrowth it the Fecal fat comes back positive.The patient will also have his blood sent off for celiac sprue antibodies.   Note: This dictation was prepared with Dragon dictation along with smaller phrase technology. Any transcriptional errors that result from this process are unintentional.

## 2015-03-16 ENCOUNTER — Other Ambulatory Visit: Payer: Self-pay

## 2015-03-16 ENCOUNTER — Telehealth: Payer: Self-pay

## 2015-03-16 DIAGNOSIS — R197 Diarrhea, unspecified: Secondary | ICD-10-CM

## 2015-03-16 NOTE — Telephone Encounter (Signed)
-----   Message from Lucilla Lame, MD sent at 03/15/2015  7:23 PM EDT ----- Forgot to get this patient to do blood tests for sprue. Please set that up. Thanks.

## 2015-03-16 NOTE — Telephone Encounter (Signed)
Contacted [facility] lab dept and was able to add Early Chars Sprue test on labs that were done.

## 2015-03-18 ENCOUNTER — Other Ambulatory Visit
Admission: RE | Admit: 2015-03-18 | Discharge: 2015-03-18 | Disposition: A | Discharge status: Home / Self Care | Payer: BC Managed Care – PPO | Source: Ambulatory Visit | Attending: Gastroenterology | Admitting: Gastroenterology

## 2015-03-18 DIAGNOSIS — R197 Diarrhea, unspecified: Secondary | ICD-10-CM | POA: Diagnosis present

## 2015-03-18 DIAGNOSIS — R634 Abnormal weight loss: Secondary | ICD-10-CM | POA: Diagnosis not present

## 2015-03-21 LAB — FECAL FAT, QUALITATIVE
FAT QUAL NEUTRAL STL: NORMAL
FAT QUAL TOTAL STL: INCREASED

## 2015-03-22 ENCOUNTER — Ambulatory Visit: Payer: Self-pay | Admitting: Gastroenterology

## 2015-03-22 LAB — PANCREATIC ELASTASE, FECAL: Pancreatic Elastase-1, Stool: 288 ug Elast./g (ref >200)

## 2015-03-23 ENCOUNTER — Other Ambulatory Visit: Payer: Self-pay

## 2015-03-23 ENCOUNTER — Telehealth: Payer: Self-pay

## 2015-03-23 DIAGNOSIS — K589 Irritable bowel syndrome without diarrhea: Secondary | ICD-10-CM

## 2015-03-23 MED ORDER — RIFAXIMIN 550 MG PO TABS: 550.0000 mg | ORAL_TABLET | Freq: Two times a day (BID) | ORAL | Status: DC

## 2015-03-23 NOTE — Telephone Encounter (Signed)
-----   Message from Lucilla Lame, MD sent at 03/23/2015  9:01 AM EDT ----- The patient had increased fat in his stool which could be from bacterial overgrowth. Treat with rifaximin 550mg  bid. For 21 days.

## 2015-03-23 NOTE — Telephone Encounter (Signed)
Pt has been notified of lab results. Rx has been sent to pt's pharmacy.

## 2015-03-29 ENCOUNTER — Other Ambulatory Visit: Payer: Self-pay | Admitting: Internal Medicine

## 2015-03-31 ENCOUNTER — Encounter: Payer: Self-pay | Admitting: Internal Medicine

## 2015-03-31 NOTE — Telephone Encounter (Signed)
Ok to refill,  printed rx  

## 2015-04-17 LAB — HM COLONOSCOPY

## 2015-04-18 ENCOUNTER — Telehealth: Payer: Self-pay | Admitting: Gastroenterology

## 2015-04-18 NOTE — Telephone Encounter (Signed)
Yes please have him come in to see if his symptoms are better.

## 2015-04-18 NOTE — Telephone Encounter (Signed)
Patient has finished his 21 day medication for overgrowth bacterial growth in colon. He stated that his wife has concerns of a fermenting smell on breath and a diet that he may should be on. He would also like to know if he should be following back up in the office since he has finished his round of antibiotic. Please call patient back at [phone]. He states that he may not have good cell phone service at work, however if you leave a message he will return the call ASAP.

## 2015-04-18 NOTE — Telephone Encounter (Signed)
Pt was given Rifaximin 550mg  for 21 days. Please advise if he needs a follow up appt and about wife's concerns below.

## 2015-04-19 NOTE — Telephone Encounter (Signed)
Pt has been scheduled for a follow up appt on Dec 5th in [place].

## 2015-04-25 ENCOUNTER — Ambulatory Visit: Payer: Self-pay | Admitting: Gastroenterology

## 2015-04-25 ENCOUNTER — Ambulatory Visit (INDEPENDENT_AMBULATORY_CARE_PROVIDER_SITE_OTHER): Payer: BC Managed Care – PPO | Admitting: Gastroenterology

## 2015-04-25 ENCOUNTER — Encounter: Payer: Self-pay | Admitting: Gastroenterology

## 2015-04-25 VITALS — BP 149/86 | HR 70 | Temp 98.2°F | Ht 71.0 in | Wt 202.0 lb

## 2015-04-25 DIAGNOSIS — K6389 Other specified diseases of intestine: Secondary | ICD-10-CM

## 2015-04-25 NOTE — Progress Notes (Signed)
Primary Care Physician: Seth Mc, MD  Primary Gastroenterologist:  Dr. Lucilla Doyle  Chief Complaint  Patient presents with  . Follow up medicatioin    HPI: Seth Doyle is a 59 y.o. male here for follow-up after possible bacterial overgrowth when his stools were found to have increased fat in them. The patient was taking Xifaxan for this and states that he has been feeling well and reports his energy is back up and he has started to work out again. The patient also reports that his weight has been stable recently. He denies any diarrhea and states that he is a little more constipated than he has been in the past. The patient is concerned because his wife states that his breath smells like fermentation. The patient has no abdominal pain and bloating fevers or chills. The patient also had a tubular adenoma from a colonoscopy back in December 2011.  Current Outpatient Prescriptions  Medication Sig Dispense Refill  . DEXILANT 60 MG capsule   1  . famotidine (PEPCID) 40 MG tablet Take 1 tablet (40 mg total) by mouth at bedtime. 90 tablet 0  . lactulose, encephalopathy, (CHRONULAC) 10 GM/15ML SOLN take 15 milliliters by mouth twice a day if needed for MILD CONSTIPATION 240 mL 3  . tamsulosin (FLOMAX) 0.4 MG CAPS capsule Take 1 capsule (0.4 mg total) by mouth daily. 30 capsule 3  . zolpidem (AMBIEN) 10 MG tablet take 1 tablet by mouth at bedtime if needed 30 tablet 3  . doxazosin (CARDURA) 1 MG tablet     . rifaximin (XIFAXAN) 550 MG TABS tablet Take 1 tablet (550 mg total) by mouth 2 (two) times daily. (Patient not taking: Reported on 04/25/2015) 42 tablet 0   No current facility-administered medications for this visit.    Allergies as of 04/25/2015 - Review Complete 04/25/2015  Allergen Reaction Noted  . Wellbutrin [bupropion] Itching 07/02/2014    ROS:  General: Negative for anorexia, weight loss, fever, chills, fatigue, weakness. ENT: Negative for hoarseness, difficulty  swallowing , nasal congestion. CV: Negative for chest pain, angina, palpitations, dyspnea on exertion, peripheral edema.  Respiratory: Negative for dyspnea at rest, dyspnea on exertion, cough, sputum, wheezing.  GI: See history of present illness. GU:  Negative for dysuria, hematuria, urinary incontinence, urinary frequency, nocturnal urination.  Endo: Negative for unusual weight change.    Physical Examination:   BP 149/86 mmHg  Pulse 70  Temp(Src) 98.2 F (36.8 C) (Oral)  Ht 5\' 11"  (1.803 m)  Wt 202 lb (91.627 kg)  BMI 28.19 kg/m2  General: Well-nourished, well-developed in no acute distress.  Eyes: No icterus. Conjunctivae pink. Neuro: Alert and oriented x 3.  Grossly intact. Skin: Warm and dry, no jaundice.   Psych: Alert and cooperative, normal mood and affect.  Labs:    Imaging Studies: No results found.  Assessment and Plan:   Seth Doyle is a 59 y.o. y/o male who comes in with a history of being treated for bacterial overgrowth with his symptoms improving after having taken antibiotics. The patient states he is a little more constipated than he has been in the past and he also reports that his wife states that his breath is sometimes bad which she thinks may be fermentation. The patient has no signs of continued active overgrowth and reports that he is feeling better and started working out again. The patient has been told he given more time to have his body readjusts. He will also be set up  for colonoscopy due to his history of colon polyps.I have discussed risks & benefits which include, but are not limited to, bleeding, infection, perforation & drug reaction.  The patient agrees with this plan & written consent will be obtained.       Note: This dictation was prepared with Dragon dictation along with smaller phrase technology. Any transcriptional errors that result from this process are unintentional.

## 2015-05-02 ENCOUNTER — Encounter: Payer: Self-pay | Admitting: *Deleted

## 2015-05-02 ENCOUNTER — Encounter: Payer: Self-pay | Admitting: Internal Medicine

## 2015-05-03 NOTE — Discharge Instructions (Signed)

## 2015-05-06 ENCOUNTER — Ambulatory Visit: Payer: BC Managed Care – PPO | Admitting: Anesthesiology

## 2015-05-06 ENCOUNTER — Encounter
Admission: RE | Disposition: A | Discharge status: Home / Self Care | Payer: Self-pay | Source: Ambulatory Visit | Attending: Gastroenterology

## 2015-05-06 ENCOUNTER — Ambulatory Visit
Admission: RE | Admit: 2015-05-06 | Discharge: 2015-05-06 | Disposition: A | Discharge status: Home / Self Care | Payer: BC Managed Care – PPO | Source: Ambulatory Visit | Attending: Gastroenterology | Admitting: Gastroenterology

## 2015-05-06 ENCOUNTER — Other Ambulatory Visit: Payer: Self-pay | Admitting: Gastroenterology

## 2015-05-06 DIAGNOSIS — Z860100 Personal history of colon polyps, unspecified: Secondary | ICD-10-CM | POA: Insufficient documentation

## 2015-05-06 DIAGNOSIS — Z820 Family history of epilepsy and other diseases of the nervous system: Secondary | ICD-10-CM | POA: Diagnosis not present

## 2015-05-06 DIAGNOSIS — Z825 Family history of asthma and other chronic lower respiratory diseases: Secondary | ICD-10-CM | POA: Insufficient documentation

## 2015-05-06 DIAGNOSIS — I10 Essential (primary) hypertension: Secondary | ICD-10-CM | POA: Insufficient documentation

## 2015-05-06 DIAGNOSIS — G4700 Insomnia, unspecified: Secondary | ICD-10-CM | POA: Diagnosis not present

## 2015-05-06 DIAGNOSIS — Z79899 Other long term (current) drug therapy: Secondary | ICD-10-CM | POA: Insufficient documentation

## 2015-05-06 DIAGNOSIS — K5900 Constipation, unspecified: Secondary | ICD-10-CM | POA: Diagnosis not present

## 2015-05-06 DIAGNOSIS — D123 Benign neoplasm of transverse colon: Secondary | ICD-10-CM | POA: Insufficient documentation

## 2015-05-06 DIAGNOSIS — Z818 Family history of other mental and behavioral disorders: Secondary | ICD-10-CM | POA: Diagnosis not present

## 2015-05-06 DIAGNOSIS — R42 Dizziness and giddiness: Secondary | ICD-10-CM | POA: Diagnosis not present

## 2015-05-06 DIAGNOSIS — M1990 Unspecified osteoarthritis, unspecified site: Secondary | ICD-10-CM | POA: Diagnosis not present

## 2015-05-06 DIAGNOSIS — K5730 Diverticulosis of large intestine without perforation or abscess without bleeding: Secondary | ICD-10-CM | POA: Insufficient documentation

## 2015-05-06 DIAGNOSIS — Z833 Family history of diabetes mellitus: Secondary | ICD-10-CM | POA: Diagnosis not present

## 2015-05-06 DIAGNOSIS — Z980 Intestinal bypass and anastomosis status: Secondary | ICD-10-CM | POA: Insufficient documentation

## 2015-05-06 DIAGNOSIS — R339 Retention of urine, unspecified: Secondary | ICD-10-CM | POA: Diagnosis not present

## 2015-05-06 DIAGNOSIS — K760 Fatty (change of) liver, not elsewhere classified: Secondary | ICD-10-CM | POA: Insufficient documentation

## 2015-05-06 DIAGNOSIS — N400 Enlarged prostate without lower urinary tract symptoms: Secondary | ICD-10-CM | POA: Diagnosis not present

## 2015-05-06 DIAGNOSIS — K210 Gastro-esophageal reflux disease with esophagitis: Secondary | ICD-10-CM | POA: Diagnosis not present

## 2015-05-06 HISTORY — PX: COLONOSCOPY WITH PROPOFOL: SHX5780

## 2015-05-06 HISTORY — DX: Dizziness and giddiness: R42

## 2015-05-06 HISTORY — DX: Presence of spectacles and contact lenses: Z97.3

## 2015-05-06 HISTORY — PX: POLYPECTOMY: SHX5525

## 2015-05-06 SURGERY — COLONOSCOPY WITH PROPOFOL
Anesthesia: Monitor Anesthesia Care | Wound class: Contaminated

## 2015-05-06 MED ORDER — PROPOFOL 10 MG/ML IV BOLUS
INTRAVENOUS | Status: DC | PRN
  Administered 2015-05-06: 50 mg via INTRAVENOUS
  Administered 2015-05-06: 20 mg via INTRAVENOUS
  Administered 2015-05-06: 50 mg via INTRAVENOUS
  Administered 2015-05-06 (×2): 30 mg via INTRAVENOUS
  Administered 2015-05-06 (×2): 20 mg via INTRAVENOUS

## 2015-05-06 MED ORDER — LIDOCAINE HCL (CARDIAC) 20 MG/ML IV SOLN
INTRAVENOUS | Status: DC | PRN
  Administered 2015-05-06: 30 mg via INTRAVENOUS

## 2015-05-06 MED ORDER — LACTATED RINGERS IV SOLN
INTRAVENOUS | Status: DC
  Administered 2015-05-06: 11:00:00 via INTRAVENOUS

## 2015-05-06 MED ORDER — STERILE WATER FOR IRRIGATION IR SOLN
Status: DC | PRN
  Administered 2015-05-06: 11:00:00

## 2015-05-06 SURGICAL SUPPLY — 28 items
CANISTER SUCT 1200ML W/VALVE (MISCELLANEOUS) ×4 IMPLANT
FCP ESCP3.2XJMB 240X2.8X (MISCELLANEOUS)
FORCEPS BIOP RAD 4 LRG CAP 4 (CUTTING FORCEPS) IMPLANT
FORCEPS BIOP RJ4 240 W/NDL (MISCELLANEOUS)
FORCEPS ESCP3.2XJMB 240X2.8X (MISCELLANEOUS) IMPLANT
GOWN CVR UNV OPN BCK APRN NK (MISCELLANEOUS) ×4 IMPLANT
GOWN ISOL THUMB LOOP REG UNIV (MISCELLANEOUS) ×4
HEMOCLIP INSTINCT (CLIP) IMPLANT
INJECTOR VARIJECT VIN23 (MISCELLANEOUS) IMPLANT
KIT CO2 TUBING (TUBING) IMPLANT
KIT DEFENDO VALVE AND CONN (KITS) IMPLANT
KIT ENDO PROCEDURE OLY (KITS) ×4 IMPLANT
LIGATOR MULTIBAND 6SHOOTER MBL (MISCELLANEOUS) IMPLANT
MARKER SPOT ENDO TATTOO 5ML (MISCELLANEOUS) IMPLANT
PAD GROUND ADULT SPLIT (MISCELLANEOUS) IMPLANT
SNARE SHORT THROW 13M SML OVAL (MISCELLANEOUS) ×4 IMPLANT
SNARE SHORT THROW 30M LRG OVAL (MISCELLANEOUS) IMPLANT
SPOT EX ENDOSCOPIC TATTOO (MISCELLANEOUS)
SUCTION POLY TRAP 4CHAMBER (MISCELLANEOUS) ×4 IMPLANT
TRAP SUCTION POLY (MISCELLANEOUS) ×4 IMPLANT
TUBING CONN 6MMX3.1M (TUBING)
TUBING SUCTION CONN 0.25 STRL (TUBING) IMPLANT
UNDERPAD 30X60 958B10 (PK) (MISCELLANEOUS) IMPLANT
VALVE BIOPSY ENDO (VALVE) IMPLANT
VARIJECT INJECTOR VIN23 (MISCELLANEOUS)
WATER AUXILLARY (MISCELLANEOUS) IMPLANT
WATER STERILE IRR 250ML POUR (IV SOLUTION) ×4 IMPLANT
WATER STERILE IRR 500ML POUR (IV SOLUTION) IMPLANT

## 2015-05-06 NOTE — Anesthesia Preprocedure Evaluation (Signed)
Anesthesia Evaluation  Patient identified by MRN, date of birth, ID band  Reviewed: Allergy & Precautions, H&P , NPO status , Patient's Chart, lab work & pertinent test results  Airway Mallampati: II  TM Distance: >3 FB Neck ROM: full    Dental no notable dental hx.    Pulmonary    Pulmonary exam normal       Cardiovascular hypertension, Rhythm:regular Rate:Normal     Neuro/Psych    GI/Hepatic   Endo/Other    Renal/GU      Musculoskeletal   Abdominal   Peds  Hematology   Anesthesia Other Findings   Reproductive/Obstetrics                             Anesthesia Physical Anesthesia Plan  ASA: II  Anesthesia Plan: MAC   Post-op Pain Management:    Induction:   Airway Management Planned:   Additional Equipment:   Intra-op Plan:   Post-operative Plan:   Informed Consent: I have reviewed the patients History and Physical, chart, labs and discussed the procedure including the risks, benefits and alternatives for the proposed anesthesia with the patient or authorized representative who has indicated his/her understanding and acceptance.     Plan Discussed with: CRNA  Anesthesia Plan Comments:         Anesthesia Quick Evaluation  

## 2015-05-06 NOTE — Anesthesia Postprocedure Evaluation (Signed)
Anesthesia Post Note  Patient: Seth Doyle  Procedure(s) Performed: Procedure(s) (LRB): COLONOSCOPY WITH PROPOFOL (N/A) POLYPECTOMY  Patient location during evaluation: PACU Anesthesia Type: MAC Level of consciousness: awake and alert and oriented Pain management: satisfactory to patient Vital Signs Assessment: post-procedure vital signs reviewed and stable Respiratory status: spontaneous breathing, nonlabored ventilation and respiratory function stable Cardiovascular status: blood pressure returned to baseline and stable Postop Assessment: Adequate PO intake and No signs of nausea or vomiting Anesthetic complications: no    Raliegh Ip

## 2015-05-06 NOTE — Anesthesia Procedure Notes (Signed)
Procedure Name: MAC Performed by: Albertine Lafoy Pre-anesthesia Checklist: Patient identified, Emergency Drugs available, Suction available, Patient being monitored and Timeout performed Patient Re-evaluated:Patient Re-evaluated prior to inductionOxygen Delivery Method: Nasal cannula Placement Confirmation: positive ETCO2 and breath sounds checked- equal and bilateral     

## 2015-05-06 NOTE — H&P (Signed)
[facility]  [address], [place], [place] [address] Phone: [phone] Fax : [phone]  Primary Care Physician:  Crecencio Mc, MD Primary Gastroenterologist:  Dr. Allen Norris  Pre-Procedure History & Physical: HPI:  Seth Doyle is a 59 y.o. male is here for an colonoscopy.   Past Medical History  Diagnosis Date  . Diverticulitis   . Diverticulitis of sigmoid colon 02/04/2011  . Reflux esophagitis 09/10/2012  . Constipation 02/22/2015  . Hepatic steatosis 02/22/2015  . Subacromial bursitis 09/18/2013  . Left rotator cuff tear 10/30/2013  . 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 Urinary retention due to benign prostatic hyperplasia 04/27/2014  . Chronic insomnia 03/26/2014    Managed with generic ambien.     . Essential hypertension 04/27/2014    related to event. no longer on medicines.  . Vertigo     3 episodes.  last one approx 6 mos ago  . Wears contact lenses     Past Surgical History  Procedure Laterality Date  . Rhinoplasty  1992    deviated septum due to volleyball  . Rotator cuff repair  1991    Dr. Marry Guan  . Shoulder arthroscopy w/ rotator cuff repair  1991    [facility]  . Rhinoplasty  1992    foe deviated septm, traumatic  [facility]   . Colon surgery  August 2012    sigmoid colectomy, diverticulitis  . Eye surgery      tear duct repair     Prior to Admission medications   Medication Sig Start Date End Date Taking? Authorizing Provider  B Complex Vitamins (VITAMIN B COMPLEX PO) Take by mouth.   Yes Historical Provider, MD  famotidine (PEPCID) 40 MG tablet Take 1 tablet (40 mg total) by mouth at bedtime. Patient taking differently: Take 40 mg by mouth as needed.  02/22/15  Yes Crecencio Mc, MD  MAGNESIUM PO Take by mouth.   Yes Historical Provider, MD  Multiple Vitamin (MULTIVITAMIN) tablet Take 1 tablet by mouth daily.   Yes Historical Provider, MD  Multiple Vitamins-Minerals (ZINC PO) Take by mouth.   Yes Historical Provider, MD    tamsulosin (FLOMAX) 0.4 MG CAPS capsule Take 1 capsule (0.4 mg total) by mouth daily. 01/15/15  Yes Crecencio Mc, MD  zolpidem (AMBIEN) 10 MG tablet take 1 tablet by mouth at bedtime if needed 03/31/15  Yes Crecencio Mc, MD  DEXILANT 60 MG capsule  01/28/15   Historical Provider, MD  doxazosin (CARDURA) 1 MG tablet  01/31/15   Historical Provider, MD  lactulose, encephalopathy, (CHRONULAC) 10 GM/15ML SOLN take 15 milliliters by mouth twice a day if needed for MILD CONSTIPATION 02/18/15   Crecencio Mc, MD  rifaximin (XIFAXAN) 550 MG TABS tablet Take 1 tablet (550 mg total) by mouth 2 (two) times daily. Patient not taking: Reported on 04/25/2015 03/23/15   Lucilla Lame, MD    Allergies as of 04/25/2015 - Review Complete 04/25/2015  Allergen Reaction Noted  . Wellbutrin [bupropion] Itching 07/02/2014    Family History  Problem Relation Age of Onset  . Diabetes Mother   . Mental illness Mother     Dementia  . COPD Father   . Hyperlipidemia Father   . Mental illness Father     Parkinson's Disease    Social History   Social History  . Marital Status: Married    Spouse Name: N/A  . Number of Children: N/A  . Years of Education: N/A   Occupational  History  . Not on file.   Social History Main Topics  . Smoking status: Never Smoker   . Smokeless tobacco: Never Used  . Alcohol Use: Yes     Comment: rare - once /year  . Drug Use: No  . Sexual Activity: Yes   Other Topics Concern  . Not on file   Social History Narrative    Review of Systems: See HPI, otherwise negative ROS  Physical Exam: BP 142/95 mmHg  Pulse 78  Temp(Src) 97.9 F (36.6 C)  Resp 16  Ht 5\' 11"  (1.803 m)  Wt 190 lb (86.183 kg)  BMI 26.51 kg/m2  SpO2 98% General:   Alert,  pleasant and cooperative in NAD Head:  Normocephalic and atraumatic. Neck:  Supple; no masses or thyromegaly. Lungs:  Clear throughout to auscultation.    Heart:  Regular rate and rhythm. Abdomen:  Soft, nontender and  nondistended. Normal bowel sounds, without guarding, and without rebound.   Neurologic:  Alert and  oriented x4;  grossly normal neurologically.  Impression/Plan: Seth Doyle is here for an colonoscopy to be performed for history of colon polyp.  Risks, benefits, limitations, and alternatives regarding  colonoscopy have been reviewed with the patient.  Questions have been answered.  All parties agreeable.   Ollen Bowl, MD  05/06/2015, 10:56 AM

## 2015-05-06 NOTE — Transfer of Care (Signed)
Immediate Anesthesia Transfer of Care Note  Patient: Seth Doyle  Procedure(s) Performed: Procedure(s): COLONOSCOPY WITH PROPOFOL (N/A) POLYPECTOMY  Patient Location: PACU  Anesthesia Type: MAC  Level of Consciousness: awake, alert  and patient cooperative  Airway and Oxygen Therapy: Patient Spontanous Breathing and Patient connected to supplemental oxygen  Post-op Assessment: Post-op Vital signs reviewed, Patient's Cardiovascular Status Stable, Respiratory Function Stable, Patent Airway and No signs of Nausea or vomiting  Post-op Vital Signs: Reviewed and stable  Complications: No apparent anesthesia complications

## 2015-05-06 NOTE — Op Note (Signed)
[facility] Patient Name: Seth Doyle Procedure Date: 05/06/2015 10:53 AM MRN: [id number] Account #: [id number] Date of Birth: 1956-04-03 Admit Type: Outpatient Age: 59 Room: [facility] OR ROOM 01 Gender: Male Note Status: Finalized Procedure:         Colonoscopy Indications:       High risk colon cancer surveillance: Personal history of                     colonic polyps Providers:         Lucilla Lame, MD Referring MD:      Deborra Medina, MD (Referring MD) Medicines:         Propofol per Anesthesia Complications:     No immediate complications. Procedure:         Pre-Anesthesia Assessment:                    - Prior to the procedure, a History and Physical was                     performed, and patient medications and allergies were                     reviewed. The patient's tolerance of previous anesthesia                     was also reviewed. The risks and benefits of the procedure                     and the sedation options and risks were discussed with the                     patient. All questions were answered, and informed consent                     was obtained. Prior Anticoagulants: The patient has taken                     no previous anticoagulant or antiplatelet agents. ASA                     Grade Assessment: II - A patient with mild systemic                     disease. After reviewing the risks and benefits, the                     patient was deemed in satisfactory condition to undergo                     the procedure.                    After obtaining informed consent, the colonoscope was                     passed under direct vision. Throughout the procedure, the                     patient's blood pressure, pulse, and oxygen saturations                     were monitored continuously. The Olympus CF-HQ190L  Colonoscope (S#. [id number]) was introduced through the anus                     and advanced to the  the cecum, identified by appendiceal                     orifice and ileocecal valve. The colonoscopy was performed                     without difficulty. The patient tolerated the procedure                     well. The quality of the bowel preparation was excellent. Findings:      The perianal and digital rectal examinations were normal.      There was evidence of a prior end-to-side ileo-colonic anastomosis in       the sigmoid colon. This was patent. This was characterized by healthy       appearing mucosa. This was traversed.      A 6 mm polyp was found in the transverse colon. The polyp was sessile.       The polyp was removed with a cold snare. Resection and retrieval were       complete.      Multiple small-mouthed diverticula were found in the sigmoid colon. Impression:        - Patent end-to-side ileo-colonic anastomosis,                     characterized by healthy appearing mucosa.                    - One 6 mm polyp in the transverse colon. Resected and                     retrieved.                    - Diverticulosis in the sigmoid colon. Recommendation:    - Repeat colonoscopy in 5 years for surveillance. Procedure Code(s): --- Professional ---                    [phone], Colonoscopy, flexible; with removal of tumor(s),                     polyp(s), or other lesion(s) by snare technique Diagnosis Code(s): --- Professional ---                    Z86.010, Personal history of colonic polyps                    Z98.0, Intestinal bypass and anastomosis status                    D12.3, Benign neoplasm of transverse colon CPT copyright 2014 American Medical Association. All rights reserved. The codes documented in this report are preliminary and upon coder review may  be revised to meet current compliance requirements. Lucilla Lame, MD 05/06/2015 11:17:26 AM This report has been signed electronically. Number of Addenda: 0 Note Initiated On: 05/06/2015 10:53 AM Scope Withdrawal  Time: 0 hours 5 minutes 59 seconds  Total Procedure Duration: 0 hours 8 minutes 25 seconds       [facility]

## 2015-05-09 ENCOUNTER — Encounter: Payer: Self-pay | Admitting: Gastroenterology

## 2015-05-11 ENCOUNTER — Encounter: Payer: Self-pay | Admitting: Gastroenterology

## 2015-05-17 ENCOUNTER — Telehealth: Payer: Self-pay | Admitting: Internal Medicine

## 2015-05-17 NOTE — Telephone Encounter (Signed)
Chart updated with colonoscopy

## 2015-06-07 ENCOUNTER — Telehealth: Payer: Self-pay

## 2015-06-07 NOTE — Telephone Encounter (Signed)
PA for Ambien started on Cover my meds.

## 2015-07-01 ENCOUNTER — Encounter: Payer: Self-pay | Admitting: Internal Medicine

## 2015-07-01 ENCOUNTER — Ambulatory Visit (INDEPENDENT_AMBULATORY_CARE_PROVIDER_SITE_OTHER): Payer: BC Managed Care – PPO | Admitting: Internal Medicine

## 2015-07-01 VITALS — BP 130/88 | HR 68 | Temp 98.0°F | Resp 12 | Ht 71.0 in | Wt 208.2 lb

## 2015-07-01 DIAGNOSIS — I10 Essential (primary) hypertension: Secondary | ICD-10-CM | POA: Diagnosis not present

## 2015-07-01 DIAGNOSIS — E291 Testicular hypofunction: Secondary | ICD-10-CM | POA: Diagnosis not present

## 2015-07-01 DIAGNOSIS — K760 Fatty (change of) liver, not elsewhere classified: Secondary | ICD-10-CM

## 2015-07-01 MED ORDER — TESTOSTERONE CYPIONATE 100 MG/ML IM SOLN: 100.0000 mg | INTRAMUSCULAR | Status: DC

## 2015-07-01 MED ORDER — SYRINGE (DISPOSABLE) 1 ML MISC: Status: DC

## 2015-07-01 NOTE — Progress Notes (Addendum)
Subjective:  Patient ID: Seth Doyle, male    DOB: 12-Jun-1955  Age: 60 y.o. MRN: [id number]  CC: The primary encounter diagnosis was Primary hypogonadism in male. Diagnoses of Hepatic steatosis and Essential hypertension were also pertinent to this visit.  HPI Seth Doyle presents for follow up on fatigue, nausea and abd pain first reported  in October.   Gastric emptying study normal.  abd films normal labs normal. 6 mm polyp removed by colonoscopy Dec Wohl. Fecal fat studies suggested small bowel bacterial overgrowth , so he was treated with xifaxin for 3 weeks for SB bacterial  Overgrowth  .  Nausea and abdominal pain have resolved. Marland Kitchen    However he continues to report excessive fatigue on a daily basis,  Muscle wasting and muscle weakness,  Lack of libido,  And insomnia. His testosterone was low during recent check, confirming primary hypogonadism, and he would like to start testosterone therapy.     Outpatient Prescriptions Prior to Visit  Medication Sig Dispense Refill  . B Complex Vitamins (VITAMIN B COMPLEX PO) Take by mouth.    . DEXILANT 60 MG capsule   1  . famotidine (PEPCID) 40 MG tablet Take 1 tablet (40 mg total) by mouth at bedtime. (Patient taking differently: Take 40 mg by mouth as needed. ) 90 tablet 0  . MAGNESIUM PO Take by mouth.    . Multiple Vitamin (MULTIVITAMIN) tablet Take 1 tablet by mouth daily.    . tamsulosin (FLOMAX) 0.4 MG CAPS capsule Take 1 capsule (0.4 mg total) by mouth daily. 30 capsule 3  . zolpidem (AMBIEN) 10 MG tablet take 1 tablet by mouth at bedtime if needed 30 tablet 3  . lactulose, encephalopathy, (CHRONULAC) 10 GM/15ML SOLN take 15 milliliters by mouth twice a day if needed for MILD CONSTIPATION (Patient not taking: Reported on 07/01/2015) 240 mL 3  . doxazosin (CARDURA) 1 MG tablet Reported on 07/01/2015    . Multiple Vitamins-Minerals (ZINC PO) Take by mouth.    . rifaximin (XIFAXAN) 550 MG TABS tablet Take 1 tablet (550 mg total) by  mouth 2 (two) times daily. (Patient not taking: Reported on 04/25/2015) 42 tablet 0   No facility-administered medications prior to visit.    Review of Systems;  Patient denies headache, fevers, malaise, unintentional weight loss, skin rash, eye pain, sinus congestion and sinus pain, sore throat, dysphagia,  hemoptysis , cough, dyspnea, wheezing, chest pain, palpitations, orthopnea, edema, abdominal pain, nausea, melena, diarrhea, constipation, flank pain, dysuria, hematuria, urinary  Frequency, nocturia, numbness, tingling, seizures,  Focal weakness, Loss of consciousness,  Tremor, insomnia, depression, anxiety, and suicidal ideation.      Objective:  BP 130/88 mmHg  Pulse 68  Temp(Src) 98 F (36.7 C) (Oral)  Resp 12  Ht 5\' 11"  (1.803 m)  Wt 208 lb 4 oz (94.462 kg)  BMI 29.06 kg/m2  SpO2 98%  BP Readings from Last 3 Encounters:  07/01/15 130/88  05/06/15 122/91  04/25/15 149/86    Wt Readings from Last 3 Encounters:  07/01/15 208 lb 4 oz (94.462 kg)  05/06/15 190 lb (86.183 kg)  04/25/15 202 lb (91.627 kg)    General appearance: alert, cooperative and appears stated age Ears: normal TM's and external ear canals both ears Throat: lips, mucosa, and tongue normal; teeth and gums normal Neck: no adenopathy, no carotid bruit, supple, symmetrical, trachea midline and thyroid not enlarged, symmetric, no tenderness/mass/nodules Back: symmetric, no curvature. ROM normal. No CVA tenderness. Lungs: clear to  auscultation bilaterally Heart: regular rate and rhythm, S1, S2 normal, no murmur, click, rub or gallop Abdomen: soft, non-tender; bowel sounds normal; no masses,  no organomegaly Pulses: 2+ and symmetric Skin: Skin color, texture, turgor normal. No rashes or lesions Lymph nodes: Cervical, supraclavicular, and axillary nodes normal.  No results found for: HGBA1C  Lab Results  Component Value Date   CREATININE 1.12 02/21/2015   CREATININE 1.20 01/27/2015   CREATININE 1.2  04/30/2014    Lab Results  Component Value Date   WBC 5.9 02/21/2015   HGB 14.6 02/21/2015   HCT 42.8 02/21/2015   PLT 170.0 02/21/2015   GLUCOSE 73 02/21/2015   CHOL 100 02/04/2015   TRIG 47.0 02/04/2015   HDL 37.90* 02/04/2015   LDLCALC 52 02/04/2015   ALT 30 02/21/2015   AST 24 02/21/2015   NA 141 02/21/2015   K 4.6 02/21/2015   CL 105 02/21/2015   CREATININE 1.12 02/21/2015   BUN 12 02/21/2015   CO2 28 02/21/2015   TSH 1.88 02/21/2015   PSA 0.74 04/30/2014   MICROALBUR 0.4 04/30/2014    No results found.  Assessment & Plan:   Problem List Items Addressed This Visit    Primary hypogonadism in male - Primary    Risks and benefits of testosterone supplementation discussed and patient accepts the risks .  Starting dose 100 mg q 2 weeks.  Will need CBC, lTs and repeat testosterone level  around 6 weeks from now halfway between shots. .      Essential hypertension    Borderline,  Currently not treated,  Will recheck in 6 weeks.       Hepatic steatosis    Found in the setting of rapid wt loss of 25 lbs,  By CT scan, Considered to be due to rapid weight loss per GI         A total of 25 minutes of face to face time was spent with patient more than half of which was spent in counselling about the above mentioned conditions  and coordination of care   I have discontinued Seth Doyle's doxazosin, rifaximin, and Multiple Vitamins-Minerals (ZINC PO). I am also having him start on testosterone cypionate and Syringe (Disposable). Additionally, I am having him maintain his tamsulosin, lactulose (encephalopathy), DEXILANT, famotidine, zolpidem, multivitamin, B Complex Vitamins (VITAMIN B COMPLEX PO), and MAGNESIUM PO.  Meds ordered this encounter  Medications  . testosterone cypionate (DEPOTESTOTERONE CYPIONATE) 100 MG/ML injection    Sig: Inject 1 mL (100 mg total) into the muscle every 14 (fourteen) days. For IM use only    Dispense:  10 mL    Refill:  2  . Syringe,  Disposable, 1 ML MISC    Sig: 22 gauge,  For testosterone injections bi weekly    Dispense:  25 each    Refill:  0    Medications Discontinued During This Encounter  Medication Reason  . Multiple Vitamins-Minerals (ZINC PO) Error  . doxazosin (CARDURA) 1 MG tablet Error  . rifaximin (XIFAXAN) 550 MG TABS tablet Completed Course    Follow-up: No Follow-up on file.   Crecencio Mc, MD

## 2015-07-01 NOTE — Progress Notes (Signed)
Pre-visit discussion using our clinic review tool. No additional management support is needed unless otherwise documented below in the visit note.  

## 2015-07-03 DIAGNOSIS — E291 Testicular hypofunction: Secondary | ICD-10-CM | POA: Insufficient documentation

## 2015-07-03 NOTE — Assessment & Plan Note (Signed)
Borderline,  Currently not treated,  Will recheck in 6 weeks.

## 2015-07-03 NOTE — Assessment & Plan Note (Signed)
Risks and benefits of testosterone supplementation discussed and patient accepts the risks .  Starting dose 100 mg q 2 weeks.  Will need CBC, lTs and repeat testosterone level  around 6 weeks from now halfway between shots. Marland Kitchen

## 2015-07-03 NOTE — Assessment & Plan Note (Addendum)
Found in the setting of rapid wt loss of 25 lbs,  By CT scan, Considered to be due to rapid weight loss per GI

## 2015-07-04 ENCOUNTER — Telehealth: Payer: Self-pay

## 2015-07-04 NOTE — Telephone Encounter (Signed)
PA for Testosterone started on Cover my meds.

## 2015-07-06 ENCOUNTER — Telehealth: Payer: Self-pay | Admitting: Internal Medicine

## 2015-07-06 ENCOUNTER — Encounter: Payer: Self-pay | Admitting: Internal Medicine

## 2015-07-06 NOTE — Telephone Encounter (Signed)
Juliann Pulse completing the appeal process. Thanks

## 2015-07-06 NOTE — Telephone Encounter (Signed)
PA was denied due to not prescribed for a plan covered rationale.  Please advise, Appeal?

## 2015-07-06 NOTE — Telephone Encounter (Signed)
thanks

## 2015-07-06 NOTE — Telephone Encounter (Signed)
Yes; appeal

## 2015-07-06 NOTE — Telephone Encounter (Signed)
Seth Doyle is handling the appeal for [name]'s testosterone

## 2015-07-06 NOTE — Telephone Encounter (Signed)
Mailed appeal information to [facility] , with written signed letter from MD and updated notes with new DX.

## 2015-07-07 ENCOUNTER — Encounter: Payer: Self-pay | Admitting: Internal Medicine

## 2015-07-08 ENCOUNTER — Encounter: Payer: Self-pay | Admitting: Internal Medicine

## 2015-07-13 MED ORDER — TESTOSTERONE CYPIONATE 200 MG/ML IM SOLN: 100.0000 mg | INTRAMUSCULAR | Status: DC

## 2015-07-13 NOTE — Telephone Encounter (Signed)
Please send new rx for testosteron for pharmacy

## 2015-07-15 ENCOUNTER — Emergency Department: Payer: BC Managed Care – PPO

## 2015-07-15 ENCOUNTER — Encounter: Payer: Self-pay | Admitting: Emergency Medicine

## 2015-07-15 ENCOUNTER — Emergency Department
Admission: EM | Admit: 2015-07-15 | Discharge: 2015-07-15 | Disposition: A | Discharge status: Home / Self Care | Payer: BC Managed Care – PPO | Attending: Emergency Medicine | Admitting: Emergency Medicine

## 2015-07-15 DIAGNOSIS — K1121 Acute sialoadenitis: Secondary | ICD-10-CM | POA: Insufficient documentation

## 2015-07-15 DIAGNOSIS — I10 Essential (primary) hypertension: Secondary | ICD-10-CM | POA: Insufficient documentation

## 2015-07-15 DIAGNOSIS — Z79899 Other long term (current) drug therapy: Secondary | ICD-10-CM | POA: Diagnosis not present

## 2015-07-15 DIAGNOSIS — R220 Localized swelling, mass and lump, head: Secondary | ICD-10-CM | POA: Diagnosis present

## 2015-07-15 LAB — CBC WITH DIFFERENTIAL/PLATELET
Basophils Absolute: 0 10*3/uL (ref 0–0.1)
Basophils Relative: 0 %
Eosinophils Absolute: 0.1 10*3/uL (ref 0–0.7)
Eosinophils Relative: 1 %
HCT: 49.5 % (ref 40.0–52.0)
HEMOGLOBIN: 17.1 g/dL (ref 13.0–18.0)
LYMPHS ABS: 1.6 10*3/uL (ref 1.0–3.6)
LYMPHS PCT: 20 %
MCH: 30.7 pg (ref 26.0–34.0)
MCHC: 34.6 g/dL (ref 32.0–36.0)
MCV: 88.6 fL (ref 80.0–100.0)
Monocytes Absolute: 0.8 10*3/uL (ref 0.2–1.0)
Monocytes Relative: 10 %
NEUTROS PCT: 69 %
Neutro Abs: 5.6 10*3/uL (ref 1.4–6.5)
Platelets: 200 10*3/uL (ref 150–440)
RBC: 5.59 MIL/uL (ref 4.40–5.90)
RDW: 12.5 % (ref 11.5–14.5)
WBC: 8.1 10*3/uL (ref 3.8–10.6)

## 2015-07-15 LAB — BASIC METABOLIC PANEL
ANION GAP: 6 (ref 5–15)
BUN: 16 mg/dL (ref 6–20)
CHLORIDE: 103 mmol/L (ref 101–111)
CO2: 29 mmol/L (ref 22–32)
Calcium: 10 mg/dL (ref 8.9–10.3)
Creatinine, Ser: 1.2 mg/dL (ref 0.61–1.24)
GFR calc non Af Amer: >60 mL/min (ref >60)
Glucose, Bld: 106 mg/dL — ABNORMAL HIGH (ref 65–99)
POTASSIUM: 3.9 mmol/L (ref 3.5–5.1)
SODIUM: 138 mmol/L (ref 135–145)

## 2015-07-15 MED ORDER — IOHEXOL 300 MG/ML  SOLN
75.0000 mL | Freq: Once | INTRAMUSCULAR | Status: AC | PRN
  Administered 2015-07-15: 75 mL via INTRAVENOUS

## 2015-07-15 MED ORDER — CLINDAMYCIN HCL 150 MG PO CAPS
ORAL_CAPSULE | ORAL | Status: AC
  Filled 2015-07-15: qty 2

## 2015-07-15 MED ORDER — KETOROLAC TROMETHAMINE 30 MG/ML IJ SOLN
30.0000 mg | Freq: Once | INTRAMUSCULAR | Status: AC
  Administered 2015-07-15: 30 mg via INTRAVENOUS
  Filled 2015-07-15: qty 1

## 2015-07-15 MED ORDER — OXYCODONE-ACETAMINOPHEN 5-325 MG PO TABS: 1.0000 | ORAL_TABLET | Freq: Four times a day (QID) | ORAL | Status: DC | PRN

## 2015-07-15 MED ORDER — CLINDAMYCIN HCL 150 MG PO CAPS: 450.0000 mg | ORAL_CAPSULE | Freq: Three times a day (TID) | ORAL | Status: DC

## 2015-07-15 MED ORDER — SODIUM CHLORIDE 0.9 % IV BOLUS (SEPSIS)
1000.0000 mL | Freq: Once | INTRAVENOUS | Status: AC
  Administered 2015-07-15: 1000 mL via INTRAVENOUS

## 2015-07-15 MED ORDER — CLINDAMYCIN HCL 150 MG PO CAPS
450.0000 mg | ORAL_CAPSULE | Freq: Once | ORAL | Status: AC
  Administered 2015-07-15: 450 mg via ORAL
  Filled 2015-07-15: qty 3

## 2015-07-15 MED ORDER — OXYCODONE-ACETAMINOPHEN 5-325 MG PO TABS
1.0000 | ORAL_TABLET | Freq: Once | ORAL | Status: AC
  Administered 2015-07-15: 1 via ORAL
  Filled 2015-07-15: qty 1

## 2015-07-15 MED ORDER — DEXAMETHASONE SODIUM PHOSPHATE 10 MG/ML IJ SOLN
10.0000 mg | Freq: Once | INTRAMUSCULAR | Status: AC
  Administered 2015-07-15: 10 mg via INTRAVENOUS
  Filled 2015-07-15: qty 1

## 2015-07-15 NOTE — ED Notes (Signed)
  Reviewed d/c instructions, follow-up care, and prescriptions with pt. Pt verbalized understanding 

## 2015-07-15 NOTE — Discharge Instructions (Signed)
You were prescribed a medication that is potentially sedating. Do not drink alcohol, drive or participate in any other potentially dangerous activities while taking this medication as it may make you sleepy. Do not take this medication with any other sedating medications, either prescription or over-the-counter. If you were prescribed Percocet or Vicodin, do not take these with acetaminophen (Tylenol) as it is already contained within these medications. °  °Opioid pain medications (or "narcotics") can be habit forming.  Use it as little as possible to achieve adequate pain control.  Do not use or use it with extreme caution if you have a history of opiate abuse or dependence.  If you are on a pain contract with your primary care doctor or a pain specialist, be sure to let them know you were prescribed this medication today from the [facility].  This medication is intended for your use only - do not give any to anyone else and keep it in a secure place where nobody else, especially children and pets, have access to it.  It will also cause or worsen constipation, so you may want to consider taking an over-the-counter stool softener while you are taking this medication. ° °Salivary Gland Infection °A salivary gland infection is an infection in one or more of the glands that produce spit (saliva). You have six major salivary glands. Each gland has a duct that carries saliva into your mouth. Saliva keeps your mouth moist and breaks down the food that you eat. It also helps to prevent tooth decay. °Two salivary glands are located just in front of your ears (parotid). The ducts for these glands open up inside your cheeks, near your back teeth. You also have two glands under your tongue (sublingual) and two glands under your jaw (submandibular). The ducts for these glands open under your tongue. Any salivary gland can become infected. Most infections occur in the parotid glands or submandibular  glands. °CAUSES °Salivary glands can be infected by viruses or bacteria. °· The mumps virus is the most common cause of viral salivary gland infections, though mumps is now rare in many areas because of vaccination. °¨ This infection causes swelling in both parotid glands. °¨ Viral infections are more common in children. °· The bacteria that cause salivary gland infections are usually the same bacteria that normally live in your mouth. °¨ A stone can form in a salivary gland and block the flow of saliva. As a result, saliva backs up into the salivary gland. Bacteria may then start to grow behind the blockage and cause infection. °¨ Bacterial infections usually cause pain and swelling on one side of the face. Submandibular gland swelling occurs under the jaw. Parotid swelling occurs in front of the ear. °¨ Bacterial infections are more common in adults. °RISK FACTORS °Children who do not get the MMR (measles, mumps, rubella) vaccine are more likely to get mumps, which can cause a viral salivary gland infection. °Risk factors for bacterial infections include: °· Poor dental care (oral hygiene). °· Smoking. °· Not drinking enough water. °· Having a disease that causes dry mouth and dry eyes (Mikulicz syndrome or Sjogren syndrome). °SIGNS AND SYMPTOMS °The main sign of salivary gland infection is a swollen salivary gland. This type of inflammation is often called sialadenitis. You may have swelling in front of your ear, under your jaw, or under your tongue. Swelling may get worse when you eat and decrease after you eat. Other signs and symptoms include: °· Pain. °· Tenderness. °·   Redness.  Dry mouth.  Bad taste in your mouth.  Difficulty chewing and swallowing.  Fever. DIAGNOSIS Your health care provider may suspect a salivary gland infection based on your signs and symptoms. He or she will also do a physical exam. The health care provider will look and feel inside your mouth to see whether a stone is blocking  a salivary gland duct. You may need to see an ear, nose, and throat specialist (ENT or otolaryngologist) for diagnosis and treatment. You may also need to have diagnostic tests, such as:  An X-ray to check for a stone.  Other imaging studies to look for an abscess and to rule out other causes of swelling. These tests may include:  Ultrasound.  CT scan.  MRI.  Culture and sensitivity test. This involves collecting a sample of pus for testing in the lab to see what bacteria grow and what antibiotics they are sensitive to. The testing sample may be:  Swabbed from a salivary gland duct.  Withdrawn from a swollen gland with a needle (aspiration). TREATMENT Viral salivary gland infections usually clear up without treatment. Bacterial infections are usually treated with antibiotic medicine. Severe infections that cause difficulty with swallowing may be treated with an IV antibiotic in the hospital. Other treatments may include:  Probing and widening the salivary duct to allow a stone to pass. In some cases, a thin, flexible scope (endoscope) may be inserted into the duct to find a stone and remove it.  Breaking up a stone using sound waves.  Draining an infected gland (abscess) with a needle.  In some cases, you may need surgery so your health care provider can:  Remove a stone.  Drain pus from an abscess.  Remove a badly infected gland. HOME CARE INSTRUCTIONS  Take medicines only as directed by your health care provider.  If you were prescribed an antibiotic medicine, finish it all even if you start to feel better.  Follow these instructions every few hours:  Suck on a lemon candy to stimulate the flow of saliva.  Put a warm compress over the gland.  Gently massage the gland.  Drink enough fluid to keep your urine clear or pale yellow.  Rinse your mouth with a mixture of warm water and salt every few hours. To make this mixture, add a pinch of salt to 1 cup of warm  water.  Practice good oral hygiene by brushing and flossing your teeth after meals and before you go to bed.  Do not use any tobacco products, including cigarettes, chewing tobacco, or electronic cigarettes. If you need help quitting, ask your health care provider. SEEK MEDICAL CARE IF:  You have pain and swelling in your face, jaw, or mouth after eating.  You have persistent swelling in any of these places:  In front of your ear.  Under your jaw.  Inside your mouth. SEEK IMMEDIATE MEDICAL CARE IF:   You have pain and swelling in your face, jaw, or mouth that are getting worse.  Your pain and swelling make it hard to swallow or breathe.   This information is not intended to replace advice given to you by your health care provider. Make sure you discuss any questions you have with your health care provider.   Document Released: 06/14/2004 Document Revised: 05/28/2014 Document Reviewed: 10/07/2013 Elsevier Interactive Patient Education [employer].

## 2015-07-15 NOTE — ED Notes (Signed)
Patient report waking up this morning around 2:30am with facial pain and also swelling in his neck. Patient states that through out the day the swelling has gotten worse, patient now has swelling around his mouth and tongue. Swelling and redness also noted on patients chest. Patient denies shortness of breath, patient does note some difficulty swallowing. Patient in no acute distress at this time.

## 2015-07-15 NOTE — ED Notes (Signed)
Patient states that he went to sleep at 2100 last night and woke up at 0100 with a significant mass/swelling to the side of his neck. Patient c/o right sided facial pain, tongue swelling, dizziness. Patient able to maintain airway and secretions at this time.

## 2015-07-15 NOTE — ED Provider Notes (Signed)
[facility] Provider Note  ____________________________________________  Time seen: 3:15 PM  I have reviewed the triage vital signs and the nursing notes.   HISTORY  Chief Complaint Mass; Oral Swelling; Facial Swelling; and Facial Pain    HPI Seth Doyle is a 60 y.o. male who woke up this morning at about 2:00 AM and noticed that he had pain and swelling on the right neck. When he went to bed last night he was in his usual state of health without any symptoms and completely normal. This is never happened to him before. He denies any cough or shortness of breath. He is able to swallow and eat and drink although it does hurt when he swallows. No recent dental injuries or intraoral injuries.   No neck pain or stiffness. No pain with movement or flexion or extension of the neck.  Past Medical History  Diagnosis Date  . Diverticulitis   . Diverticulitis of sigmoid colon 02/04/2011  . Reflux esophagitis 09/10/2012  . Constipation 02/22/2015  . Hepatic steatosis 02/22/2015  . Subacromial bursitis 09/18/2013  . Left rotator cuff tear 10/30/2013  . 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 Urinary retention due to benign prostatic hyperplasia 04/27/2014  . Chronic insomnia 03/26/2014    Managed with generic ambien.     . Essential hypertension 04/27/2014    related to event. no longer on medicines.  . Vertigo     3 episodes.  last one approx 6 mos ago  . Wears contact lenses      Patient Active Problem List   Diagnosis Date Noted  . Primary hypogonadism in male 07/03/2015  . Personal history of colonic polyps   . Intestinal bypass or anastomosis status   . Benign neoplasm of transverse colon   . Constipation 02/22/2015  . Hepatic steatosis 02/22/2015  . 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 Vitamin D deficiency 05/02/2014  . Adjustment disorder with mixed anxiety and depressed mood 04/27/2014  . Urinary retention due to  benign prostatic hyperplasia 04/27/2014  . Essential hypertension 04/27/2014  . Encounter for preventive health examination 04/27/2014  . Chronic insomnia 03/26/2014  . Left rotator cuff tear 10/30/2013  . Subacromial bursitis 09/18/2013  . 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 Reflux esophagitis 09/10/2012  . Diverticulitis of sigmoid colon 02/04/2011     Past Surgical History  Procedure Laterality Date  . Rhinoplasty  1992    deviated septum due to volleyball  . Rotator cuff repair  1991    Dr. Marry Guan  . Shoulder arthroscopy w/ rotator cuff repair  1991    [facility]  . Rhinoplasty  1992    foe deviated septm, traumatic  [facility]   . Colon surgery  August 2012    sigmoid colectomy, diverticulitis  . Eye surgery      tear duct repair   . Colonoscopy with propofol N/A 05/06/2015    Procedure: COLONOSCOPY WITH PROPOFOL;  Surgeon: Lucilla Lame, MD;  Location: [place] [facility];  Service: Endoscopy;  Laterality: N/A;  . Polypectomy  05/06/2015    Procedure: POLYPECTOMY;  Surgeon: Lucilla Lame, MD;  Location: [place];  Service: Endoscopy;;     Current Outpatient Rx  Name  Route  Sig  Dispense  Refill  . aspirin-acetaminophen-caffeine (EXCEDRIN MIGRAINE) 250-250-65 MG tablet   Oral   Take 2 tablets by mouth every 6 (six) hours as needed for headache.         . B Complex-C (B-COMPLEX WITH  VITAMIN C) tablet   Oral   Take 1 tablet by mouth daily.         . famotidine (PEPCID) 40 MG tablet   Oral   Take 40 mg by mouth daily as needed for heartburn or indigestion.         . magnesium oxide (MAG-OX) 400 (241.3 Mg) MG tablet   Oral   Take 400 mg by mouth daily.         . Multiple Vitamin (MULTIVITAMIN WITH MINERALS) TABS tablet   Oral   Take 1 tablet by mouth daily.         . tamsulosin (FLOMAX) 0.4 MG CAPS capsule   Oral   Take 1 capsule (0.4 mg total) by mouth daily.   30 capsule   3   . testosterone cypionate (DEPOTESTOTERONE CYPIONATE)  100 MG/ML injection   Intramuscular   Inject 100 mg into the muscle every 14 (fourteen) days. For IM use only         . zolpidem (AMBIEN) 10 MG tablet   Oral   Take 10 mg by mouth at bedtime.         . clindamycin (CLEOCIN) 150 MG capsule   Oral   Take 3 capsules (450 mg total) by mouth 3 (three) times daily.   90 capsule   0   . oxyCODONE-acetaminophen (ROXICET) 5-325 MG tablet   Oral   Take 1 tablet by mouth every 6 (six) hours as needed for severe pain.   6 tablet   0      Allergies Wellbutrin   Family History  Problem Relation Age of Onset  . Diabetes Mother   . Mental illness Mother     Dementia  . COPD Father   . Hyperlipidemia Father   . Mental illness Father     Parkinson's Disease    Social History Social History  Substance Use Topics  . Smoking status: Never Smoker   . Smokeless tobacco: Never Used  . Alcohol Use: Yes     Comment: rare - once /year    Review of Systems  Constitutional:   No fever or chills. No weight changes Eyes:   No blurry vision or double vision.  ENT:   No sore throat. Right neck swelling as above Cardiovascular:   No chest pain. Respiratory:   No dyspnea or cough. Gastrointestinal:   Negative for abdominal pain, vomiting and diarrhea.  No BRBPR or melena. Genitourinary:   Negative for dysuria or difficulty urinating. Musculoskeletal:   Negative for back pain. No joint swelling or pain. Skin:   Negative for rash. Neurological:   Negative for headaches, focal weakness or numbness. Psychiatric:  No anxiety or depression.   Endocrine:  No changes in energy or sleep difficulty.  10-point ROS otherwise negative.  ____________________________________________   PHYSICAL EXAM:  VITAL SIGNS: ED Triage Vitals  Enc Vitals Group     BP 07/15/15 1423 157/79 mmHg     Pulse Rate 07/15/15 1423 65     Resp 07/15/15 1423 18     Temp 07/15/15 1423 98.1 F (36.7 C)     Temp Source 07/15/15 1423 Oral     SpO2 07/15/15 1423  97 %     Weight 07/15/15 1423 195 lb (88.451 kg)     Height 07/15/15 1423 5\' 11"  (1.803 m)     Head Cir --      Peak Flow --      Pain Score 07/15/15 1425 9  Pain Loc --      Pain Edu? --      Excl. in [place]? --     Vital signs reviewed, nursing assessments reviewed.   Constitutional:   Alert and oriented. Well appearing and in no distress. Eyes:   No scleral icterus. No conjunctival pallor. PERRL. EOMI ENT   Head:   Normocephalic and atraumatic.   Nose:   No congestion/rhinnorhea. No septal hematoma   Mouth/Throat:   MMM, no pharyngeal erythema. No peritonsillar mass. No dental fracture or injuries. No drainage or swelling or fluctuance around the gums. Floor of mouth is soft and without edema or palatal elevation.   Neck:   No stridor. No SubQ emphysema. No meningismus. Large area of swelling and tenderness in the right submandibular area, approximately 4 cm. No submental swelling Hematological/Lymphatic/Immunilogical:   No cervical lymphadenopathy. Cardiovascular:   RRR. Symmetric bilateral radial and DP pulses.  No murmurs.  Respiratory:   Normal respiratory effort without tachypnea nor retractions. Breath sounds are clear and equal bilaterally. No wheezes/rales/rhonchi. Gastrointestinal:   Soft and nontender. Non distended. There is no CVA tenderness.  No rebound, rigidity, or guarding. Genitourinary:   deferred Musculoskeletal:   Nontender with normal range of motion in all extremities. No joint effusions.  No lower extremity tenderness.  No edema. No midline spinal tenderness Neurologic:   Normal speech and language.  CN 2-10 normal. Motor grossly intact. No gross focal neurologic deficits are appreciated.  Skin:    Skin is warm, dry and intact. No rash noted.  No petechiae, purpura, or bullae. Psychiatric:   Mood and affect are normal. ____________________________________________    LABS (pertinent positives/negatives) (all labs ordered are listed, but only  abnormal results are displayed) Labs Reviewed  BASIC METABOLIC PANEL - Abnormal; Notable for the following:    Glucose, Bld 106 (*)    All other components within normal limits  CBC WITH DIFFERENTIAL/PLATELET   ____________________________________________   EKG    ____________________________________________    RADIOLOGY  CT neck shows right sialoadenitis. No evidence of obstructing stone in the salivary duct.  ____________________________________________   PROCEDURES   ____________________________________________   INITIAL IMPRESSION / ASSESSMENT AND PLAN / ED COURSE  Pertinent labs & imaging results that were available during my care of the patient were reviewed by me and considered in my medical decision making (see chart for details).  Patient presents with inflamed mass in the right submandibular area. This is found to be sialoadenitis on CT. Clindamycin and sialagogues and follow up with ENT. We'll also get a Percocet now and then a brief prescription for him to take on top of NSAIDs at home. No evidence of any airway involvement or compromise. He is calm and comfortable and medically stable.     ____________________________________________   FINAL CLINICAL IMPRESSION(S) / ED DIAGNOSES  Final diagnoses:  Acute sialoadenitis      Carrie Mew, MD 07/15/15 [facility]

## 2015-08-02 ENCOUNTER — Other Ambulatory Visit: Payer: Self-pay

## 2015-08-02 NOTE — Telephone Encounter (Signed)
Pt is requesting a refill on Ambien 10 mg tablet. Pt last OV 07/01/15, last filled 07/04/15. Please advise, thanks

## 2015-08-03 ENCOUNTER — Encounter: Payer: Self-pay | Admitting: Internal Medicine

## 2015-08-03 MED ORDER — ZOLPIDEM TARTRATE 10 MG PO TABS: 10.0000 mg | ORAL_TABLET | Freq: Every day | ORAL | Status: DC

## 2015-08-03 NOTE — Telephone Encounter (Signed)
REFILLED

## 2015-08-25 NOTE — Telephone Encounter (Signed)
[employer] faxed request for labs for the appeal process faxed to [facility].

## 2015-08-29 ENCOUNTER — Other Ambulatory Visit (INDEPENDENT_AMBULATORY_CARE_PROVIDER_SITE_OTHER): Payer: BC Managed Care – PPO

## 2015-08-29 ENCOUNTER — Telehealth: Payer: Self-pay | Admitting: *Deleted

## 2015-08-29 DIAGNOSIS — E291 Testicular hypofunction: Secondary | ICD-10-CM

## 2015-08-29 DIAGNOSIS — Z79899 Other long term (current) drug therapy: Secondary | ICD-10-CM

## 2015-08-29 DIAGNOSIS — R197 Diarrhea, unspecified: Secondary | ICD-10-CM | POA: Diagnosis not present

## 2015-08-29 LAB — LIPID PANEL
CHOL/HDL RATIO: 3
Cholesterol: 148 mg/dL (ref 0–200)
HDL: 42.6 mg/dL (ref >39.00)
LDL CALC: 87 mg/dL (ref 0–99)
NONHDL: 105.19
Triglycerides: 89 mg/dL (ref 0.0–149.0)
VLDL: 17.8 mg/dL (ref 0.0–40.0)

## 2015-08-29 LAB — COMPREHENSIVE METABOLIC PANEL
ALT: 24 U/L (ref 0–53)
AST: 31 U/L (ref 0–37)
Albumin: 4.3 g/dL (ref 3.5–5.2)
Alkaline Phosphatase: 61 U/L (ref 39–117)
BILIRUBIN TOTAL: 0.8 mg/dL (ref 0.2–1.2)
BUN: 16 mg/dL (ref 6–23)
CO2: 28 meq/L (ref 19–32)
CREATININE: 1.21 mg/dL (ref 0.40–1.50)
Calcium: 9.7 mg/dL (ref 8.4–10.5)
Chloride: 100 mEq/L (ref 96–112)
GFR: 65.45 mL/min (ref >60.00)
GLUCOSE: 136 mg/dL — AB (ref 70–99)
Potassium: 3.9 mEq/L (ref 3.5–5.1)
Sodium: 137 mEq/L (ref 135–145)
Total Protein: 6.8 g/dL (ref 6.0–8.3)

## 2015-08-29 LAB — CBC WITH DIFFERENTIAL/PLATELET
BASOS ABS: 0 10*3/uL (ref 0.0–0.1)
Basophils Relative: 0.3 % (ref 0.0–3.0)
EOS ABS: 0.1 10*3/uL (ref 0.0–0.7)
Eosinophils Relative: 1.3 % (ref 0.0–5.0)
HCT: 44.3 % (ref 39.0–52.0)
Hemoglobin: 15 g/dL (ref 13.0–17.0)
LYMPHS ABS: 1.6 10*3/uL (ref 0.7–4.0)
Lymphocytes Relative: 24.6 % (ref 12.0–46.0)
MCHC: 33.9 g/dL (ref 30.0–36.0)
MCV: 89.9 fl (ref 78.0–100.0)
MONO ABS: 0.7 10*3/uL (ref 0.1–1.0)
Monocytes Relative: 10.7 % (ref 3.0–12.0)
NEUTROS ABS: 4.1 10*3/uL (ref 1.4–7.7)
NEUTROS PCT: 63.1 % (ref 43.0–77.0)
PLATELETS: 166 10*3/uL (ref 150.0–400.0)
RBC: 4.92 Mil/uL (ref 4.22–5.81)
RDW: 13.7 % (ref 11.5–15.5)
WBC: 6.4 10*3/uL (ref 4.0–10.5)

## 2015-08-29 NOTE — Telephone Encounter (Signed)
Testosterone level is the most important , but all desired labs ordered

## 2015-08-29 NOTE — Telephone Encounter (Signed)
Labs and dx?  

## 2015-08-31 ENCOUNTER — Other Ambulatory Visit: Payer: Self-pay | Admitting: Internal Medicine

## 2015-08-31 ENCOUNTER — Encounter: Payer: Self-pay | Admitting: Internal Medicine

## 2015-08-31 DIAGNOSIS — R7301 Impaired fasting glucose: Secondary | ICD-10-CM | POA: Insufficient documentation

## 2015-08-31 LAB — TESTOSTERONE,FREE AND TOTAL
TESTOSTERONE FREE: 10.6 pg/mL (ref 7.2–24.0)
Testosterone: 385 ng/dL (ref 348–1197)

## 2015-09-01 ENCOUNTER — Encounter: Payer: Self-pay | Admitting: Internal Medicine

## 2015-09-01 DIAGNOSIS — Z79899 Other long term (current) drug therapy: Secondary | ICD-10-CM

## 2015-09-01 DIAGNOSIS — R7989 Other specified abnormal findings of blood chemistry: Secondary | ICD-10-CM

## 2015-09-04 MED ORDER — TESTOSTERONE CYPIONATE 100 MG/ML IM SOLN
125.0000 mg | INTRAMUSCULAR | Status: DC
Start: 2015-09-04 — End: 2016-07-04

## 2015-09-04 NOTE — Addendum Note (Signed)
Addended by: Crecencio Mc on: 09/04/2015 10:50 PM   Modules accepted: Orders

## 2015-11-07 ENCOUNTER — Encounter: Payer: Self-pay | Admitting: Internal Medicine

## 2015-11-08 ENCOUNTER — Other Ambulatory Visit: Payer: Self-pay | Admitting: Internal Medicine

## 2015-11-08 MED ORDER — CYCLOBENZAPRINE HCL 10 MG PO TABS: 10.0000 mg | ORAL_TABLET | Freq: Three times a day (TID) | ORAL | Status: DC | PRN

## 2015-12-27 ENCOUNTER — Other Ambulatory Visit: Payer: Self-pay

## 2015-12-27 NOTE — Telephone Encounter (Signed)
Received fax from [facility] requesting refill for Testosterone injections. Last fill was on 09/04/15 for the higher dose 1.25 ml with 5 refills due to testosterone level. Called pharmacy and they state there are refills left so I am not sure why they were faxing it again. Advised patient of the above, he states his wife is the one that wanted refill on the medication because they are leaving on vacation on August 18th for about 9 days. I advised patient that they just need to contact pharmacy and get a refill because they have refills on file per pharmacist. I also advised patient that he is over due for labs and made appointment to get fasting labs done on august 18th before they leave town. He will call us back if need to change this date.-Markella Dao, RMA

## 2016-01-02 ENCOUNTER — Telehealth: Payer: Self-pay | Admitting: Internal Medicine

## 2016-01-02 NOTE — Telephone Encounter (Signed)
Seth Doyle returned [name]'s call. He said at times his signal isn't strong in the building he's in. If he sees her calling again he'll answer but if his phone doesn't ring, he'd like [name] to leave him a detailed message if possible.   Pt's ph# [phone] Thank you.

## 2016-01-02 NOTE — Telephone Encounter (Signed)
You have a viral  Syndrome .  The post nasal drip is causing your sore throat.  Lavage your sinuses twice daily with Simply saline nasal spray.  Use benadryl 25 mg every 8 hours for the post nasal drip and Afrin nasal spray every 12 hours  as needed for the congestion.  Gargle with salt water as needed for the sore throat.  Delsym is available OTC as a cough syrup  If the throat is no better  In 3 to 4 days OR  if you develop T > 100.4,  Green nasal discharge,  Or facial pain,  Call for an antibiotic.

## 2016-01-02 NOTE — Telephone Encounter (Signed)
Spoke with patient, states as noted above symptoms started yesterday, cold symptoms. No chest congestion, cough, no fever or shortness of breath. Right now everything in the upper area-head congestion, runny nose. He is leaving Saturday this week for a cruise. He has taking NyQuil, Sudafed. Advised patient will check with the doctor and let him know-Seth Doyle, RMA

## 2016-01-02 NOTE — Telephone Encounter (Signed)
[id number] 

## 2016-01-02 NOTE — Telephone Encounter (Signed)
The patient has had cold symptoms since yesterday. He is having sniffles, congestion and sneezing he does not want this to go into an URI. He is leaving for a cruise and did not want to be sink on the ship. Can the physician call something into the pharmacy?

## 2016-01-03 NOTE — Telephone Encounter (Signed)
Called patient back and left detailed message on his voicemail as per patient's permission-aa

## 2016-01-06 ENCOUNTER — Other Ambulatory Visit (INDEPENDENT_AMBULATORY_CARE_PROVIDER_SITE_OTHER): Payer: BC Managed Care – PPO

## 2016-01-06 DIAGNOSIS — R7301 Impaired fasting glucose: Secondary | ICD-10-CM | POA: Diagnosis not present

## 2016-01-06 DIAGNOSIS — Z79899 Other long term (current) drug therapy: Secondary | ICD-10-CM

## 2016-01-06 DIAGNOSIS — R7989 Other specified abnormal findings of blood chemistry: Secondary | ICD-10-CM

## 2016-01-06 LAB — COMPREHENSIVE METABOLIC PANEL
ALBUMIN: 4 g/dL (ref 3.5–5.2)
ALK PHOS: 51 U/L (ref 39–117)
ALT: 32 U/L (ref 0–53)
AST: 38 U/L — AB (ref 0–37)
BUN: 12 mg/dL (ref 6–23)
CO2: 28 mEq/L (ref 19–32)
CREATININE: 1.25 mg/dL (ref 0.40–1.50)
Calcium: 9 mg/dL (ref 8.4–10.5)
Chloride: 104 mEq/L (ref 96–112)
GFR: 62.96 mL/min (ref >60.00)
GLUCOSE: 99 mg/dL (ref 70–99)
POTASSIUM: 3.6 meq/L (ref 3.5–5.1)
SODIUM: 138 meq/L (ref 135–145)
TOTAL PROTEIN: 6.7 g/dL (ref 6.0–8.3)
Total Bilirubin: 0.8 mg/dL (ref 0.2–1.2)

## 2016-01-06 LAB — LIPID PANEL
CHOLESTEROL: 109 mg/dL (ref 0–200)
HDL: 31.3 mg/dL — ABNORMAL LOW (ref >39.00)
LDL CALC: 63 mg/dL (ref 0–99)
NONHDL: 77.81
Total CHOL/HDL Ratio: 3
Triglycerides: 74 mg/dL (ref 0.0–149.0)
VLDL: 14.8 mg/dL (ref 0.0–40.0)

## 2016-01-06 LAB — CBC WITH DIFFERENTIAL/PLATELET
BASOS PCT: 0.4 % (ref 0.0–3.0)
Basophils Absolute: 0 10*3/uL (ref 0.0–0.1)
EOS ABS: 0.1 10*3/uL (ref 0.0–0.7)
EOS PCT: 2.1 % (ref 0.0–5.0)
HEMATOCRIT: 44.4 % (ref 39.0–52.0)
HEMOGLOBIN: 15 g/dL (ref 13.0–17.0)
LYMPHS PCT: 19.5 % (ref 12.0–46.0)
Lymphs Abs: 1.3 10*3/uL (ref 0.7–4.0)
MCHC: 33.8 g/dL (ref 30.0–36.0)
MCV: 89 fl (ref 78.0–100.0)
MONO ABS: 0.8 10*3/uL (ref 0.1–1.0)
Monocytes Relative: 11.6 % (ref 3.0–12.0)
Neutro Abs: 4.3 10*3/uL (ref 1.4–7.7)
Neutrophils Relative %: 66.4 % (ref 43.0–77.0)
Platelets: 192 10*3/uL (ref 150.0–400.0)
RBC: 4.99 Mil/uL (ref 4.22–5.81)
RDW: 13.6 % (ref 11.5–15.5)
WBC: 6.5 10*3/uL (ref 4.0–10.5)

## 2016-01-06 LAB — HEMOGLOBIN A1C: HEMOGLOBIN A1C: 5.7 % (ref 4.6–6.5)

## 2016-01-06 NOTE — Addendum Note (Signed)
Addended by: Frutoso Chase A on: 01/06/2016 10:19 AM   Modules accepted: Orders

## 2016-01-08 ENCOUNTER — Encounter: Payer: Self-pay | Admitting: Internal Medicine

## 2016-01-17 LAB — TESTOS,TOTAL,FREE AND SHBG (FEMALE)
Sex Hormone Binding Glob.: 23 nmol/L (ref 22–77)
Testosterone, Free: 79.4 pg/mL (ref 35.0–155.0)
Testosterone,Total,LC/MS/MS: 446 ng/dL (ref 250–1100)

## 2016-01-18 ENCOUNTER — Encounter: Payer: Self-pay | Admitting: Internal Medicine

## 2016-02-03 ENCOUNTER — Other Ambulatory Visit: Payer: Self-pay | Admitting: Internal Medicine

## 2016-02-06 ENCOUNTER — Encounter: Payer: Self-pay | Admitting: *Deleted

## 2016-02-06 NOTE — Telephone Encounter (Signed)
My Chart message sent

## 2016-02-06 NOTE — Telephone Encounter (Signed)
Refill for 30 days only.  OFFICE VISIT NEEDED prior to any more refills 

## 2016-02-06 NOTE — Telephone Encounter (Signed)
Last visit 2/17 last labs 8/17 ok to fill Ambien?

## 2016-02-28 ENCOUNTER — Encounter: Payer: Self-pay | Admitting: Internal Medicine

## 2016-02-28 ENCOUNTER — Ambulatory Visit (INDEPENDENT_AMBULATORY_CARE_PROVIDER_SITE_OTHER): Payer: BC Managed Care – PPO | Admitting: Internal Medicine

## 2016-02-28 VITALS — BP 178/120 | HR 93 | Temp 98.4°F | Resp 12 | Ht 67.0 in | Wt 214.0 lb

## 2016-02-28 DIAGNOSIS — K760 Fatty (change of) liver, not elsewhere classified: Secondary | ICD-10-CM | POA: Diagnosis not present

## 2016-02-28 DIAGNOSIS — E291 Testicular hypofunction: Secondary | ICD-10-CM | POA: Diagnosis not present

## 2016-02-28 DIAGNOSIS — I10 Essential (primary) hypertension: Secondary | ICD-10-CM | POA: Insufficient documentation

## 2016-02-28 DIAGNOSIS — R030 Elevated blood-pressure reading, without diagnosis of hypertension: Secondary | ICD-10-CM | POA: Diagnosis not present

## 2016-02-28 DIAGNOSIS — Z79899 Other long term (current) drug therapy: Secondary | ICD-10-CM | POA: Diagnosis not present

## 2016-02-28 MED ORDER — AMLODIPINE BESYLATE 2.5 MG PO TABS: 2.5000 mg | ORAL_TABLET | Freq: Every day | ORAL | 3 refills | Status: DC

## 2016-02-28 NOTE — Progress Notes (Signed)
Pre-visit discussion using our clinic review tool. No additional management support is needed unless otherwise documented below in the visit note.  

## 2016-02-28 NOTE — Progress Notes (Signed)
Subjective:  Patient ID: Seth Doyle, male    DOB: 1955-07-17  Age: 60 y.o. MRN: [id number]  CC: The primary encounter diagnosis was Primary hypogonadism in male. Diagnoses of Elevated blood-pressure reading without diagnosis of hypertension, Long-term use of high-risk medication, and Hepatic steatosis were also pertinent to this visit.  HPI Seth Doyle presents for  Follow up on hypogonadism managed with testosterone supplementation which was started in February.  Patient states that he has noticed some improvement in his  Performance at the gym,  But otherwise there has been no quality of life improvement.  He denies and adverse effects except weight gain, and  his BP is noted to be quite elevated.  He has not used andy NSAIDS in over 48 hours.  Last testosterone injection was 10 days ago.   Outpatient Medications Prior to Visit  Medication Sig Dispense Refill  . aspirin-acetaminophen-caffeine (EXCEDRIN MIGRAINE) 250-250-65 MG tablet Take 2 tablets by mouth every 6 (six) hours as needed for headache.    . B Complex-C (B-COMPLEX WITH VITAMIN C) tablet Take 1 tablet by mouth daily.    . famotidine (PEPCID) 40 MG tablet Take 40 mg by mouth daily as needed for heartburn or indigestion.    . Multiple Vitamin (MULTIVITAMIN WITH MINERALS) TABS tablet Take 1 tablet by mouth daily.    Marland Kitchen testosterone cypionate (DEPOTESTOTERONE CYPIONATE) 100 MG/ML injection Inject 1.25 mLs (125 mg total) into the muscle every 14 (fourteen) days. For IM use only 10 mL 5  . zolpidem (AMBIEN) 10 MG tablet take 1 tablet by mouth at bedtime if needed 30 tablet 0  . clindamycin (CLEOCIN) 150 MG capsule Take 3 capsules (450 mg total) by mouth 3 (three) times daily. (Patient not taking: Reported on 02/28/2016) 90 capsule 0  . cyclobenzaprine (FLEXERIL) 10 MG tablet Take 1 tablet (10 mg total) by mouth 3 (three) times daily as needed for muscle spasms. (Patient not taking: Reported on 02/28/2016) 30 tablet 0  .  magnesium oxide (MAG-OX) 400 (241.3 Mg) MG tablet Take 400 mg by mouth daily.    Marland Kitchen oxyCODONE-acetaminophen (ROXICET) 5-325 MG tablet Take 1 tablet by mouth every 6 (six) hours as needed for severe pain. (Patient not taking: Reported on 02/28/2016) 6 tablet 0  . tamsulosin (FLOMAX) 0.4 MG CAPS capsule Take 1 capsule (0.4 mg total) by mouth daily. (Patient not taking: Reported on 02/28/2016) 30 capsule 3   No facility-administered medications prior to visit.     Review of Systems;  Patient denies headache, fevers, malaise, unintentional weight loss, skin rash, eye pain, sinus congestion and sinus pain, sore throat, dysphagia,  hemoptysis , cough, dyspnea, wheezing, chest pain, palpitations, orthopnea, edema, abdominal pain, nausea, melena, diarrhea, constipation, flank pain, dysuria, hematuria, urinary  Frequency, nocturia, numbness, tingling, seizures,  Focal weakness, Loss of consciousness,  Tremor, insomnia, depression, anxiety, and suicidal ideation.      Objective:  BP (!) 178/120   Pulse 93   Temp 98.4 F (36.9 C) (Oral)   Resp 12   Ht 5\' 7"  (1.702 m)   Wt 214 lb (97.1 kg)   SpO2 96%   BMI 33.52 kg/m   BP Readings from Last 3 Encounters:  02/28/16 (!) 178/120  07/15/15 (!) 131/91  07/01/15 130/88    Wt Readings from Last 3 Encounters:  02/28/16 214 lb (97.1 kg)  07/15/15 195 lb (88.5 kg)  07/01/15 208 lb 4 oz (94.5 kg)    General appearance: alert, cooperative and appears stated age  Ears: normal TM's and external ear canals both ears Throat: lips, mucosa, and tongue normal; teeth and gums normal Neck: no adenopathy, no carotid bruit, supple, symmetrical, trachea midline and thyroid not enlarged, symmetric, no tenderness/mass/nodules Back: symmetric, no curvature. ROM normal. No CVA tenderness. Lungs: clear to auscultation bilaterally Heart: regular rate and rhythm, S1, S2 normal, no murmur, click, rub or gallop Abdomen: soft, non-tender; bowel sounds normal; no masses,   no organomegaly Pulses: 2+ and symmetric Skin: Skin color, texture, turgor normal. No rashes or lesions Lymph nodes: Cervical, supraclavicular, and axillary nodes normal.  Lab Results  Component Value Date   HGBA1C 5.7 01/06/2016    Lab Results  Component Value Date   CREATININE 1.41 02/28/2016   CREATININE 1.25 01/06/2016   CREATININE 1.21 08/29/2015    Lab Results  Component Value Date   WBC 8.6 02/28/2016   HGB 15.6 02/28/2016   HCT 45.7 02/28/2016   PLT 246.0 02/28/2016   GLUCOSE 86 02/28/2016   CHOL 118 02/28/2016   TRIG 240.0 (H) 02/28/2016   HDL 31.60 (L) 02/28/2016   LDLDIRECT 69.0 02/28/2016   LDLCALC 63 01/06/2016   ALT 21 02/28/2016   AST 28 02/28/2016   NA 138 02/28/2016   K 4.0 02/28/2016   CL 102 02/28/2016   CREATININE 1.41 02/28/2016   BUN 11 02/28/2016   CO2 29 02/28/2016   TSH 1.88 02/21/2015   PSA 0.74 04/30/2014   HGBA1C 5.7 01/06/2016   MICROALBUR 0.7 02/28/2016    Ct Soft Tissue Neck W Contrast  Result Date: 07/15/2015 CLINICAL DATA:  RIGHT-sided neck swelling with difficulty swallowing. Tenderness for 12 hours. EXAM: CT NECK WITH CONTRAST TECHNIQUE: Multidetector CT imaging of the neck was performed using the standard protocol following the bolus administration of intravenous contrast. CONTRAST:  37mL OMNIPAQUE IOHEXOL 300 MG/ML  SOLN COMPARISON:  None. FINDINGS: Pharynx and larynx: Normal Salivary glands: The RIGHT submandibular gland is significantly enlarged and edematous. I do not see an obstructing calculus but passage of such cannot be excluded. There is no visible sialolithiasis within the gland and no visible ductal dilatation. Acute sialadenitis secondary to bacterial, viral, or autoimmune etiologies is more likely. There is edema of the subcutaneous soft tissues and fat. There may be extravasation of fluid into the regional soft tissues. The LEFT submandibular gland, and BILATERAL parotid glands appear normal. Thyroid: Normal. Lymph  nodes: No pathologic adenopathy. Vascular: Patent. Limited intracranial: Negative. Visualized orbits: Negative. Mastoids and visualized paranasal sinuses: No paranasal sinus disease or layering mastoid fluid. Skeleton: Spondylosis. Significant stenosis may be present at C6-7 due to anterior and posterior compressive pathology, including calcified ligamentum flavum and central disc osteophyte complex. Upper chest: Clear. IMPRESSION: Marked enlargement and edema of the RIGHT submandibular gland. Bacterial, viral, or autoimmune causes of sialadenitis are favored. No visible calculi. Electronically Signed   By: Staci Righter M.D.   On: 07/15/2015 18:33    Assessment & Plan:   Problem List Items Addressed This Visit    Hepatic steatosis    Found in the setting of rapid wt loss of 25 lbs,  By CT scan, Considered to be due to rapid weight loss per GI .Marland Kitchen Liver enzymes are normal.   Lab Results  Component Value Date   ALT 21 02/28/2016   AST 28 02/28/2016   ALKPHOS 52 02/28/2016   BILITOT 0.5 02/28/2016          Elevated blood-pressure reading without diagnosis of hypertension    He was given 0.1mg   clonidine today and prescribed amlodipine 2.5 mg if BO was > 130/80 in the morning  Testosterone has been stopped.  Renal function is normal and urine is negative for protein.        Relevant Orders   Comprehensive metabolic panel (Completed)   Microalbumin / creatinine urine ratio (Completed)   Primary hypogonadism in male - Primary   Relevant Orders   Testos,Total,Free and SHBG (Male)    Other Visit Diagnoses    Long-term use of high-risk medication       Relevant Orders   CBC with Differential/Platelet (Completed)   Direct LDL (Completed)   Lipid panel (Completed)     A total of 25 minutes of face to face time was spent with patient more than half of which was spent in counselling and coordination of care   I am having Mr. Dacko start on amLODipine. I am also having him maintain his  tamsulosin, multivitamin with minerals, magnesium oxide, B-complex with vitamin C, famotidine, aspirin-acetaminophen-caffeine, clindamycin, oxyCODONE-acetaminophen, testosterone cypionate, cyclobenzaprine, and zolpidem.  Meds ordered this encounter  Medications  . amLODipine (NORVASC) 2.5 MG tablet    Sig: Take 1 tablet (2.5 mg total) by mouth daily.    Dispense:  90 tablet    Refill:  3    There are no discontinued medications.  Follow-up: No Follow-up on file.   Crecencio Mc, MD

## 2016-02-28 NOTE — Patient Instructions (Addendum)
Your blood pressure is VERY elevated today 160/100 when I rechecked it,  You were given clonidine 0.1 mg in the office to bring it down gradually  It make make you drowsy.  Stop aleve and advil.  Do NOT RESUME testosterone until further notice.   Please start checking your blood pressure daily . Start taking  the amlodipine  At  2.5 mg daily if BP is > 140/80 tomorrow   Checking  testosterone level , CBC today etc

## 2016-02-29 ENCOUNTER — Encounter: Payer: Self-pay | Admitting: Internal Medicine

## 2016-02-29 LAB — CBC WITH DIFFERENTIAL/PLATELET
BASOS PCT: 1.3 % (ref 0.0–3.0)
Basophils Absolute: 0.1 10*3/uL (ref 0.0–0.1)
EOS ABS: 0.2 10*3/uL (ref 0.0–0.7)
Eosinophils Relative: 2.9 % (ref 0.0–5.0)
HEMATOCRIT: 45.7 % (ref 39.0–52.0)
HEMOGLOBIN: 15.6 g/dL (ref 13.0–17.0)
LYMPHS PCT: 20.7 % (ref 12.0–46.0)
Lymphs Abs: 1.8 10*3/uL (ref 0.7–4.0)
MCHC: 34.2 g/dL (ref 30.0–36.0)
MCV: 86.5 fl (ref 78.0–100.0)
MONO ABS: 0.9 10*3/uL (ref 0.1–1.0)
Monocytes Relative: 10.4 % (ref 3.0–12.0)
Neutro Abs: 5.6 10*3/uL (ref 1.4–7.7)
Neutrophils Relative %: 64.7 % (ref 43.0–77.0)
PLATELETS: 246 10*3/uL (ref 150.0–400.0)
RBC: 5.29 Mil/uL (ref 4.22–5.81)
RDW: 12.7 % (ref 11.5–15.5)
WBC: 8.6 10*3/uL (ref 4.0–10.5)

## 2016-02-29 LAB — COMPREHENSIVE METABOLIC PANEL
ALBUMIN: 4.1 g/dL (ref 3.5–5.2)
ALK PHOS: 52 U/L (ref 39–117)
ALT: 21 U/L (ref 0–53)
AST: 28 U/L (ref 0–37)
BUN: 11 mg/dL (ref 6–23)
CALCIUM: 9.7 mg/dL (ref 8.4–10.5)
CHLORIDE: 102 meq/L (ref 96–112)
CO2: 29 mEq/L (ref 19–32)
CREATININE: 1.41 mg/dL (ref 0.40–1.50)
GFR: 54.76 mL/min — ABNORMAL LOW (ref >60.00)
Glucose, Bld: 86 mg/dL (ref 70–99)
POTASSIUM: 4 meq/L (ref 3.5–5.1)
SODIUM: 138 meq/L (ref 135–145)
TOTAL PROTEIN: 7.1 g/dL (ref 6.0–8.3)
Total Bilirubin: 0.5 mg/dL (ref 0.2–1.2)

## 2016-02-29 LAB — MICROALBUMIN / CREATININE URINE RATIO
Creatinine,U: 144.9 mg/dL
MICROALB/CREAT RATIO: 0.5 mg/g (ref 0.0–30.0)
Microalb, Ur: 0.7 mg/dL (ref 0.0–1.9)

## 2016-02-29 LAB — LDL CHOLESTEROL, DIRECT: Direct LDL: 69 mg/dL

## 2016-02-29 LAB — LIPID PANEL
CHOL/HDL RATIO: 4
Cholesterol: 118 mg/dL (ref 0–200)
HDL: 31.6 mg/dL — ABNORMAL LOW (ref >39.00)
NONHDL: 86.62
TRIGLYCERIDES: 240 mg/dL — AB (ref 0.0–149.0)
VLDL: 48 mg/dL — ABNORMAL HIGH (ref 0.0–40.0)

## 2016-02-29 NOTE — Assessment & Plan Note (Signed)
Found in the setting of rapid wt loss of 25 lbs,  By CT scan, Considered to be due to rapid weight loss per GI .Marland Kitchen Liver enzymes are normal.   Lab Results  Component Value Date   ALT 21 02/28/2016   AST 28 02/28/2016   ALKPHOS 52 02/28/2016   BILITOT 0.5 02/28/2016

## 2016-02-29 NOTE — Assessment & Plan Note (Signed)
He was given 0.1mg  clonidine today and prescribed amlodipine 2.5 mg if BO was > 130/80 in the morning  Testosterone has been stopped.  Renal function is normal and urine is negative for protein.

## 2016-03-01 ENCOUNTER — Other Ambulatory Visit (INDEPENDENT_AMBULATORY_CARE_PROVIDER_SITE_OTHER): Payer: BC Managed Care – PPO

## 2016-03-01 ENCOUNTER — Other Ambulatory Visit: Payer: Self-pay | Admitting: Internal Medicine

## 2016-03-01 DIAGNOSIS — R3911 Hesitancy of micturition: Secondary | ICD-10-CM

## 2016-03-01 DIAGNOSIS — Z125 Encounter for screening for malignant neoplasm of prostate: Secondary | ICD-10-CM

## 2016-03-01 LAB — URINALYSIS, ROUTINE W REFLEX MICROSCOPIC
Bilirubin Urine: NEGATIVE
Hgb urine dipstick: NEGATIVE
Ketones, ur: NEGATIVE
Leukocytes, UA: NEGATIVE
NITRITE: NEGATIVE
PH: 5.5 (ref 5.0–8.0)
RBC / HPF: NONE SEEN (ref 0–?)
Total Protein, Urine: NEGATIVE
Urine Glucose: NEGATIVE
Urobilinogen, UA: 0.2 (ref 0.0–1.0)
WBC UA: NONE SEEN (ref 0–?)

## 2016-03-01 LAB — POCT URINALYSIS DIPSTICK
BILIRUBIN UA: NEGATIVE
GLUCOSE UA: NEGATIVE
KETONES UA: NEGATIVE
Leukocytes, UA: NEGATIVE
Nitrite, UA: NEGATIVE
Protein, UA: NEGATIVE
RBC UA: NEGATIVE
UROBILINOGEN UA: 0.2
pH, UA: 5

## 2016-03-01 LAB — PSA: PSA: 1.07 ng/mL (ref 0.10–4.00)

## 2016-03-02 ENCOUNTER — Encounter: Payer: Self-pay | Admitting: Internal Medicine

## 2016-03-02 LAB — URINE CULTURE: Organism ID, Bacteria: NO GROWTH

## 2016-03-04 ENCOUNTER — Other Ambulatory Visit: Payer: Self-pay | Admitting: Internal Medicine

## 2016-03-05 NOTE — Telephone Encounter (Signed)
OK to refill last OV 02/28/16

## 2016-03-06 LAB — TESTOS,TOTAL,FREE AND SHBG (FEMALE)
SEX HORMONE BINDING GLOB.: 23 nmol/L (ref 22–77)
TESTOSTERONE,TOTAL,LC/MS/MS: 605 ng/dL (ref 250–1100)
Testosterone, Free: 115 pg/mL (ref 35.0–155.0)

## 2016-03-06 NOTE — Telephone Encounter (Signed)
REFILLED

## 2016-03-07 ENCOUNTER — Encounter: Payer: Self-pay | Admitting: Internal Medicine

## 2016-03-26 IMAGING — CT CT ABD-PELV W/ CM
2 of 5 series · 16 of 46 positions shown, 18 images · IV contrast (omnipaque)
Comparison: 11/17/2010

CLINICAL DATA: Abdominal discomfort for a few days with difficulty
urinating, history of diverticulitis and prior colectomy

EXAM:
CT ABDOMEN AND PELVIS WITH CONTRAST
TECHNIQUE: Multidetector CT imaging of the abdomen and pelvis was performed
using the standard protocol following bolus administration of
intravenous contrast.
CONTRAST:  100mL OMNIPAQUE IOHEXOL 300 MG/ML  SOLN

[Series 2: routine with · axial · 0.71mm/px · z∈[-1348,-908]mm · 13 of 100 slices shown, 15 images]
[im 6/100  soft-tissue]
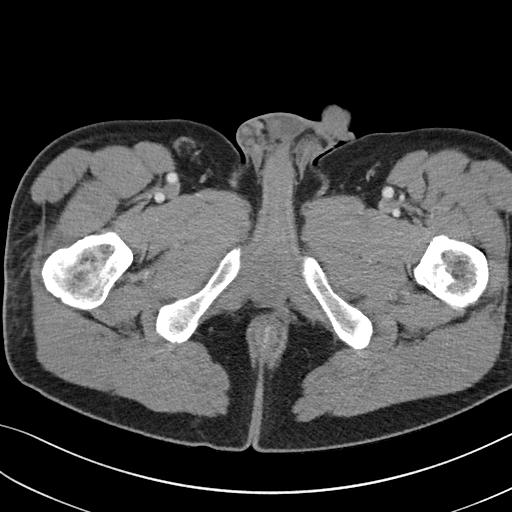
[im 6/100  bone]
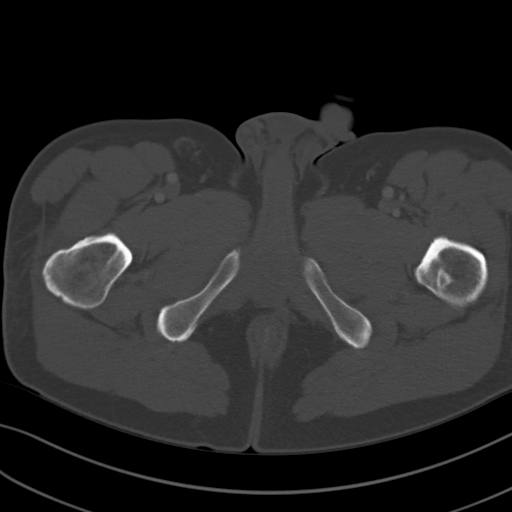
[im 16/100  soft-tissue]
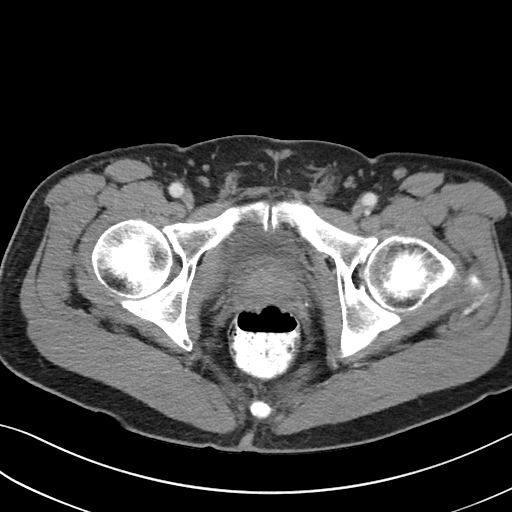
[im 21/100  soft-tissue]
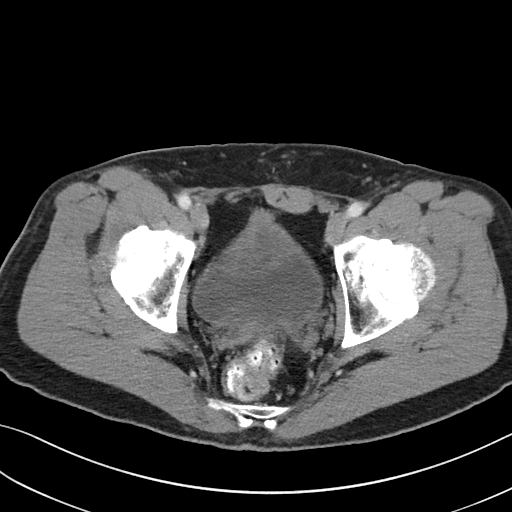
[im 27/100  soft-tissue]
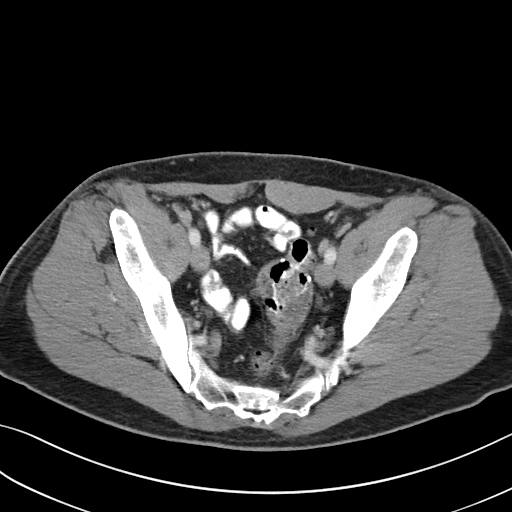
[im 37/100  soft-tissue]
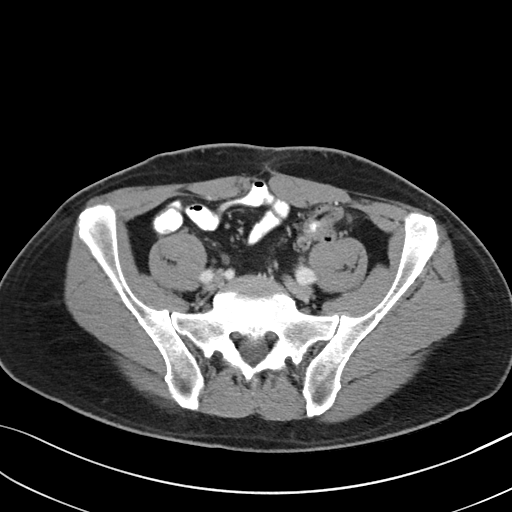
[im 42/100  soft-tissue]
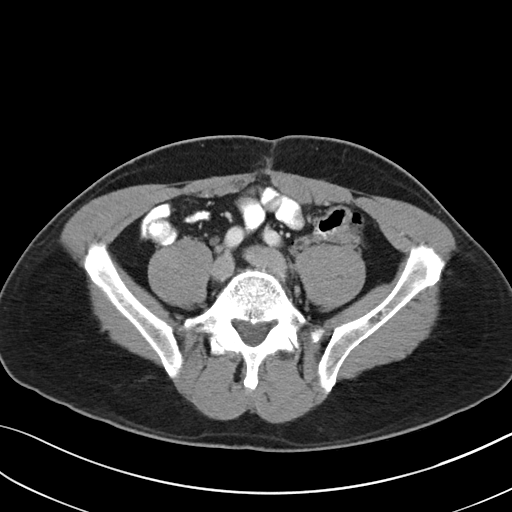
[im 53/100  soft-tissue]
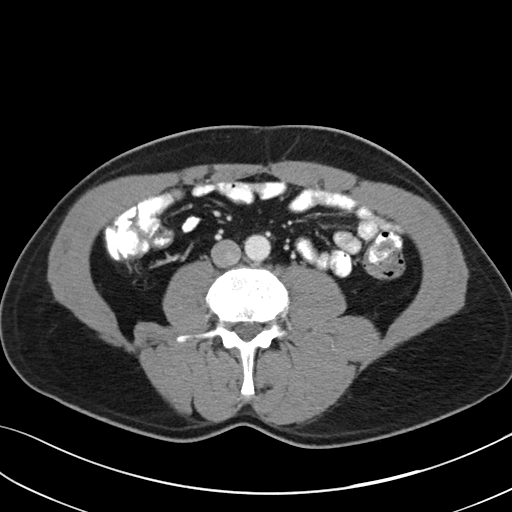
[im 58/100  soft-tissue]
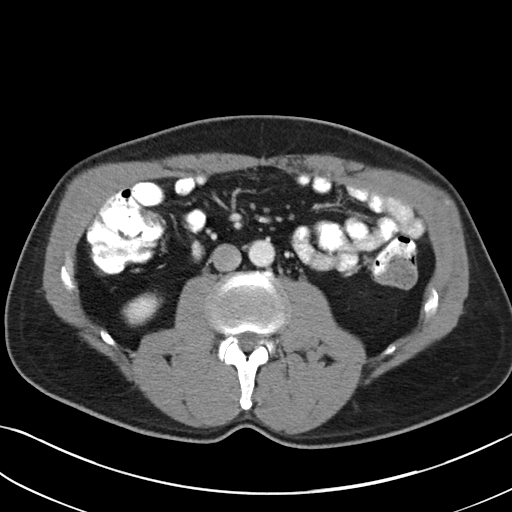
[im 63/100  soft-tissue]
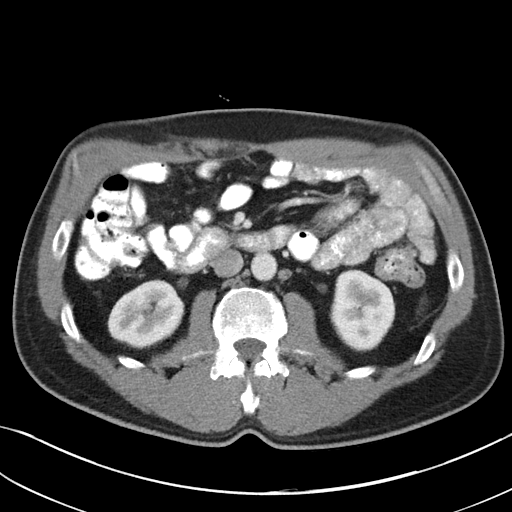
[im 63/100  bone]
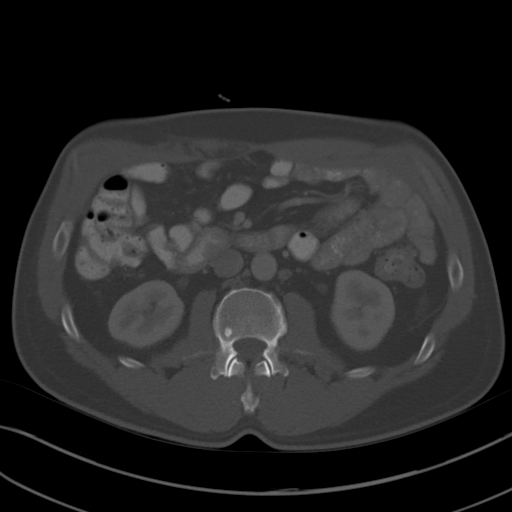
[im 73/100  soft-tissue]
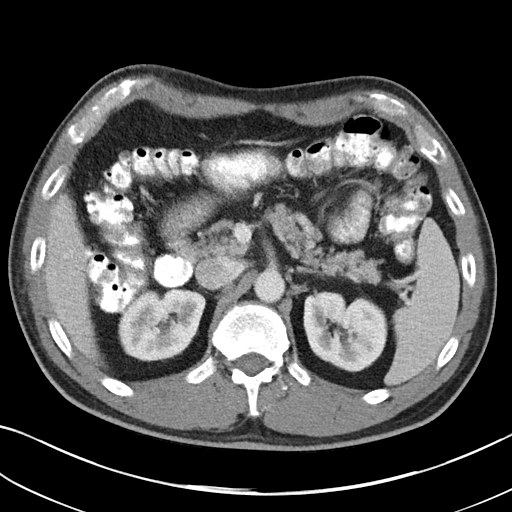
[im 79/100  soft-tissue]
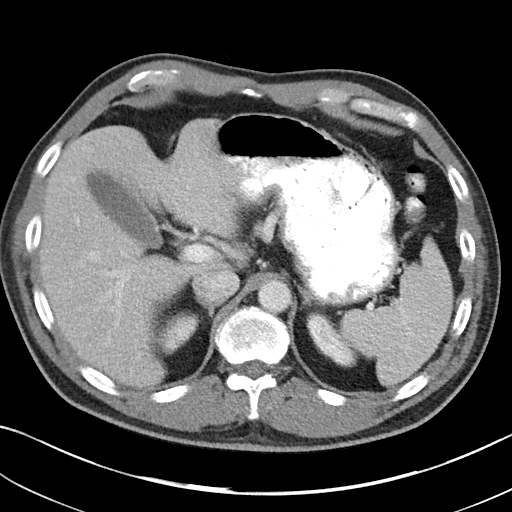
[im 84/100  soft-tissue]
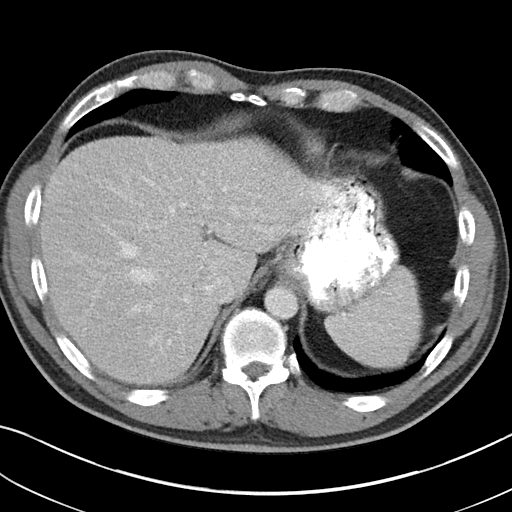
[im 94/100  soft-tissue]
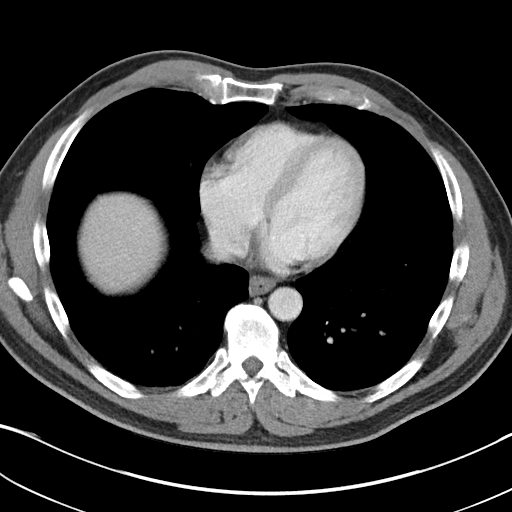

[Series 5: cor routine with · coronal · 0.78mm/px · 3 of 150 slices shown]
[im 50/150  soft-tissue]
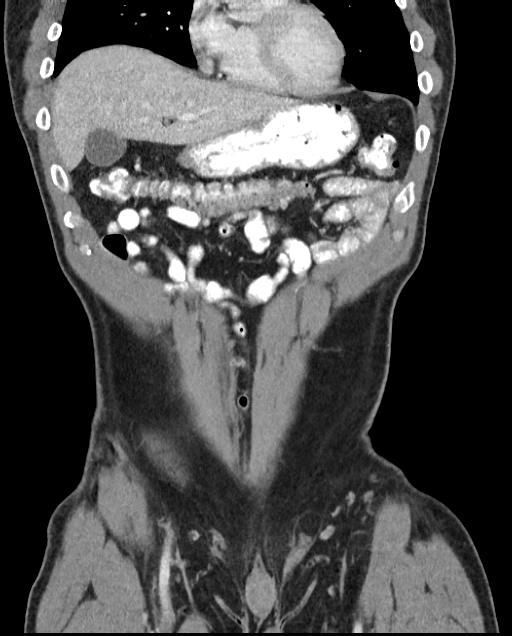
[im 67/150  soft-tissue]
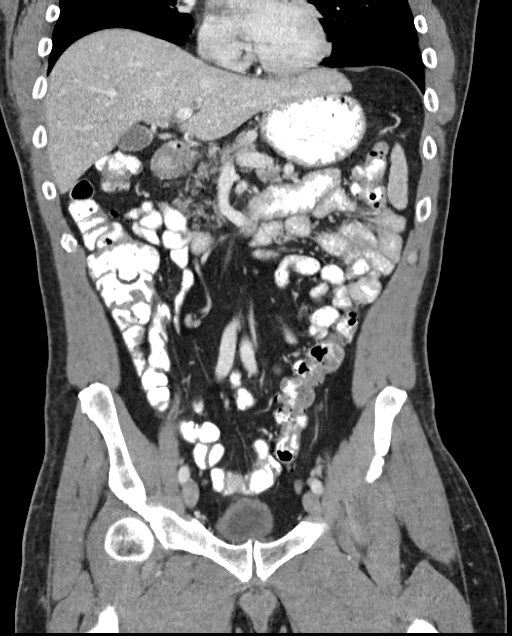
[im 83/150  soft-tissue]
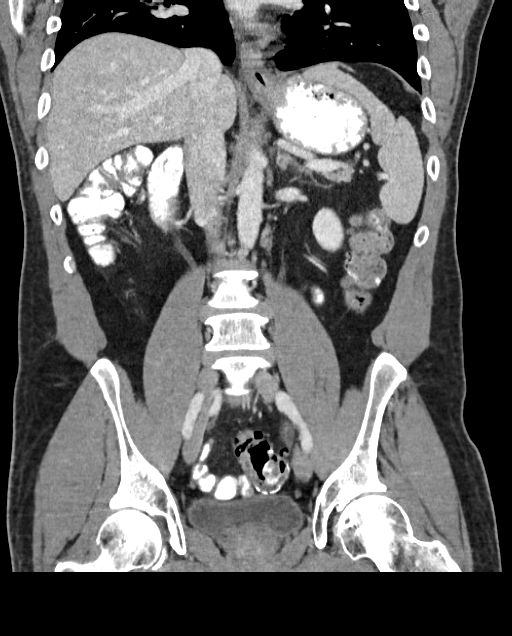

[16 of 46 positions shown; findings below may reference images not displayed]

FINDINGS: The lung bases are free of acute infiltrate or sizable effusion.

The liver is diffusely decreased in attenuation consistent with
fatty infiltration. The spleen, gallbladder, adrenal glands and
pancreas are within normal limits with the exception of a right
adrenal lesion measuring 19 mm in greatest dimension. This is
increased in size from the prior exam but still likely represents an
adenoma.

Kidneys demonstrate a normal enhancement pattern. No renal calculi
or obstructive changes are seen. The ureters are well visualized
bilaterally and no calculi are noted.

The appendix is within normal limits. Diverticular change of the
colon is noted as well as changes of prior partial colectomy in the
sigmoid colon. The anastomotic site appears patent. No free pelvic
fluid is seen. No findings to suggest diverticulitis are seen. Bony
structures are within normal limits for the patient's given age.

The bladder is partially distended. No pelvic mass lesion or
sidewall abnormality is noted.
IMPRESSION: Diverticulosis without diverticulitis. Changes of prior sigmoid
colectomy are noted.

Right adrenal lesion likely representing an adenoma.

No other focal abnormality is seen.

## 2016-04-04 ENCOUNTER — Other Ambulatory Visit: Payer: Self-pay | Admitting: Gastroenterology

## 2016-04-19 ENCOUNTER — Encounter: Payer: Self-pay | Admitting: Internal Medicine

## 2016-04-20 ENCOUNTER — Ambulatory Visit (INDEPENDENT_AMBULATORY_CARE_PROVIDER_SITE_OTHER): Payer: BC Managed Care – PPO | Admitting: Family Medicine

## 2016-04-20 DIAGNOSIS — J988 Other specified respiratory disorders: Secondary | ICD-10-CM | POA: Diagnosis not present

## 2016-04-20 DIAGNOSIS — B9789 Other viral agents as the cause of diseases classified elsewhere: Secondary | ICD-10-CM | POA: Diagnosis not present

## 2016-04-20 MED ORDER — HYDROCOD POLST-CPM POLST ER 10-8 MG/5ML PO SUER: 5.0000 mL | Freq: Two times a day (BID) | ORAL | 0 refills | Status: DC | PRN

## 2016-04-20 NOTE — Patient Instructions (Signed)
This is viral  Sudafed as needed.   Tylenol as needed.  Use the cough medication as needed.  Please call if you fail to improve or worsen.  Take care  Dr. Lacinda Axon

## 2016-04-20 NOTE — Assessment & Plan Note (Signed)
New acute problem. Viral in etiology. Treating with supportive care, Sudafed, Tylenol. I also gave him a prescription for Tussionex as he is having trouble with cough at night.

## 2016-04-20 NOTE — Progress Notes (Signed)
Pre visit review using our clinic review tool, if applicable. No additional management support is needed unless otherwise documented below in the visit note. 

## 2016-04-20 NOTE — Progress Notes (Signed)
Subjective:  Patient ID: Seth Doyle, male    DOB: 1955/11/08  Age: 60 y.o. MRN: [id number]  CC: Cold symptoms  HPI:  60 year old male presents for an acute visit with respiratory complaints.  Patient states that he's been sick for approximately 1 week. Started over the weekend. He's been experiencing sore throat, cough, sinus pressure, headache. Cough is productive of discolored sputum. No associated fevers or chills. He's been using over-the-counter Tylenol and Sudafed with some relief. He also used an old prescription cough medication with some improvement. No known exacerbating factors. No other associated symptoms. No other complex this time.  Social Hx   Social History   Social History  . Marital status: Married    Spouse name: N/A  . Number of children: N/A  . Years of education: N/A   Social History Main Topics  . Smoking status: Never Smoker  . Smokeless tobacco: Never Used  . Alcohol use Yes     Comment: rare - once /year  . Drug use: No  . Sexual activity: Yes   Other Topics Concern  . Not on file   Social History Narrative  . No narrative on file    Review of Systems  Constitutional: Negative for chills and fever.  HENT: Positive for sinus pressure and sore throat.   Respiratory: Positive for cough.   Neurological: Positive for headaches.   Objective:  BP 125/85 (BP Location: Left Arm, Patient Position: Sitting, Cuff Size: Large)   Pulse 90   Temp 98.4 F (36.9 C) (Oral)   Resp 12   Wt 219 lb 4 oz (99.5 kg)   SpO2 97%   BMI 34.34 kg/m   BP/Weight 04/20/2016 02/28/2016 [id number]  Systolic BP 0000000 0000000 [id number]  Diastolic BP 85 123456 91  Wt. (Lbs) 219.25 214 195  BMI 34.34 33.52 27.21   Physical Exam  Constitutional: He is oriented to person, place, and time. He appears well-developed. No distress.  HENT:  Mouth/Throat: Oropharynx is clear and moist.  Normal TMs bilaterally.  Neck: Neck supple.  No adenopathy.  Cardiovascular: Normal rate and  regular rhythm.   Pulmonary/Chest: Effort normal and breath sounds normal. He has no wheezes. He has no rales.  Neurological: He is alert and oriented to person, place, and time.  Psychiatric: He has a normal mood and affect.  Vitals reviewed.   Lab Results  Component Value Date   WBC 8.6 02/28/2016   HGB 15.6 02/28/2016   HCT 45.7 02/28/2016   PLT 246.0 02/28/2016   GLUCOSE 86 02/28/2016   CHOL 118 02/28/2016   TRIG 240.0 (H) 02/28/2016   HDL 31.60 (L) 02/28/2016   LDLDIRECT 69.0 02/28/2016   LDLCALC 63 01/06/2016   ALT 21 02/28/2016   AST 28 02/28/2016   NA 138 02/28/2016   K 4.0 02/28/2016   CL 102 02/28/2016   CREATININE 1.41 02/28/2016   BUN 11 02/28/2016   CO2 29 02/28/2016   TSH 1.88 02/21/2015   PSA 1.07 03/01/2016   HGBA1C 5.7 01/06/2016   MICROALBUR 0.7 02/28/2016    Assessment & Plan:   Problem List Items Addressed This Visit    Viral respiratory infection    New acute problem. Viral in etiology. Treating with supportive care, Sudafed, Tylenol. I also gave him a prescription for Tussionex as he is having trouble with cough at night.        Meds ordered this encounter  Medications  . chlorpheniramine-HYDROcodone (TUSSIONEX PENNKINETIC ER) 10-8 MG/5ML SUER  Sig: Take 5 mLs by mouth every 12 (twelve) hours as needed.    Dispense:  115 mL    Refill:  0    Follow-up: PRN  [place]

## 2016-06-28 ENCOUNTER — Encounter: Payer: Self-pay | Admitting: Internal Medicine

## 2016-07-02 ENCOUNTER — Ambulatory Visit (INDEPENDENT_AMBULATORY_CARE_PROVIDER_SITE_OTHER): Payer: BC Managed Care – PPO | Admitting: Internal Medicine

## 2016-07-02 ENCOUNTER — Encounter: Payer: Self-pay | Admitting: Internal Medicine

## 2016-07-02 VITALS — BP 114/80 | HR 86 | Temp 97.4°F | Wt 211.0 lb

## 2016-07-02 DIAGNOSIS — K760 Fatty (change of) liver, not elsewhere classified: Secondary | ICD-10-CM

## 2016-07-02 DIAGNOSIS — K2100 Gastro-esophageal reflux disease with esophagitis, without bleeding: Secondary | ICD-10-CM

## 2016-07-02 DIAGNOSIS — R634 Abnormal weight loss: Secondary | ICD-10-CM | POA: Diagnosis not present

## 2016-07-02 DIAGNOSIS — R140 Abdominal distension (gaseous): Secondary | ICD-10-CM

## 2016-07-02 DIAGNOSIS — K909 Intestinal malabsorption, unspecified: Secondary | ICD-10-CM

## 2016-07-02 DIAGNOSIS — I10 Essential (primary) hypertension: Secondary | ICD-10-CM

## 2016-07-02 MED ORDER — DEXLANSOPRAZOLE 30 MG PO CPDR: 30.0000 mg | DELAYED_RELEASE_CAPSULE | Freq: Every day | ORAL | 3 refills | Status: DC

## 2016-07-02 NOTE — Progress Notes (Signed)
Subjective:  Patient ID: Seth Doyle, male    DOB: March 02, 1956  Age: 61 y.o. MRN: [id number]  CC: The primary encounter diagnosis was Steatorrhea. Diagnoses of Weight loss, abnormal, Abdominal bloating, Reflux esophagitis, Hepatic steatosis, and Essential hypertension were also pertinent to this visit.  HPI Seth Doyle presents for EVALUATION OF BLOATING,  EARLY SATIETY AND NAUSEA ACCOMPANIED BY  BODY ACHES and weight loss.  Symptoms started two weeks ago.  No fevers,  respiroatory symptoms,  Or vomiting.    Had similar symptoms in 2016 and was treated for small bowel overgrowth by Dr Waynetta Sandy a 2 week course of  rifaxamin.  Diagnosis was made based on history and elevated fecal fat    Outpatient Medications Prior to Visit  Medication Sig Dispense Refill  . amLODipine (NORVASC) 2.5 MG tablet Take 1 tablet (2.5 mg total) by mouth daily. 90 tablet 3  . aspirin-acetaminophen-caffeine (EXCEDRIN MIGRAINE) 250-250-65 MG tablet Take 2 tablets by mouth every 6 (six) hours as needed for headache.    . B Complex-C (B-COMPLEX WITH VITAMIN C) tablet Take 1 tablet by mouth daily.    . cyclobenzaprine (FLEXERIL) 10 MG tablet Take 1 tablet (10 mg total) by mouth 3 (three) times daily as needed for muscle spasms. 30 tablet 0  . famotidine (PEPCID) 40 MG tablet Take 40 mg by mouth daily as needed for heartburn or indigestion.    . magnesium oxide (MAG-OX) 400 (241.3 Mg) MG tablet Take 400 mg by mouth daily.    . Multiple Vitamin (MULTIVITAMIN WITH MINERALS) TABS tablet Take 1 tablet by mouth daily.    . tamsulosin (FLOMAX) 0.4 MG CAPS capsule Take 1 capsule (0.4 mg total) by mouth daily. 30 capsule 3  . zolpidem (AMBIEN) 10 MG tablet take 1 tablet by mouth at bedtime if needed 30 tablet 5  . clindamycin (CLEOCIN) 150 MG capsule Take 3 capsules (450 mg total) by mouth 3 (three) times daily. 90 capsule 0  . DEXILANT 60 MG capsule take 1 capsule by mouth once daily 30 capsule 11  .  chlorpheniramine-HYDROcodone (TUSSIONEX PENNKINETIC ER) 10-8 MG/5ML SUER Take 5 mLs by mouth every 12 (twelve) hours as needed. 115 mL 0  . oxyCODONE-acetaminophen (ROXICET) 5-325 MG tablet Take 1 tablet by mouth every 6 (six) hours as needed for severe pain. (Patient not taking: Reported on 04/20/2016) 6 tablet 0  . testosterone cypionate (DEPOTESTOTERONE CYPIONATE) 100 MG/ML injection Inject 1.25 mLs (125 mg total) into the muscle every 14 (fourteen) days. For IM use only (Patient not taking: Reported on 04/20/2016) 10 mL 5   No facility-administered medications prior to visit.     Review of Systems;  Patient denies headache, fevers, malaise,skin rash, eye pain, sinus congestion and sinus pain, sore throat, dysphagia,  hemoptysis , cough, dyspnea, wheezing, chest pain, palpitations, orthopnea, edema, abdominal pain,  melena,  constipation, flank pain, dysuria, hematuria, urinary  Frequency, nocturia, numbness, tingling, seizures,  Focal weakness, Loss of consciousness,  Tremor, insomnia, depression, anxiety, and suicidal ideation.      Objective:  BP 114/80   Pulse 86   Temp 97.4 F (36.3 C) (Oral)   Wt 211 lb (95.7 kg)   SpO2 95%   BMI 33.05 kg/m   BP Readings from Last 3 Encounters:  07/02/16 114/80  04/20/16 125/85  02/28/16 (!) 178/120    Wt Readings from Last 3 Encounters:  07/02/16 211 lb (95.7 kg)  04/20/16 219 lb 4 oz (99.5 kg)  02/28/16 214 lb (97.1  kg)    General appearance: alert, cooperative and appears stated age Ears: normal TM's and external ear canals both ears Throat: lips, mucosa, and tongue normal; teeth and gums normal Neck: no adenopathy, no carotid bruit, supple, symmetrical, trachea midline and thyroid not enlarged, symmetric, no tenderness/mass/nodules Back: symmetric, no curvature. ROM normal. No CVA tenderness. Lungs: clear to auscultation bilaterally Heart: regular rate and rhythm, S1, S2 normal, no murmur, click, rub or gallop Abdomen: soft,  non-tender; bowel sounds normal; no masses,  no organomegaly Pulses: 2+ and symmetric Skin: Skin color, texture, turgor normal. No rashes or lesions Lymph nodes: Cervical, supraclavicular, and axillary nodes normal.  Lab Results  Component Value Date   HGBA1C 5.7 01/06/2016    Lab Results  Component Value Date   CREATININE 1.27 07/02/2016   CREATININE 1.41 02/28/2016   CREATININE 1.25 01/06/2016    Lab Results  Component Value Date   WBC 6.5 07/02/2016   HGB 14.8 07/02/2016   HCT 43.2 07/02/2016   PLT 222.0 07/02/2016   GLUCOSE 89 07/02/2016   CHOL 118 02/28/2016   TRIG 240.0 (H) 02/28/2016   HDL 31.60 (L) 02/28/2016   LDLDIRECT 69.0 02/28/2016   LDLCALC 63 01/06/2016   ALT 40 07/02/2016   AST 35 07/02/2016   NA 136 07/02/2016   K 4.3 07/02/2016   CL 104 07/02/2016   CREATININE 1.27 07/02/2016   BUN 21 07/02/2016   CO2 28 07/02/2016   TSH 1.88 02/21/2015   PSA 1.07 03/01/2016   HGBA1C 5.7 01/06/2016   MICROALBUR 0.7 02/28/2016     Assessment & Plan:   Problem List Items Addressed This Visit    Abdominal bloating    accompanie by weight loss,  Nausea and diarrhea.  Fecal fat studies ordered.  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  Will repeat rifaxamin if positive.   Lab Results  Component Value Date   ALT 40 07/02/2016   AST 35 07/02/2016   ALKPHOS 59 07/02/2016   BILITOT 0.7 07/02/2016   Lab Results  Component Value Date   LIPASE 11.0 02/04/2015         Relevant Medications   Dexlansoprazole 30 MG capsule   Other Relevant Orders   H. pylori antibody, IgG (Completed)   Fecal fat, qualitative   Essential hypertension    Well controlled on current regimen of amlodipine.  no changes today.      Hepatic steatosis    Found in the setting of rapid wt loss of 25 lbs,  By CT scan, Considered to be due to rapid weight loss per GI .. currentl his Liver enzymes are normal.   Lab Results  Component Value Date   ALT 40  07/02/2016   AST 35 07/02/2016   ALKPHOS 59 07/02/2016   BILITOT 0.7 07/02/2016          Reflux esophagitis    Chronic ,managed with dexilant due to persistent symptoms with other PPIs.  Trial of reduced dose to 30 mg daily given possible link to sibo        Other Visit Diagnoses    Steatorrhea    -  Primary   Relevant Orders   Fecal fat, qualitative   Weight loss, abnormal       Relevant Orders   Comprehensive metabolic panel (Completed)   CBC with Differential/Platelet (Completed)   Fecal fat, qualitative      I have discontinued Mr. Cureton's clindamycin, oxyCODONE-acetaminophen, testosterone cypionate, DEXILANT, and chlorpheniramine-HYDROcodone.  I am also having him start on Dexlansoprazole. Additionally, I am having him maintain his tamsulosin, multivitamin with minerals, magnesium oxide, B-complex with vitamin C, famotidine, aspirin-acetaminophen-caffeine, cyclobenzaprine, amLODipine, and zolpidem.  Meds ordered this encounter  Medications  . Dexlansoprazole 30 MG capsule    Sig: Take 1 capsule (30 mg total) by mouth daily.    Dispense:  30 capsule    Refill:  3    Medications Discontinued During This Encounter  Medication Reason  . chlorpheniramine-HYDROcodone (TUSSIONEX PENNKINETIC ER) 10-8 MG/5ML SUER Error  . DEXILANT 60 MG capsule   . clindamycin (CLEOCIN) 150 MG capsule   . oxyCODONE-acetaminophen (ROXICET) 5-325 MG tablet   . testosterone cypionate (DEPOTESTOTERONE CYPIONATE) 100 MG/ML injection     Follow-up: No Follow-up on file.   Crecencio Mc, MD

## 2016-07-03 LAB — COMPREHENSIVE METABOLIC PANEL
ALK PHOS: 59 U/L (ref 39–117)
ALT: 40 U/L (ref 0–53)
AST: 35 U/L (ref 0–37)
Albumin: 4.4 g/dL (ref 3.5–5.2)
BILIRUBIN TOTAL: 0.7 mg/dL (ref 0.2–1.2)
BUN: 21 mg/dL (ref 6–23)
CO2: 28 mEq/L (ref 19–32)
CREATININE: 1.27 mg/dL (ref 0.40–1.50)
Calcium: 10 mg/dL (ref 8.4–10.5)
Chloride: 104 mEq/L (ref 96–112)
GFR: 61.71 mL/min (ref >60.00)
GLUCOSE: 89 mg/dL (ref 70–99)
Potassium: 4.3 mEq/L (ref 3.5–5.1)
SODIUM: 136 meq/L (ref 135–145)
TOTAL PROTEIN: 7.1 g/dL (ref 6.0–8.3)

## 2016-07-03 LAB — CBC WITH DIFFERENTIAL/PLATELET
Basophils Absolute: 0 10*3/uL (ref 0.0–0.1)
Basophils Relative: 0.5 % (ref 0.0–3.0)
Eosinophils Absolute: 0.1 10*3/uL (ref 0.0–0.7)
Eosinophils Relative: 0.9 % (ref 0.0–5.0)
HCT: 43.2 % (ref 39.0–52.0)
Hemoglobin: 14.8 g/dL (ref 13.0–17.0)
Lymphocytes Relative: 28.3 % (ref 12.0–46.0)
Lymphs Abs: 1.9 10*3/uL (ref 0.7–4.0)
MCHC: 34.3 g/dL (ref 30.0–36.0)
MCV: 86.8 fl (ref 78.0–100.0)
Monocytes Absolute: 0.7 10*3/uL (ref 0.1–1.0)
Monocytes Relative: 10.6 % (ref 3.0–12.0)
Neutro Abs: 3.9 10*3/uL (ref 1.4–7.7)
Neutrophils Relative %: 59.7 % (ref 43.0–77.0)
Platelets: 222 10*3/uL (ref 150.0–400.0)
RBC: 4.98 Mil/uL (ref 4.22–5.81)
RDW: 13.1 % (ref 11.5–15.5)
WBC: 6.5 10*3/uL (ref 4.0–10.5)

## 2016-07-03 LAB — H. PYLORI ANTIBODY, IGG: H Pylori IgG: NEGATIVE

## 2016-07-04 DIAGNOSIS — R140 Abdominal distension (gaseous): Secondary | ICD-10-CM | POA: Insufficient documentation

## 2016-07-04 NOTE — Assessment & Plan Note (Signed)
Well controlled on current regimen of amlodipine. no changes today. 

## 2016-07-04 NOTE — Assessment & Plan Note (Signed)
Chronic ,managed with dexilant due to persistent symptoms with other PPIs.  Trial of reduced dose to 30 mg daily given possible link to sibo

## 2016-07-04 NOTE — Assessment & Plan Note (Signed)
Found in the setting of rapid wt loss of 25 lbs,  By CT scan, Considered to be due to rapid weight loss per GI .. currentl his Liver enzymes are normal.   Lab Results  Component Value Date   ALT 40 07/02/2016   AST 35 07/02/2016   ALKPHOS 59 07/02/2016   BILITOT 0.7 07/02/2016

## 2016-07-04 NOTE — Assessment & Plan Note (Signed)
accompanie by weight loss,  Nausea and diarrhea.  Fecal fat studies ordered.  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  Will repeat rifaxamin if positive.   Lab Results  Component Value Date   ALT 40 07/02/2016   AST 35 07/02/2016   ALKPHOS 59 07/02/2016   BILITOT 0.7 07/02/2016   Lab Results  Component Value Date   LIPASE 11.0 02/04/2015

## 2016-07-09 ENCOUNTER — Other Ambulatory Visit: Payer: BC Managed Care – PPO

## 2016-07-09 DIAGNOSIS — R634 Abnormal weight loss: Secondary | ICD-10-CM

## 2016-07-09 DIAGNOSIS — R140 Abdominal distension (gaseous): Secondary | ICD-10-CM

## 2016-07-09 DIAGNOSIS — K909 Intestinal malabsorption, unspecified: Secondary | ICD-10-CM

## 2016-07-10 LAB — FECAL FAT, QUALITATIVE
FAT QUAL NEUTRAL STL: NORMAL
Fat Qual Total, Stl: NORMAL

## 2016-07-11 ENCOUNTER — Encounter: Payer: Self-pay | Admitting: Internal Medicine

## 2016-07-30 ENCOUNTER — Encounter: Payer: Self-pay | Admitting: Internal Medicine

## 2016-09-14 ENCOUNTER — Other Ambulatory Visit: Payer: Self-pay | Admitting: Internal Medicine

## 2016-10-17 ENCOUNTER — Encounter: Payer: Self-pay | Admitting: Internal Medicine

## 2016-10-17 DIAGNOSIS — M130 Polyarthritis, unspecified: Secondary | ICD-10-CM

## 2016-10-19 ENCOUNTER — Other Ambulatory Visit: Payer: Self-pay | Admitting: Internal Medicine

## 2016-10-19 MED ORDER — RIFAXIMIN 550 MG PO TABS: 550.0000 mg | ORAL_TABLET | Freq: Two times a day (BID) | ORAL | 0 refills | Status: DC

## 2016-11-22 ENCOUNTER — Encounter: Payer: Self-pay | Admitting: Internal Medicine

## 2016-11-22 ENCOUNTER — Other Ambulatory Visit: Payer: Self-pay | Admitting: Internal Medicine

## 2016-11-22 DIAGNOSIS — R110 Nausea: Secondary | ICD-10-CM

## 2016-11-22 DIAGNOSIS — R197 Diarrhea, unspecified: Secondary | ICD-10-CM

## 2016-11-22 DIAGNOSIS — R140 Abdominal distension (gaseous): Secondary | ICD-10-CM

## 2016-11-26 ENCOUNTER — Telehealth: Payer: Self-pay

## 2016-11-26 ENCOUNTER — Other Ambulatory Visit: Payer: Self-pay | Admitting: Internal Medicine

## 2016-11-26 DIAGNOSIS — R140 Abdominal distension (gaseous): Secondary | ICD-10-CM

## 2016-11-26 DIAGNOSIS — R110 Nausea: Secondary | ICD-10-CM

## 2016-11-26 DIAGNOSIS — R197 Diarrhea, unspecified: Secondary | ICD-10-CM

## 2016-11-26 NOTE — Telephone Encounter (Signed)
Re ordered

## 2016-11-26 NOTE — Telephone Encounter (Signed)
-----   Message from Eustace Pen sent at 11/26/2016 10:27 AM EDT ----- Regarding: order Graylon Good, Can you change this pt's order for me? Dr. Derrel Nip entered it as gastric emptying solid liquid-they said it needs to be gastric emptying [id number]. Thank you! Melissa

## 2016-12-05 ENCOUNTER — Ambulatory Visit
Admission: RE | Admit: 2016-12-05 | Discharge: 2016-12-05 | Disposition: A | Discharge status: Home / Self Care | Payer: BC Managed Care – PPO | Source: Ambulatory Visit | Attending: Internal Medicine | Admitting: Internal Medicine

## 2016-12-05 DIAGNOSIS — R110 Nausea: Secondary | ICD-10-CM | POA: Diagnosis present

## 2016-12-05 DIAGNOSIS — R197 Diarrhea, unspecified: Secondary | ICD-10-CM | POA: Insufficient documentation

## 2016-12-05 DIAGNOSIS — R140 Abdominal distension (gaseous): Secondary | ICD-10-CM | POA: Insufficient documentation

## 2016-12-05 MED ORDER — TECHNETIUM TC 99M SULFUR COLLOID
2.0000 | Freq: Once | INTRAVENOUS | Status: AC | PRN
  Administered 2016-12-05: 2.17 via ORAL

## 2016-12-07 ENCOUNTER — Encounter: Payer: Self-pay | Admitting: Internal Medicine

## 2016-12-07 ENCOUNTER — Ambulatory Visit (INDEPENDENT_AMBULATORY_CARE_PROVIDER_SITE_OTHER): Payer: BC Managed Care – PPO | Admitting: Internal Medicine

## 2016-12-07 VITALS — BP 110/70 | HR 73 | Temp 97.9°F | Resp 16 | Ht 67.0 in | Wt 206.8 lb

## 2016-12-07 DIAGNOSIS — K5730 Diverticulosis of large intestine without perforation or abscess without bleeding: Secondary | ICD-10-CM | POA: Diagnosis not present

## 2016-12-07 DIAGNOSIS — I10 Essential (primary) hypertension: Secondary | ICD-10-CM

## 2016-12-07 DIAGNOSIS — Z860100 Personal history of colon polyps, unspecified: Secondary | ICD-10-CM

## 2016-12-07 DIAGNOSIS — K638219 Small intestinal bacterial overgrowth, unspecified: Secondary | ICD-10-CM

## 2016-12-07 DIAGNOSIS — E785 Hyperlipidemia, unspecified: Secondary | ICD-10-CM

## 2016-12-07 DIAGNOSIS — E559 Vitamin D deficiency, unspecified: Secondary | ICD-10-CM

## 2016-12-07 DIAGNOSIS — K6389 Other specified diseases of intestine: Secondary | ICD-10-CM | POA: Diagnosis not present

## 2016-12-07 LAB — LIPID PANEL
CHOLESTEROL: 146 mg/dL (ref 0–200)
HDL: 42.3 mg/dL (ref >39.00)
LDL Cholesterol: 90 mg/dL (ref 0–99)
NONHDL: 104.08
Total CHOL/HDL Ratio: 3
Triglycerides: 68 mg/dL (ref 0.0–149.0)
VLDL: 13.6 mg/dL (ref 0.0–40.0)

## 2016-12-07 LAB — COMPREHENSIVE METABOLIC PANEL
ALBUMIN: 4.5 g/dL (ref 3.5–5.2)
ALK PHOS: 57 U/L (ref 39–117)
ALT: 18 U/L (ref 0–53)
AST: 19 U/L (ref 0–37)
BILIRUBIN TOTAL: 0.8 mg/dL (ref 0.2–1.2)
BUN: 15 mg/dL (ref 6–23)
CO2: 30 mEq/L (ref 19–32)
CREATININE: 1.14 mg/dL (ref 0.40–1.50)
Calcium: 10.1 mg/dL (ref 8.4–10.5)
Chloride: 102 mEq/L (ref 96–112)
GFR: 69.8 mL/min (ref >60.00)
GLUCOSE: 109 mg/dL — AB (ref 70–99)
POTASSIUM: 4.3 meq/L (ref 3.5–5.1)
SODIUM: 139 meq/L (ref 135–145)
TOTAL PROTEIN: 7.4 g/dL (ref 6.0–8.3)

## 2016-12-07 LAB — VITAMIN D 25 HYDROXY (VIT D DEFICIENCY, FRACTURES): VITD: 24.46 ng/mL — AB (ref 30.00–100.00)

## 2016-12-07 NOTE — Patient Instructions (Addendum)
The next time you have a flare of the Abdominal pain,  We should get a CT of the abdomen and you should try following a low FodMap  Diet    You may suspend the amlodipine for a week when you are ready and have Seth Doyle recheck your readings after a a week   You can also try the organic apple cider vinegar with "mother"  As a daily tonic for the stomach

## 2016-12-07 NOTE — Progress Notes (Signed)
Subjective:  Patient ID: Seth Doyle, male    DOB: [birth year]  Age: 61 y.o. MRN: [id number]  CC: The primary encounter diagnosis was Hyperlipidemia LDL goal <130. Diagnoses of Vitamin D deficiency, Small intestinal bacterial overgrowth, Diverticulosis of colon without hemorrhage, Personal history of colonic polyps, and Essential hypertension were also pertinent to this visit.  HPI ICARUS PARTCH presents for follow up on recent episode of bloating,  Nausea,  And altered stools pattern : constipation, then diarrhea) which began intially accompanied by moderate to severe joint pain and fatigue during the last week of May. He has a history of SIBO diagnosed by Lucilla Lame which resolved after treatment with Rifaxamin in Dec 2016.  Colonoscopy was done Dec 2016, one  6 mm polyp found and diverticulosis sigmid colon.  History of EGD Jun 18 2014 normal.   Has been using tums for GERD and symptoms have improved.    History of SIBO diagnosed with increased qualitative fecal fat test and normal pancreatic elastase stool test.  reated with Rifaxamin  550 mg bid x 21 days with resolution of symptoms. Repeat fecal fat test in February was normal.    However,  Symptoms did not respond to same treatment on Oct 19 2016 when he was treated empirically for identical symptoms..  Gastric emptying study was normal.  Weight loss of 5 lbs since May noted.   Recently had surgery for Harriet Pho tenodinitis on his  right medial wrist  June 8  After 5 cortisone shots failed to improve symptoms  Marchelle Folks , hand specialist at [facility] Medications Prior to Visit  Medication Sig Dispense Refill  . amLODipine (NORVASC) 2.5 MG tablet Take 1 tablet (2.5 mg total) by mouth daily. 90 tablet 3  . aspirin-acetaminophen-caffeine (EXCEDRIN MIGRAINE) 250-250-65 MG tablet Take 2 tablets by mouth every 6 (six) hours as needed for headache.    . B Complex-C (B-COMPLEX WITH VITAMIN C) tablet Take 1 tablet by mouth  daily.    Marland Kitchen zolpidem (AMBIEN) 10 MG tablet take 1 tablet by mouth at bedtime if needed 30 tablet 5  . cyclobenzaprine (FLEXERIL) 10 MG tablet Take 1 tablet (10 mg total) by mouth 3 (three) times daily as needed for muscle spasms. (Patient not taking: Reported on 12/07/2016) 30 tablet 0  . Dexlansoprazole 30 MG capsule Take 1 capsule (30 mg total) by mouth daily. (Patient not taking: Reported on 12/07/2016) 30 capsule 3  . famotidine (PEPCID) 40 MG tablet Take 40 mg by mouth daily as needed for heartburn or indigestion.    . magnesium oxide (MAG-OX) 400 (241.3 Mg) MG tablet Take 400 mg by mouth daily.    . Multiple Vitamin (MULTIVITAMIN WITH MINERALS) TABS tablet Take 1 tablet by mouth daily.    . rifaximin (XIFAXAN) 550 MG TABS tablet Take 1 tablet (550 mg total) by mouth 2 (two) times daily. (Patient not taking: Reported on 12/07/2016) 42 tablet 0  . tamsulosin (FLOMAX) 0.4 MG CAPS capsule Take 1 capsule (0.4 mg total) by mouth daily. (Patient not taking: Reported on 12/07/2016) 30 capsule 3   No facility-administered medications prior to visit.     Review of Systems;  Patient denies headache, fevers, malaise, , skin rash, eye pain, sinus congestion and sinus pain, sore throat, dysphagia,  hemoptysis , cough, dyspnea, wheezing, chest pain, palpitations, orthopnea, edema, abdominal pain, nausea, melena, diarrhea, constipation, flank pain, dysuria, hematuria, urinary  Frequency, nocturia, numbness, tingling, seizures,  Focal weakness, Loss of  consciousness,  Tremor, insomnia, depression, anxiety, and suicidal ideation.      Objective:  BP 110/70 (BP Location: Left Arm, Cuff Size: Large)   Pulse 73   Temp 97.9 F (36.6 C) (Oral)   Resp 16   Ht 5\' 7"  (1.702 m)   Wt 206 lb 12.8 oz (93.8 kg)   SpO2 97%   BMI 32.39 kg/m   BP Readings from Last 3 Encounters:  12/07/16 110/70  07/02/16 114/80  04/20/16 125/85    Wt Readings from Last 3 Encounters:  12/07/16 206 lb 12.8 oz (93.8 kg)    07/02/16 211 lb (95.7 kg)  04/20/16 219 lb 4 oz (99.5 kg)    General appearance: alert, cooperative and appears stated age Ears: normal TM's and external ear canals both ears Throat: lips, mucosa, and tongue normal; teeth and gums normal Neck: no adenopathy, no carotid bruit, supple, symmetrical, trachea midline and thyroid not enlarged, symmetric, no tenderness/mass/nodules Back: symmetric, no curvature. ROM normal. No CVA tenderness. Lungs: clear to auscultation bilaterally Heart: regular rate and rhythm, S1, S2 normal, no murmur, click, rub or gallop Abdomen: soft, non-tender; bowel sounds normal; no masses,  no organomegaly Pulses: 2+ and symmetric Skin: Skin color, texture, turgor normal. No rashes or lesions Lymph nodes: Cervical, supraclavicular, and axillary nodes normal.  Lab Results  Component Value Date   HGBA1C 5.7 01/06/2016    Lab Results  Component Value Date   CREATININE 1.14 12/07/2016   CREATININE 1.27 07/02/2016   CREATININE 1.41 02/28/2016    Lab Results  Component Value Date   WBC 6.5 07/02/2016   HGB 14.8 07/02/2016   HCT 43.2 07/02/2016   PLT 222.0 07/02/2016   GLUCOSE 109 (H) 12/07/2016   CHOL 146 12/07/2016   TRIG 68.0 12/07/2016   HDL 42.30 12/07/2016   LDLDIRECT 69.0 02/28/2016   LDLCALC 90 12/07/2016   ALT 18 12/07/2016   AST 19 12/07/2016   NA 139 12/07/2016   K 4.3 12/07/2016   CL 102 12/07/2016   CREATININE 1.14 12/07/2016   BUN 15 12/07/2016   CO2 30 12/07/2016   TSH 1.88 02/21/2015   PSA 1.07 03/01/2016   HGBA1C 5.7 01/06/2016   MICROALBUR 0.7 02/28/2016    Nm Gastric Emptying  Result Date: 12/05/2016 CLINICAL DATA:  Nausea, bloating, watery diarrhea, weight loss of 8-10 pounds in 2 weeks, history of reflux esophagitis EXAM: NUCLEAR MEDICINE GASTRIC EMPTYING SCAN TECHNIQUE: After oral ingestion of radiolabeled meal, sequential abdominal images were obtained for 4 hours. Percentage of activity emptying the stomach was  calculated at 1 hour, 2 hour, 3 hour, and 4 hours. RADIOPHARMACEUTICALS:  2.17 mCi Tc-58m sulfur colloid in standardized meal COMPARISON:  02/28/2015 FINDINGS: Expected location of the stomach in the left upper quadrant. Ingested meal empties the stomach gradually over the course of the study. 44% emptied at 1 hr ( normal >= 10%) 72% emptied at 2 hr ( normal >= 40%) 91% emptied at 3 hr ( normal >= 70%) 95% emptied at 4 hr ( normal >= 90%) IMPRESSION: Normal gastric emptying study. When compared to the previous exam, no significant interval change. Electronically Signed   By: Lavonia Dana M.D.   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 Last colonoscopy  Dec 2016      Essential hypertension    Agreewsith suspending  amlodipine for one week ,  bp receh to be done by wife Butch Penny who is an Therapist, sports      Personal history of colonic polyps    6 mm tubular adenoma removed Dec 2016.   Marland Kitchen 5 yr follow up advised.       Small intestinal bacterial overgrowth    Diagnosis presumed by positive fecal fat test and negative pancreatic elastase test.  Symptoms resolved with rifaxamin in 2016 and did not seem to resulve recently with same treatment although he is now tolerating meals and using Tums      Vitamin D deficiency   Relevant Orders   VITAMIN D 25 Hydroxy (Vit-D Deficiency, Fractures) (Completed)    Other Visit Diagnoses    Hyperlipidemia LDL goal <130    -  Primary   Relevant Orders   Lipid panel (Completed)   Comprehensive metabolic panel (Completed)      I have discontinued Mr. Morones's tamsulosin, multivitamin with minerals, magnesium oxide, famotidine, cyclobenzaprine, Dexlansoprazole, and rifaximin. I am also having him maintain his B-complex with vitamin C, aspirin-acetaminophen-caffeine, amLODipine, and zolpidem.  No orders of the defined  types were placed in this encounter.   Medications Discontinued During This Encounter  Medication Reason  . cyclobenzaprine (FLEXERIL) 10 MG tablet Patient has not taken in last 30 days  . Dexlansoprazole 30 MG capsule Patient has not taken in last 30 days  . famotidine (PEPCID) 40 MG tablet Patient has not taken in last 30 days  . magnesium oxide (MAG-OX) 400 (241.3 Mg) MG tablet Patient has not taken in last 30 days  . Multiple Vitamin (MULTIVITAMIN WITH MINERALS) TABS tablet Patient has not taken in last 30 days  . rifaximin (XIFAXAN) 550 MG TABS tablet Patient has not taken in last 30 days  . tamsulosin (FLOMAX) 0.4 MG CAPS capsule Patient has not taken in last 30 days    Follow-up: No Follow-up on file.   Crecencio Mc, MD

## 2016-12-09 ENCOUNTER — Encounter: Payer: Self-pay | Admitting: Internal Medicine

## 2016-12-09 DIAGNOSIS — K6389 Other specified diseases of intestine: Secondary | ICD-10-CM | POA: Insufficient documentation

## 2016-12-09 DIAGNOSIS — K638219 Small intestinal bacterial overgrowth, unspecified: Secondary | ICD-10-CM | POA: Insufficient documentation

## 2016-12-09 HISTORY — DX: Small intestinal bacterial overgrowth, unspecified: K63.8219

## 2016-12-09 NOTE — Assessment & Plan Note (Addendum)
6 mm tubular adenoma removed Dec 2016.   Marland Kitchen 5 yr follow up advised.

## 2016-12-09 NOTE — Assessment & Plan Note (Signed)
Diagnosis presumed by positive fecal fat test and negative pancreatic elastase test.  Symptoms resolved with rifaxamin in 2016 and did not seem to [place] recently with same treatment although he is now tolerating meals and using Tums

## 2016-12-09 NOTE — Assessment & Plan Note (Addendum)
Agreewsith suspending amlodipine for one week ,  bp receh to be done by wife Butch Penny who is an Therapist, sports

## 2016-12-09 NOTE — Assessment & Plan Note (Signed)
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 Last colonoscopy  Dec 2016

## 2017-01-01 ENCOUNTER — Telehealth: Payer: BC Managed Care – PPO | Admitting: Internal Medicine

## 2017-01-01 DIAGNOSIS — B001 Herpesviral vesicular dermatitis: Secondary | ICD-10-CM

## 2017-01-01 MED ORDER — MAGIC MOUTHWASH: 5.0000 mL | Freq: Four times a day (QID) | ORAL | 0 refills | Status: DC

## 2017-01-01 MED ORDER — VALACYCLOVIR HCL 1 G PO TABS: ORAL_TABLET | ORAL | 0 refills | Status: DC

## 2017-01-01 NOTE — Progress Notes (Signed)
We are sorry that you are not feeling well.  Here is how we plan to help!  Based on what you have shared with me it does look like you have a viral infection.    Most cold sores or fever blisters are small fluid filled blisters around the mouth caused by herpes simplex virus.  The most common strain of the virus causing cold sores is herpes simplex virus 1.  It can be spread by skin contact, sharing eating utensils, or even sharing towels.  Cold sores are contagious to other people until dry. (Approximately 5-7 days).  Wash your hands. You can spread the virus to your eyes through handling your contact lenses after touching the lesions.  Most people experience pain at the sight or tingling sensations in their lips that may begin before the ulcers erupt.  Herpes simplex is treatable but not curable.  It may lie dormant for a long time and then reappear due to stress or prolonged sun exposure.  Many patients have success in treating their cold sores with an over the counter topical called Abreva.  You may apply the cream up to 5 times daily (maximum 10 days) until healing occurs.  If you would like to use an oral antiviral medication to speed the healing of your cold sore, I have sent a prescription to your local pharmacy Valacyclovir 2 gm twice daily for 1 day   Magic mouth wash swish and swallow   51ml 4x a day        HOME CARE:   Wash your hands frequently.  Do not pick at or rub the sore.  Don't open the blisters.  Avoid kissing other people during this time.  Avoid sharing drinking glasses, eating utensils, or razors.  Do not handle contact lenses unless you have thoroughly washed your hands with soap and warm water!  Avoid oral sex during this time.  Herpes from sores on your mouth can spread to your partner's genital area.  Avoid contact with anyone who has eczema or a weakened immune system.  Cold sores are often triggered by exposure to intense sunlight, use a lip balm  containing a sunscreen (SPF 30 or higher).  GET HELP RIGHT AWAY IF:   Blisters look infected.  Blisters occur near or in the eye.  Symptoms last longer than 10 days.  Your symptoms become worse.  MAKE SURE YOU:   Understand these instructions.  Will watch your condition.  Will get help right away if you are not doing well or get worse.    Your e-visit answers were reviewed by a board certified advanced clinical practitioner to complete your personal care plan.  Depending upon the condition, your plan could have  Included both over the counter or prescription medications.    Please review your pharmacy choice.  Be sure that the pharmacy you have chosen is open so that you can pick up your prescription now.  If there is a problem you csn message your provider in [place] to have the prescription routed to another pharmacy.    Your safety is important to [place].  If you have drug allergies check our prescription carefully.  For the next 24 hours you can use MyChart to ask questions about today's visit, request a non-urgent call back, or ask for a work or school excuse from your e-visit provider.  You will get an email in the next two days asking about your experience.  I hope that your e-visit has been valuable  and will speed your recovery.

## 2017-01-15 ENCOUNTER — Other Ambulatory Visit: Payer: Self-pay | Admitting: Internal Medicine

## 2017-02-14 NOTE — Telephone Encounter (Signed)
Error

## 2017-03-12 ENCOUNTER — Other Ambulatory Visit: Payer: Self-pay | Admitting: Internal Medicine

## 2017-03-13 NOTE — Telephone Encounter (Signed)
Last OV 12/07/2016 Next OV not scheduled Last refill 02/15/2017

## 2017-03-14 NOTE — Telephone Encounter (Signed)
No problem. Needs appt in January

## 2017-03-14 NOTE — Telephone Encounter (Signed)
LMTCB to schedule a follow up appt with Dr. Derrel Nip in January. Rx has been faxed.

## 2017-04-15 ENCOUNTER — Encounter: Payer: Self-pay | Admitting: Internal Medicine

## 2017-04-15 ENCOUNTER — Other Ambulatory Visit: Payer: Self-pay | Admitting: Internal Medicine

## 2017-04-15 MED ORDER — PREDNISONE 10 MG PO TABS: ORAL_TABLET | ORAL | 0 refills | Status: DC

## 2017-04-15 MED ORDER — AMOXICILLIN-POT CLAVULANATE 875-125 MG PO TABS: 1.0000 | ORAL_TABLET | Freq: Two times a day (BID) | ORAL | 0 refills | Status: DC

## 2017-05-31 ENCOUNTER — Ambulatory Visit: Payer: BC Managed Care – PPO | Admitting: Internal Medicine

## 2017-05-31 ENCOUNTER — Encounter: Payer: Self-pay | Admitting: Internal Medicine

## 2017-05-31 VITALS — BP 140/88 | HR 71 | Temp 98.1°F | Resp 15 | Ht 67.0 in | Wt 210.0 lb

## 2017-05-31 DIAGNOSIS — K760 Fatty (change of) liver, not elsewhere classified: Secondary | ICD-10-CM

## 2017-05-31 DIAGNOSIS — K6389 Other specified diseases of intestine: Secondary | ICD-10-CM | POA: Diagnosis not present

## 2017-05-31 DIAGNOSIS — E559 Vitamin D deficiency, unspecified: Secondary | ICD-10-CM

## 2017-05-31 DIAGNOSIS — M1851 Other unilateral secondary osteoarthritis of first carpometacarpal joint, right hand: Secondary | ICD-10-CM

## 2017-05-31 DIAGNOSIS — I10 Essential (primary) hypertension: Secondary | ICD-10-CM | POA: Diagnosis not present

## 2017-05-31 DIAGNOSIS — F5104 Psychophysiologic insomnia: Secondary | ICD-10-CM

## 2017-05-31 DIAGNOSIS — Z0000 Encounter for general adult medical examination without abnormal findings: Secondary | ICD-10-CM | POA: Diagnosis not present

## 2017-05-31 DIAGNOSIS — Z125 Encounter for screening for malignant neoplasm of prostate: Secondary | ICD-10-CM

## 2017-05-31 MED ORDER — ZOLPIDEM TARTRATE 10 MG PO TABS: ORAL_TABLET | ORAL | 5 refills | Status: DC

## 2017-05-31 NOTE — Patient Instructions (Addendum)
You need more vitamin d while your bones are healing  I will check your level today along with your liver and kidney function    The new goals for optimal blood pressure management are 120/70.  Please check your blood pressure a few times at home and send me the readings so I can determine if you need an increase  in medication    Health Maintenance, Male A healthy lifestyle and preventive care is important for your health and wellness. Ask your health care provider about what schedule of regular examinations is right for you. What should I know about weight and diet? Eat a Healthy Diet  Eat plenty of vegetables, fruits, whole grains, low-fat dairy products, and lean protein.  Do not eat a lot of foods high in solid fats, added sugars, or salt.  Maintain a Healthy Weight Regular exercise can help you achieve or maintain a healthy weight. You should:  Do at least 150 minutes of exercise each week. The exercise should increase your heart rate and make you sweat (moderate-intensity exercise).  Do strength-training exercises at least twice a week.  Watch Your Levels of Cholesterol and Blood Lipids  Have your blood tested for lipids and cholesterol every 5 years starting at 60 years of age. If you are at high risk for heart disease, you should start having your blood tested when you are 60 years old. You may need to have your cholesterol levels checked more often if: ? Your lipid or cholesterol levels are high. ? You are older than 60 years of age. ? You are at high risk for heart disease.  What should I know about cancer screening? Many types of cancers can be detected early and may often be prevented. Lung Cancer  You should be screened every year for lung cancer if: ? You are a current smoker who has smoked for at least 30 years. ? You are a former smoker who has quit within the past 15 years.  Talk to your health care provider about your screening options, when you should start  screening, and how often you should be screened.  Colorectal Cancer  Routine colorectal cancer screening usually begins at 60 years of age and should be repeated every 5-10 years until you are 60 years old. You may need to be screened more often if early forms of precancerous polyps or small growths are found. Your health care provider may recommend screening at an earlier age if you have risk factors for colon cancer.  Your health care provider may recommend using home test kits to check for hidden blood in the stool.  A small camera at the end of a tube can be used to examine your colon (sigmoidoscopy or colonoscopy). This checks for the earliest forms of colorectal cancer.  Prostate and Testicular Cancer  Depending on your age and overall health, your health care provider may do certain tests to screen for prostate and testicular cancer.  Talk to your health care provider about any symptoms or concerns you have about testicular or prostate cancer.  Skin Cancer  Check your skin from head to toe regularly.  Tell your health care provider about any new moles or changes in moles, especially if: ? There is a change in a mole's size, shape, or color. ? You have a mole that is larger than a pencil eraser.  Always use sunscreen. Apply sunscreen liberally and repeat throughout the day.  Protect yourself by wearing long sleeves, pants, a wide-brimmed hat,  and sunglasses when outside.  What should I know about heart disease, diabetes, and high blood pressure?  If you are 27-27 years of age, have your blood pressure checked every 3-5 years. If you are 68 years of age or older, have your blood pressure checked every year. You should have your blood pressure measured twice-once when you are at a hospital or clinic, and once when you are not at a hospital or clinic. Record the average of the two measurements. To check your blood pressure when you are not at a hospital or clinic, you can use: ? An  automated blood pressure machine at a pharmacy. ? A home blood pressure monitor.  Talk to your health care provider about your target blood pressure.  If you are between 25-73 years old, ask your health care provider if you should take aspirin to prevent heart disease.  Have regular diabetes screenings by checking your fasting blood sugar level. ? If you are at a normal weight and have a low risk for diabetes, have this test once every three years after the age of 43. ? If you are overweight and have a high risk for diabetes, consider being tested at a younger age or more often.  A one-time screening for abdominal aortic aneurysm (AAA) by ultrasound is recommended for men aged 61-75 years who are current or former smokers. What should I know about preventing infection? Hepatitis B If you have a higher risk for hepatitis B, you should be screened for this virus. Talk with your health care provider to find out if you are at risk for hepatitis B infection. Hepatitis C Blood testing is recommended for:  Everyone born from [birth year] through 1963.  Anyone with known risk factors for hepatitis C.  Sexually Transmitted Diseases (STDs)  You should be screened each year for STDs including gonorrhea and chlamydia if: ? You are sexually active and are younger than 60 years of age. ? You are older than 60 years of age and your health care provider tells you that you are at risk for this type of infection. ? Your sexual activity has changed since you were last screened and you are at an increased risk for chlamydia or gonorrhea. Ask your health care provider if you are at risk.  Talk with your health care provider about whether you are at high risk of being infected with HIV. Your health care provider may recommend a prescription medicine to help prevent HIV infection.  What else can I do?  Schedule regular health, dental, and eye exams.  Stay current with your vaccines (immunizations).  Do not use  any tobacco products, such as cigarettes, chewing tobacco, and e-cigarettes. If you need help quitting, ask your health care provider.  Limit alcohol intake to no more than 2 drinks per day. One drink equals 12 ounces of beer, 5 ounces of wine, or 1 ounces of hard liquor.  Do not use street drugs.  Do not share needles.  Ask your health care provider for help if you need support or information about quitting drugs.  Tell your health care provider if you often feel depressed.  Tell your health care provider if you have ever been abused or do not feel safe at home. This information is not intended to replace advice given to you by your health care provider. Make sure you discuss any questions you have with your health care provider. Document Released: 11/03/2007 Document Revised: 01/04/2016 Document Reviewed: 02/08/2015 [employer]vier Interactive Patient Education  2018 [employer]  

## 2017-05-31 NOTE — Progress Notes (Signed)
Patient ID: Seth Doyle, male    DOB: 30-Sep-1955  Age: 62 y.o. MRN: [id number]  The patient is here for annual preventive wellness examination and management of other chronic and acute problems.   The risk factors are reflected in the social history.  The roster of all physicians providing medical care to patient - is listed in the Snapshot section of the chart.  Activities of daily living:  The patient is 100% independent in all ADLs: dressing, toileting, feeding as well as independent mobility  Home safety : The patient has smoke detectors in the home. They wear seatbelts.  There are no firearms at home. There is no violence in the home.   There is no risks for hepatitis, STDs or HIV. There is no   history of blood transfusion. They have no travel history to infectious disease endemic areas of the world.  The patient has seen their dentist in the last six month. They have seen their eye doctor in the last year.    They do not  have excessive sun exposure. Discussed the need for sun protection: hats, long sleeves and use of sunscreen if there is significant sun exposure.   Diet: the importance of a healthy diet is discussed. They do have a healthy diet.  The benefits of regular aerobic exercise were discussed. She walks 4 times per week ,  20 minutes.   Depression screen: there are no signs or vegative symptoms of depression- irritability, change in appetite, anhedonia, sadness/tearfullness.  The following portions of the patient's history were reviewed and updated as appropriate: allergies, current medications, past family history, past medical history,  past surgical history, past social history  and problem list.  Visual acuity was not assessed per patient preference since she has regular follow up with her ophthalmologist. Hearing and body mass index were assessed and reviewed.   During the course of the visit the patient was educated and counseled about appropriate screening and  preventive services including : fall prevention , diabetes screening, nutrition counseling, colorectal cancer screening, and recommended immunizations.    CC: The primary encounter diagnosis was Vitamin D deficiency. Diagnoses of Essential hypertension, Prostate cancer screening, Encounter for preventive health examination, Hepatic steatosis, Small intestinal bacterial overgrowth, Other secondary osteoarthritis of first carpometacarpal joint of right hand, and Chronic insomnia were also pertinent to this visit.  FOLLOW UP ON HYPERTENSION and insomnia   He is wearing a cast on his right wrist.  He has had 2 surgeries on right wrist ;  Repeat surgery done to remove  Degenerated carpal bone  In a cast until end of January , did not tolerate oxycodone due to vomiting.  Taking tylenol for pain.    Losing weight   Intentionally   Insomnia: chronic , managed with ambien not used all the time   Home bps 128/79  132/82 using a home machine   History Seth Doyle has a past medical history of Chronic insomnia (03/26/2014), CMC arthritis, thumb, degenerative (03/27/2013), Constipation (02/22/2015), Diverticulitis, Diverticulitis of sigmoid colon (02/04/2011), Essential hypertension (04/27/2014), Hepatic steatosis (02/22/2015), Left rotator cuff tear (10/30/2013), Reflux esophagitis (09/10/2012), Subacromial bursitis (09/18/2013), Urinary retention due to benign prostatic hyperplasia (04/27/2014), Vertigo, and Wears contact lenses.   He has a past surgical history that includes Rhinoplasty (1992); Rotator cuff repair (1991); Shoulder arthroscopy w/ rotator cuff repair (1991); Rhinoplasty (1992); Colon surgery (August 2012); Eye surgery; Colonoscopy with propofol (N/A, 05/06/2015); and polypectomy (05/06/2015).   His family history includes COPD in his father;  Diabetes in his mother; Hyperlipidemia in his father; Mental illness in his father and mother.He reports that  has never smoked. he has never used smokeless tobacco.  He reports that he drinks alcohol. He reports that he does not use drugs.  Outpatient Medications Prior to Visit  Medication Sig Dispense Refill  . amLODipine (NORVASC) 2.5 MG tablet take 1 tablet by mouth once daily 90 tablet 3  . aspirin-acetaminophen-caffeine (EXCEDRIN MIGRAINE) 250-250-65 MG tablet Take 2 tablets by mouth every 6 (six) hours as needed for headache.    . B Complex-C (B-COMPLEX WITH VITAMIN C) tablet Take 1 tablet by mouth daily.    . valACYclovir (VALTREX) 1000 MG tablet 2 po 2x a day for 1 day 4 tablet 0  . zolpidem (AMBIEN) 10 MG tablet take 1 tablet by mouth at bedtime if needed for sleep 30 tablet 3  . amoxicillin-clavulanate (AUGMENTIN) 875-125 MG tablet Take 1 tablet by mouth 2 (two) times daily. (Patient not taking: Reported on 05/31/2017) 14 tablet 0  . magic mouthwash SOLN Take 5 mLs by mouth 4 (four) times daily. (Patient not taking: Reported on 05/31/2017) 100 mL 0  . predniSONE (DELTASONE) 10 MG tablet 6 tablets on Day 1 , then reduce by 1 tablet daily until gone (Patient not taking: Reported on 05/31/2017) 21 tablet 0   No facility-administered medications prior to visit.     Review of Systems   Patient denies headache, fevers, malaise, unintentional weight loss, skin rash, eye pain, sinus congestion and sinus pain, sore throat, dysphagia,  hemoptysis , cough, dyspnea, wheezing, chest pain, palpitations, orthopnea, edema, abdominal pain, nausea, melena, diarrhea, constipation, flank pain, dysuria, hematuria, urinary  Frequency, nocturia, numbness, tingling, seizures,  Focal weakness, Loss of consciousness,  Tremor, insomnia, depression, anxiety, and suicidal ideation.     Objective:  BP 140/88 (BP Location: Left Arm, Patient Position: Sitting, Cuff Size: Normal)   Pulse 71   Temp 98.1 F (36.7 C) (Oral)   Resp 15   Ht 5\' 7"  (1.702 m)   Wt 210 lb (95.3 kg)   SpO2 94%   BMI 32.89 kg/m   Physical Exam   General appearance: alert, cooperative and appears  stated age Ears: normal TM's and external ear canals both ears Throat: lips, mucosa, and tongue normal; teeth and gums normal Neck: no adenopathy, no carotid bruit, supple, symmetrical, trachea midline and thyroid not enlarged, symmetric, no tenderness/mass/nodules Back: symmetric, no curvature. ROM normal. No CVA tenderness. Lungs: clear to auscultation bilaterally Heart: regular rate and rhythm, S1, S2 normal, no murmur, click, rub or gallop Abdomen: soft, non-tender; bowel sounds normal; no masses,  no organomegaly Pulses: 2+ and symmetric Skin: Skin color, texture, turgor normal. No rashes or lesions Lymph nodes: Cervical, supraclavicular, and axillary nodes normal.    Assessment & Plan:   Problem List Items Addressed This Visit    Vitamin D deficiency - Primary   Relevant Orders   VITAMIN D 25 Hydroxy (Vit-D Deficiency, Fractures) (Completed)   Chronic insomnia    Managed with ambien.  Refills given.  Encouraged to use sparingly in favor of non pharmcologic methods of relaxation       CMC arthritis, thumb, degenerative    S/p surgical intervention, second surgery required for removal of degraded metacarpal bone,  Still wearing cast.       Encounter for preventive health examination    Annual comprehensive preventive exam was done as well as an evaluation and management of acute and chronic conditions .  During the course of the visit the patient was educated and counseled about appropriate screening and preventive services including :  diabetes screening, lipid analysis with projected  10 year  risk for CAD , nutrition counseling, prostate and colorectal cancer screening, and recommended immunizations.  Printed recommendations for health maintenance screenings was given.   Lab Results  Component Value Date   PSA 0.7 05/31/2017   PSA 1.07 03/01/2016   PSA 0.74 04/30/2014   Lab Results  Component Value Date   CHOL 146 12/07/2016   HDL 42.30 12/07/2016   LDLCALC 90  12/07/2016   LDLDIRECT 69.0 02/28/2016   TRIG 68.0 12/07/2016   CHOLHDL 3 12/07/2016         Essential hypertension    He has resumed  amlodipine  .  hoem readings are done by his wife who is an Therapist, sports and have been normal  Lab Results  Component Value Date   CREATININE 1.10 05/31/2017   Lab Results  Component Value Date   NA 138 05/31/2017   K 4.0 05/31/2017   CL 106 05/31/2017   CO2 24 05/31/2017   Lab Results  Component Value Date   MICROALBUR 0.7 02/28/2016         Relevant Orders   Comprehensive metabolic panel (Completed)   Hepatic steatosis    Found in the setting of rapid wt loss of 25 lbs,  By CT scan, Considered to be due to rapid weight loss per GI .. currently his Liver enzymes are normal.   Lab Results  Component Value Date   ALT 15 05/31/2017   AST 19 05/31/2017   ALKPHOS 57 12/07/2016   BILITOT 0.6 05/31/2017          Small intestinal bacterial overgrowth    He has not had recurrent symptoms since his second episode that resolved spontaneously       Other Visit Diagnoses    Prostate cancer screening       Relevant Orders   PSA (Completed)      I have discontinued [employer] magic mouthwash, amoxicillin-clavulanate, and predniSONE. I am also having him maintain his B-complex with vitamin C, aspirin-acetaminophen-caffeine, valACYclovir, amLODipine, and zolpidem.  Meds ordered this encounter  Medications  . zolpidem (AMBIEN) 10 MG tablet    Sig: take 1 tablet by mouth at bedtime if needed for sleep    Dispense:  30 tablet    Refill:  5    Medications Discontinued During This Encounter  Medication Reason  . amoxicillin-clavulanate (AUGMENTIN) 875-125 MG tablet Completed Course  . magic mouthwash SOLN Completed Course  . predniSONE (DELTASONE) 10 MG tablet Completed Course  . zolpidem (AMBIEN) 10 MG tablet Reorder    Follow-up: No Follow-up on file.   Crecencio Mc, MD

## 2017-06-01 LAB — COMPREHENSIVE METABOLIC PANEL
AG RATIO: 1.8 (calc) (ref 1.0–2.5)
ALT: 15 U/L (ref 9–46)
AST: 19 U/L (ref 10–35)
Albumin: 4.3 g/dL (ref 3.6–5.1)
Alkaline phosphatase (APISO): 62 U/L (ref 40–115)
BUN: 22 mg/dL (ref 7–25)
CALCIUM: 9.4 mg/dL (ref 8.6–10.3)
CO2: 24 mmol/L (ref 20–32)
Chloride: 106 mmol/L (ref 98–110)
Creat: 1.1 mg/dL (ref 0.70–1.33)
GLUCOSE: 89 mg/dL (ref 65–99)
Globulin: 2.4 g/dL (calc) (ref 1.9–3.7)
Potassium: 4 mmol/L (ref 3.5–5.3)
Sodium: 138 mmol/L (ref 135–146)
Total Bilirubin: 0.6 mg/dL (ref 0.2–1.2)
Total Protein: 6.7 g/dL (ref 6.1–8.1)

## 2017-06-01 LAB — VITAMIN D 25 HYDROXY (VIT D DEFICIENCY, FRACTURES): VIT D 25 HYDROXY: 27 ng/mL — AB (ref 30–100)

## 2017-06-01 LAB — PSA: PSA: 0.7 ng/mL (ref <=4.0)

## 2017-06-02 NOTE — Assessment & Plan Note (Signed)
S/p surgical intervention, second surgery required for removal of degraded metacarpal bone,  Still wearing cast.

## 2017-06-02 NOTE — Assessment & Plan Note (Signed)
He has resumed  amlodipine  .  hoem readings are done by his wife who is an Therapist, sports and have been normal  Lab Results  Component Value Date   CREATININE 1.10 05/31/2017   Lab Results  Component Value Date   NA 138 05/31/2017   K 4.0 05/31/2017   CL 106 05/31/2017   CO2 24 05/31/2017   Lab Results  Component Value Date   MICROALBUR 0.7 02/28/2016

## 2017-06-02 NOTE — Assessment & Plan Note (Signed)
He has not had recurrent symptoms since his second episode that resolved spontaneously 

## 2017-06-02 NOTE — Assessment & Plan Note (Signed)
Found in the setting of rapid wt loss of 25 lbs,  By CT scan, Considered to be due to rapid weight loss per GI .. currently his Liver enzymes are normal.   Lab Results  Component Value Date   ALT 15 05/31/2017   AST 19 05/31/2017   ALKPHOS 57 12/07/2016   BILITOT 0.6 05/31/2017

## 2017-06-02 NOTE — Assessment & Plan Note (Signed)
Managed with ambien.  Refills given.  Encouraged to use sparingly in favor of non pharmcologic methods of relaxation

## 2017-06-02 NOTE — Assessment & Plan Note (Signed)
Annual comprehensive preventive exam was done as well as an evaluation and management of acute and chronic conditions .  During the course of the visit the patient was educated and counseled about appropriate screening and preventive services including :  diabetes screening, lipid analysis with projected  10 year  risk for CAD , nutrition counseling, prostate and colorectal cancer screening, and recommended immunizations.  Printed recommendations for health maintenance screenings was given.   Lab Results  Component Value Date   PSA 0.7 05/31/2017   PSA 1.07 03/01/2016   PSA 0.74 04/30/2014   Lab Results  Component Value Date   CHOL 146 12/07/2016   HDL 42.30 12/07/2016   LDLCALC 90 12/07/2016   LDLDIRECT 69.0 02/28/2016   TRIG 68.0 12/07/2016   CHOLHDL 3 12/07/2016

## 2017-06-20 ENCOUNTER — Encounter: Payer: Self-pay | Admitting: Internal Medicine

## 2017-07-07 ENCOUNTER — Other Ambulatory Visit: Payer: Self-pay

## 2017-07-07 ENCOUNTER — Encounter: Payer: Self-pay | Admitting: *Deleted

## 2017-07-07 ENCOUNTER — Emergency Department
Admission: EM | Admit: 2017-07-07 | Discharge: 2017-07-07 | Disposition: A | Discharge status: Home / Self Care | Payer: BC Managed Care – PPO | Attending: Emergency Medicine | Admitting: Emergency Medicine

## 2017-07-07 DIAGNOSIS — R1110 Vomiting, unspecified: Secondary | ICD-10-CM | POA: Diagnosis present

## 2017-07-07 DIAGNOSIS — R05 Cough: Secondary | ICD-10-CM | POA: Diagnosis not present

## 2017-07-07 DIAGNOSIS — K2951 Unspecified chronic gastritis with bleeding: Secondary | ICD-10-CM | POA: Insufficient documentation

## 2017-07-07 DIAGNOSIS — R1013 Epigastric pain: Secondary | ICD-10-CM | POA: Insufficient documentation

## 2017-07-07 LAB — CBC WITH DIFFERENTIAL/PLATELET
BASOS ABS: 0 10*3/uL (ref 0–0.1)
Basophils Relative: 0 %
EOS ABS: 0.2 10*3/uL (ref 0–0.7)
EOS PCT: 2 %
HCT: 39.3 % — ABNORMAL LOW (ref 40.0–52.0)
Hemoglobin: 13.5 g/dL (ref 13.0–18.0)
LYMPHS ABS: 1.5 10*3/uL (ref 1.0–3.6)
LYMPHS PCT: 17 %
MCH: 30.1 pg (ref 26.0–34.0)
MCHC: 34.4 g/dL (ref 32.0–36.0)
MCV: 87.5 fL (ref 80.0–100.0)
MONO ABS: 0.8 10*3/uL (ref 0.2–1.0)
Monocytes Relative: 9 %
Neutro Abs: 6.4 10*3/uL (ref 1.4–6.5)
Neutrophils Relative %: 72 %
PLATELETS: 198 10*3/uL (ref 150–440)
RBC: 4.49 MIL/uL (ref 4.40–5.90)
RDW: 12.4 % (ref 11.5–14.5)
WBC: 8.9 10*3/uL (ref 3.8–10.6)

## 2017-07-07 LAB — URINALYSIS, COMPLETE (UACMP) WITH MICROSCOPIC
BILIRUBIN URINE: NEGATIVE
Bacteria, UA: NONE SEEN
GLUCOSE, UA: NEGATIVE mg/dL
HGB URINE DIPSTICK: NEGATIVE
Ketones, ur: NEGATIVE mg/dL
LEUKOCYTES UA: NEGATIVE
NITRITE: NEGATIVE
PROTEIN: NEGATIVE mg/dL
RBC / HPF: NONE SEEN RBC/hpf (ref 0–5)
Specific Gravity, Urine: 1.013 (ref 1.005–1.030)
Squamous Epithelial / LPF: NONE SEEN
pH: 7 (ref 5.0–8.0)

## 2017-07-07 LAB — COMPREHENSIVE METABOLIC PANEL
ALT: 38 U/L (ref 17–63)
ANION GAP: 10 (ref 5–15)
AST: 37 U/L (ref 15–41)
Albumin: 4.3 g/dL (ref 3.5–5.0)
Alkaline Phosphatase: 68 U/L (ref 38–126)
BUN: 16 mg/dL (ref 6–20)
CALCIUM: 9.2 mg/dL (ref 8.9–10.3)
CO2: 28 mmol/L (ref 22–32)
Chloride: 101 mmol/L (ref 101–111)
Creatinine, Ser: 1.11 mg/dL (ref 0.61–1.24)
Glucose, Bld: 88 mg/dL (ref 65–99)
Potassium: 3.6 mmol/L (ref 3.5–5.1)
Sodium: 139 mmol/L (ref 135–145)
TOTAL PROTEIN: 7.8 g/dL (ref 6.5–8.1)
Total Bilirubin: 1 mg/dL (ref 0.3–1.2)

## 2017-07-07 LAB — CBC
HCT: 43.3 % (ref 40.0–52.0)
Hemoglobin: 14.9 g/dL (ref 13.0–18.0)
MCH: 30.1 pg (ref 26.0–34.0)
MCHC: 34.5 g/dL (ref 32.0–36.0)
MCV: 87.3 fL (ref 80.0–100.0)
Platelets: 218 10*3/uL (ref 150–440)
RBC: 4.95 MIL/uL (ref 4.40–5.90)
RDW: 12.5 % (ref 11.5–14.5)
WBC: 11.2 10*3/uL — AB (ref 3.8–10.6)

## 2017-07-07 LAB — LIPASE, BLOOD: Lipase: 22 U/L (ref 11–51)

## 2017-07-07 MED ORDER — ONDANSETRON 4 MG PO TBDP: 4.0000 mg | ORAL_TABLET | Freq: Once | ORAL | Status: DC | PRN

## 2017-07-07 MED ORDER — SODIUM CHLORIDE 0.9 % IV SOLN
80.0000 mg | Freq: Once | INTRAVENOUS | Status: AC
  Administered 2017-07-07: 80 mg via INTRAVENOUS
  Filled 2017-07-07: qty 80

## 2017-07-07 MED ORDER — SODIUM CHLORIDE 0.9 % IV BOLUS (SEPSIS): 1000.0000 mL | Freq: Once | INTRAVENOUS | Status: DC

## 2017-07-07 NOTE — ED Provider Notes (Addendum)
[facility] Emergency Department Provider Note ____________________________________________   First MD Initiated Contact with Patient 07/07/17 1817     (approximate)  I have reviewed the triage vital signs and the nursing notes.   HISTORY  Chief Complaint Emesis and Cough    HPI Seth Doyle is a 62 y.o. male with past medical history as noted below including prior history of GERD  (no prior history of GI bleed) who presents with vomiting for 1 day, acute onset yesterday, and associated with black or dark brown coffee-ground type material during 2 episodes today.  Patient denies any bright red blood in the vomit, and denies any dark or bloody stools.  He reports epigastric abdominal discomfort and nausea, but no significant pain at this time, no chest pain or difficulty breathing, no fevers, and no diarrhea.  He denies any prior history of similar material when he vomits.  He reports feeling discomfort after eating similar to prior reflux and gastritis for the last few days.  Past Medical History:  Diagnosis Date  . Chronic insomnia 03/26/2014   Managed with generic ambien.     . 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 Constipation 02/22/2015  . Diverticulitis   . Diverticulitis of sigmoid colon 02/04/2011  . Essential hypertension 04/27/2014   related to event. no longer on medicines.  . Hepatic steatosis 02/22/2015  . Left rotator cuff tear 10/30/2013  . Reflux esophagitis 09/10/2012  . Subacromial bursitis 09/18/2013  . Urinary retention due to benign prostatic hyperplasia 04/27/2014  . Vertigo    3 episodes.  last one approx 6 mos ago  . Wears contact lenses     Patient Active Problem List   Diagnosis Date Noted  . Small intestinal bacterial overgrowth 12/09/2016  . Primary hypogonadism in male 07/03/2015  . Personal history of colonic polyps   . Intestinal bypass or anastomosis status   . Benign neoplasm of transverse colon   . Hepatic  steatosis 02/22/2015  . 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 Vitamin D deficiency 05/02/2014  . Urinary retention due to benign prostatic hyperplasia 04/27/2014  . Essential hypertension 04/27/2014  . Encounter for preventive health examination 04/27/2014  . Chronic insomnia 03/26/2014  . Left rotator cuff tear 10/30/2013  . Subacromial bursitis 09/18/2013  . 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 Reflux esophagitis 09/10/2012  . Diverticulosis of colon without hemorrhage 02/04/2011    Past Surgical History:  Procedure Laterality Date  . COLON SURGERY  August 2012   sigmoid colectomy, diverticulitis  . COLONOSCOPY WITH PROPOFOL N/A 05/06/2015   Procedure: COLONOSCOPY WITH PROPOFOL;  Surgeon: Lucilla Lame, MD;  Location: [facility];  Service: Endoscopy;  Laterality: N/A;  . EYE SURGERY     tear duct repair   . POLYPECTOMY  05/06/2015   Procedure: POLYPECTOMY;  Surgeon: Lucilla Lame, MD;  Location: [facility];  Service: Endoscopy;;  . RHINOPLASTY  1992   deviated septum due to volleyball  . RHINOPLASTY  1992   foe deviated septm, traumatic  [place]   . ROTATOR CUFF REPAIR  1991   Dr. Marry Guan  . SHOULDER ARTHROSCOPY W/ ROTATOR CUFF REPAIR  1991   [place]    Prior to Admission medications   Medication Sig Start Date End Date Taking? Authorizing Provider  amLODipine (NORVASC) 2.5 MG tablet take 1 tablet by mouth once daily 01/16/17   Crecencio Mc, MD  aspirin-acetaminophen-caffeine (EXCEDRIN MIGRAINE) [phone] MG tablet Take 2 tablets by mouth every 6 (six)  hours as needed for headache.    [provider]  B Complex-C (B-COMPLEX WITH VITAMIN C) tablet Take 1 tablet by mouth daily.    [provider]  valACYclovir (VALTREX) 1000 MG tablet 2 po 2x a day for 1 day 01/01/17   Chevis Pretty, FNP  zolpidem (AMBIEN) 10 MG tablet take 1 tablet by mouth at bedtime if needed for sleep 05/31/17   Crecencio Mc, MD     Allergies Wellbutrin [bupropion]  Family History  Problem Relation Age of Onset  . Diabetes Mother   . Mental illness Mother        Dementia  . COPD Father   . Hyperlipidemia Father   . Mental illness Father        Parkinson's Disease    Social History Social History   Tobacco Use  . Smoking status: Never Smoker  . Smokeless tobacco: Never Used  Substance Use Topics  . Alcohol use: Yes    Comment: rare - once /year  . Drug use: No    Review of Systems  Constitutional: No fever. Eyes: No redness. ENT: Positive for sour/acid taste in throat. Cardiovascular: Denies chest pain. Respiratory: Denies shortness of breath. Gastrointestinal: Positive for nausea and vomiting.  Genitourinary: Negative for dysuria.  Musculoskeletal: Negative for back pain. Skin: Negative for rash. Neurological: Negative for headache.   ____________________________________________   PHYSICAL EXAM:  VITAL SIGNS: ED Triage Vitals  Enc Vitals Group     BP 07/07/17 1558 121/74     Pulse Rate 07/07/17 1558 (!) 107     Resp 07/07/17 1558 20     Temp 07/07/17 1558 99.4 F (37.4 C)     Temp Source 07/07/17 1558 Oral     SpO2 07/07/17 1558 94 %     Weight 07/07/17 1605 199 lb (90.3 kg)     Height 07/07/17 1605 5\' 11"  (1.803 m)     Head Circumference --      Peak Flow --      Pain Score 07/07/17 1604 5     Pain Loc --      Pain Edu? --      Excl. in [place]? --     Constitutional: Alert and oriented.  L to be comfortable appearing and in no acute distress. Eyes: Conjunctivae are normal.  No pallor. Head: Atraumatic. Nose: No congestion/rhinnorhea. Mouth/Throat: Mucous membranes are moist.   Neck: Normal range of motion.  Cardiovascular: Normal rate, regular rhythm. Grossly normal heart sounds.  Good peripheral circulation. Respiratory: Normal respiratory effort.  No retractions. Lungs CTAB. Gastrointestinal: Soft and nontender. No distention.  Guaiac negative brown stool on  DRE. Genitourinary: No flank tenderness. Musculoskeletal: Extremities warm and well perfused.  Neurologic:  Normal speech and language. No gross focal neurologic deficits are appreciated.  Skin:  Skin is warm and dry. No rash noted. Psychiatric: Mood and affect are normal. Speech and behavior are normal.  ____________________________________________   LABS (all labs ordered are listed, but only abnormal results are displayed)  Labs Reviewed  CBC - Abnormal; Notable for the following components:      Result Value   WBC 11.2 (*)    All other components within normal limits  URINALYSIS, COMPLETE (UACMP) WITH MICROSCOPIC - Abnormal; Notable for the following components:   Color, Urine YELLOW (*)    APPearance CLEAR (*)    All other components within normal limits  CBC WITH DIFFERENTIAL/PLATELET - Abnormal; Notable for the following components:   HCT 39.3 (*)  All other components within normal limits  LIPASE, BLOOD  COMPREHENSIVE METABOLIC PANEL   ____________________________________________  EKG  ED ECG REPORT I, Arta Silence, the attending physician, personally viewed and interpreted this ECG.  Date: 07/29/2017 EKG Time: 1622 Rate: 104 Rhythm: Sinus tachycardia QRS Axis: normal Intervals: normal ST/T Wave abnormalities: Nonspecific T wave inversions anteriorly and flattening laterally Narrative Interpretation: nonspecific changes when compared to EKG of 01/29/2015  ____________________________________________  RADIOLOGY   ____________________________________________   PROCEDURES  Procedure(s) performed: No  Procedures  Critical Care performed: No ____________________________________________   INITIAL IMPRESSION / ASSESSMENT AND PLAN / ED COURSE  Pertinent labs & imaging results that were available during my care of the patient were reviewed by me and considered in my medical decision making (see chart for details).  62 year old male with history  of GERD/gastritis on PPIs presents with GERD-like symptoms of sour taste in his mouth and belching after meals for the last several days, with vomiting over the last day and 2 episodes of vomiting with coffee-ground type material.  No frank blood.  Past medical records reviewed in Epic; patient had endoscopy in 2016 which revealed hiatal hernia and gastritis.  No recent ED visits or hospital admissions.  On exam, the patient is relatively well-appearing, vital signs are stable, and the exam is as described.  Abdomen is soft and nontender.  Patient has not had no further vomiting in the ED.  Stool is brown and guaiac negative.  Presentation is most consistent with gastritis versus PUD, versus GERD. There is no evidence by presentation or exam for pulmonary or cardiac cause of pt's symptoms.   Low suspicion for active GI bleed given no bloody emesis, stable vital signs, and reassuring initial CBC.  Plan: IV PPI, repeat CBC in 4 hours, and GI consult.    ----------------------------------------- 8:56 PM on 07/07/2017 -----------------------------------------  Repeat hemoglobin is stable.  Patient's other lab workup is unremarkable.  His vital signs also have remained stable throughout the ED, and patient has been asleep for most of his ED visit with no further vomiting and resolved nausea.  When I awoke him to reassess, he states that he feels comfortable.  I consulted Dr. Vicente Males from GI.  He advises that based on the patient's presentation and workup, that admission is not initially indicated although if the patient preferred to stay in the hospital he would be happy to evaluate the patient in the morning.  I discussed risks, benefits, and alternatives with the patient, and the patient would strongly prefer to go home.  He states that he feels much better and will call Dr. Dorothey Baseman office in the morning to arrange for close follow-up.  I discussed return precautions with the patient, and he and his wife  expressed understanding.  Patient has nausea medicine as well as his normal antacid medications at home.  ____________________________________________   FINAL CLINICAL IMPRESSION(S) / ED DIAGNOSES  Final diagnoses:  Chronic gastritis with bleeding, unspecified gastritis type      NEW MEDICATIONS STARTED DURING THIS VISIT:  Discharge Medication List as of 07/07/2017  8:59 PM       Note:  This document was prepared using Dragon voice recognition software and may include unintentional dictation errors.       Arta Silence, MD 07/07/17 2100    Arta Silence, MD 07/29/17 1513

## 2017-07-07 NOTE — ED Triage Notes (Signed)
Pt reports acid reflux and vomiting. Pt reports vomiting coffee-ground emesis. Pt denies anticoagulation and hx of GI bleeding. Pt A&O x 4 and ambulatory. Pt in no observable respiratory distress at this time. Pt denies diarrhea. Pt endorses vomiting intermittently x 7 days. Pt states he feels full after eating a small amount. Pt does have GI hx and has been worked up by PCP in the past. Pt has hx of bacterial infection in small intestine.

## 2017-07-07 NOTE — ED Notes (Signed)
Pharmacy called and will send up drip medication once mixed

## 2017-07-07 NOTE — ED Notes (Signed)
esign not working pt verbalized discharge instructions and has no questions at this time 

## 2017-07-07 NOTE — Discharge Instructions (Signed)
Take your regular acid blocking medications as prescribed, and you can take the Zofran you have at home for nausea.  Call Dr. Dorothey Baseman office in the morning to arrange for follow-up within the next several days.  Return to the emergency department immediately for new, worsening, or recurrent blood in the vomit, blood in the stool or dark stools, abdominal pain, weakness, lightheadedness, chest pain, or any other new or worsening symptoms that concern you.

## 2017-07-25 ENCOUNTER — Ambulatory Visit: Payer: BC Managed Care – PPO | Admitting: Gastroenterology

## 2017-08-04 ENCOUNTER — Other Ambulatory Visit: Payer: Self-pay | Admitting: Internal Medicine

## 2017-08-05 NOTE — Telephone Encounter (Signed)
Refilled: 05/31/2017 Last OV: 05/31/2017 Next OV: not scheduled

## 2017-08-06 NOTE — Telephone Encounter (Signed)
Printed, signed and faxed.  

## 2017-09-19 ENCOUNTER — Ambulatory Visit: Payer: BC Managed Care – PPO | Admitting: Internal Medicine

## 2017-09-20 ENCOUNTER — Ambulatory Visit (INDEPENDENT_AMBULATORY_CARE_PROVIDER_SITE_OTHER): Payer: BC Managed Care – PPO

## 2017-09-20 ENCOUNTER — Ambulatory Visit: Payer: BC Managed Care – PPO | Admitting: Internal Medicine

## 2017-09-20 ENCOUNTER — Encounter: Payer: Self-pay | Admitting: Internal Medicine

## 2017-09-20 VITALS — BP 126/82 | HR 73 | Temp 97.9°F | Resp 15 | Ht 71.0 in | Wt 216.8 lb

## 2017-09-20 DIAGNOSIS — K2100 Gastro-esophageal reflux disease with esophagitis, without bleeding: Secondary | ICD-10-CM

## 2017-09-20 DIAGNOSIS — K5901 Slow transit constipation: Secondary | ICD-10-CM | POA: Diagnosis not present

## 2017-09-20 DIAGNOSIS — R1084 Generalized abdominal pain: Secondary | ICD-10-CM | POA: Diagnosis not present

## 2017-09-20 DIAGNOSIS — K760 Fatty (change of) liver, not elsewhere classified: Secondary | ICD-10-CM

## 2017-09-20 DIAGNOSIS — R1013 Epigastric pain: Secondary | ICD-10-CM

## 2017-09-20 DIAGNOSIS — R7301 Impaired fasting glucose: Secondary | ICD-10-CM

## 2017-09-20 DIAGNOSIS — M1851 Other unilateral secondary osteoarthritis of first carpometacarpal joint, right hand: Secondary | ICD-10-CM

## 2017-09-20 LAB — COMPREHENSIVE METABOLIC PANEL
ALT: 18 U/L (ref 0–53)
AST: 22 U/L (ref 0–37)
Albumin: 4.2 g/dL (ref 3.5–5.2)
Alkaline Phosphatase: 69 U/L (ref 39–117)
BUN: 13 mg/dL (ref 6–23)
CHLORIDE: 103 meq/L (ref 96–112)
CO2: 30 meq/L (ref 19–32)
Calcium: 9.5 mg/dL (ref 8.4–10.5)
Creatinine, Ser: 1.14 mg/dL (ref 0.40–1.50)
GFR: 69.61 mL/min (ref >60.00)
GLUCOSE: 72 mg/dL (ref 70–99)
POTASSIUM: 3.9 meq/L (ref 3.5–5.1)
SODIUM: 140 meq/L (ref 135–145)
Total Bilirubin: 0.5 mg/dL (ref 0.2–1.2)
Total Protein: 7.3 g/dL (ref 6.0–8.3)

## 2017-09-20 LAB — CBC WITH DIFFERENTIAL/PLATELET
Basophils Absolute: 0 10*3/uL (ref 0.0–0.1)
Basophils Relative: 0.2 % (ref 0.0–3.0)
EOS PCT: 2.3 % (ref 0.0–5.0)
Eosinophils Absolute: 0.1 10*3/uL (ref 0.0–0.7)
HCT: 42.9 % (ref 39.0–52.0)
Hemoglobin: 14.6 g/dL (ref 13.0–17.0)
LYMPHS ABS: 1.7 10*3/uL (ref 0.7–4.0)
Lymphocytes Relative: 25.4 % (ref 12.0–46.0)
MCHC: 34 g/dL (ref 30.0–36.0)
MCV: 87.2 fl (ref 78.0–100.0)
MONOS PCT: 13 % — AB (ref 3.0–12.0)
Monocytes Absolute: 0.8 10*3/uL (ref 0.1–1.0)
NEUTROS ABS: 3.9 10*3/uL (ref 1.4–7.7)
NEUTROS PCT: 59.1 % (ref 43.0–77.0)
PLATELETS: 249 10*3/uL (ref 150.0–400.0)
RBC: 4.92 Mil/uL (ref 4.22–5.81)
RDW: 12.8 % (ref 11.5–15.5)
WBC: 6.5 10*3/uL (ref 4.0–10.5)

## 2017-09-20 LAB — TSH: TSH: 1.02 u[IU]/mL (ref 0.35–4.50)

## 2017-09-20 LAB — HEMOGLOBIN A1C: Hgb A1c MFr Bld: 5.7 % (ref 4.6–6.5)

## 2017-09-20 LAB — MAGNESIUM: Magnesium: 2.2 mg/dL (ref 1.5–2.5)

## 2017-09-20 MED ORDER — DEXLANSOPRAZOLE 60 MG PO CPDR: 60.0000 mg | DELAYED_RELEASE_CAPSULE | Freq: Every day | ORAL | 1 refills | Status: DC

## 2017-09-20 NOTE — Patient Instructions (Addendum)
For the GERD /esophagitis:  No eating 2 hours before bedtime  Get the wedge to sleep on  Increase Nexium to twice daily.  Take evenign dose at least one hour before bedtime  Trial of Linzess if no improvement in constipation using miralax     Start using miralax EVERY SINGLE DAY  Increase free water intake to 84 ounces DAILY  CLEAN  UP YOUR DIET!  Consider a daily dose of organic apple cider vinegar "with the mother"    Start Dexilant if no improvement in 2 weeks

## 2017-09-20 NOTE — Progress Notes (Signed)
error 

## 2017-09-20 NOTE — Progress Notes (Signed)
Subjective:  Patient ID: Seth Doyle, male    DOB: 1956/04/03  Age: 62 y.o. MRN: [id number]  CC: The primary encounter diagnosis was Slow transit constipation. Diagnoses of Hepatic steatosis, Generalized abdominal pain, Impaired fasting glucose, Other secondary osteoarthritis of first carpometacarpal joint of right hand, Reflux esophagitis, and Intermittent epigastric abdominal pain were also pertinent to this visit.  HPI ALGER KERSTEIN presents for recurrent GI complaints  Treated in ED on Feb 17 for post prandial abdominal pain attributed to gastritis with vomiting of coffee ground material.  Vomiting had stopped by time of ED eval and stool was guiaic negative . Given IV PPI. CBC  Was normal,  rechecked after hydration and did not drop significantly so he was discharged home with an outpatient  GI consult ,  rx for nausea meds and antacids . Patient cancelled appt with Dr Allen Norris on  March 7 because symptoms had completely resolved by time of appointment   He has a History of gastritis treated with dexilant with good results,  But prolonged use resulted in small bowel overgrowth so his PPI was switched to nexium .  He has had 4 or 5 episodes since the change in therapy of nocturnal esophagitis resulting in emesis of foamy material .  He has been taking nexium daily at bedtime .  Not sleeping on a wedge .  Eating badly,  Including junk food. Often within two hours of bedtime.  The episodes of esophagitis coincide with a change in bowel habits and is accompanied by constipation and abdominal pain.  They are often accompanied by nausea but not always resulting in emesis. His normal bowel pattern is two formed stools daily ,  The episodes are cyclic and resolve spontaneously after 3-5 days .  No unintentional weight loss . 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  Outpatient Medications Prior to Visit  Medication Sig Dispense Refill  .  amLODipine (NORVASC) 2.5 MG tablet take 1 tablet by mouth once daily 90 tablet 3  . aspirin-acetaminophen-caffeine (EXCEDRIN MIGRAINE) 250-250-65 MG tablet Take 2 tablets by mouth every 6 (six) hours as needed for headache.    . B Complex-C (B-COMPLEX WITH VITAMIN C) tablet Take 1 tablet by mouth daily.    Marland Kitchen zolpidem (AMBIEN) 10 MG tablet TAKE 1 TABLET BY MOUTH EVERY NIGHT AT BEDTIME 30 tablet 2  . valACYclovir (VALTREX) 1000 MG tablet 2 po 2x a day for 1 day (Patient not taking: Reported on 09/20/2017) 4 tablet 0   No facility-administered medications prior to visit.     Review of Systems;  Patient denies headache, fevers, malaise, unintentional weight loss, skin rash, eye pain, sinus congestion and sinus pain, sore throat, dysphagia,  hemoptysis , cough, dyspnea, wheezing, chest pain, palpitations, orthopnea, edema, , melena, diarrhea, , flank pain, dysuria, hematuria, urinary  Frequency, nocturia, numbness, tingling, seizures,  Focal weakness, Loss of consciousness,  Tremor, insomnia, depression, anxiety, and suicidal ideation.      Objective:  BP 126/82 (BP Location: Left Arm, Patient Position: Sitting, Cuff Size: Large)   Pulse 73   Temp 97.9 F (36.6 C)   Resp 15   Ht 5\' 11"  (1.803 m)   Wt 216 lb 12.8 oz (98.3 kg)   SpO2 97%   BMI 30.24 kg/m   BP Readings from Last 3 Encounters:  09/20/17 126/82  07/07/17 102/61  05/31/17 140/88    Wt Readings from Last 3 Encounters:  09/20/17 216 lb 12.8 oz (98.3 kg)  07/07/17 199 lb (90.3 kg)  05/31/17 210 lb (95.3 kg)    General appearance: alert, cooperative and appears stated age Ears: normal TM's and external ear canals both ears Throat: lips, mucosa, and tongue normal; teeth and gums normal Neck: no adenopathy, no carotid bruit, supple, symmetrical, trachea midline and thyroid not enlarged, symmetric, no tenderness/mass/nodules Back: symmetric, no curvature. ROM normal. No CVA tenderness. Lungs: clear to auscultation  bilaterally Heart: regular rate and rhythm, S1, S2 normal, no murmur, click, rub or gallop Abdomen: soft, non-tender; bowel sounds normal; no masses,  no organomegaly. Ventral surgical scar, well healed.  Pulses: 2+ and symmetric Skin: Skin color, texture, turgor normal. No rashes or lesions Lymph nodes: Cervical, supraclavicular, and axillary nodes normal.  Lab Results  Component Value Date   HGBA1C 5.7 09/20/2017   HGBA1C 5.7 01/06/2016    Lab Results  Component Value Date   CREATININE 1.14 09/20/2017   CREATININE 1.11 07/07/2017   CREATININE 1.10 05/31/2017    Lab Results  Component Value Date   WBC 6.5 09/20/2017   HGB 14.6 09/20/2017   HCT 42.9 09/20/2017   PLT 249.0 09/20/2017   GLUCOSE 72 09/20/2017   CHOL 146 12/07/2016   TRIG 68.0 12/07/2016   HDL 42.30 12/07/2016   LDLDIRECT 69.0 02/28/2016   LDLCALC 90 12/07/2016   ALT 18 09/20/2017   AST 22 09/20/2017   NA 140 09/20/2017   K 3.9 09/20/2017   CL 103 09/20/2017   CREATININE 1.14 09/20/2017   BUN 13 09/20/2017   CO2 30 09/20/2017   TSH 1.02 09/20/2017   PSA 0.7 05/31/2017   HGBA1C 5.7 09/20/2017   MICROALBUR 0.7 02/28/2016    No results found.  Assessment & Plan:   Problem List Items Addressed This Visit    Reflux esophagitis    Advised to take nexium bid,  Follow a diet for GERD,  Avoid eating within 2 hours of bedtime, and use a wedge.  If symptoms persistent resume Dexilant  vs GI evaluation .Last endo was in 2016 and noted gastritis and small hiatal hernia.       Intermittent epigastric abdominal pain    unclear etiology as it has currently resolved.  History suggestive of recurrent partial small bowel obstruction given history of colonic resection vs cyclic constipation.   abd film and labs consistent with constipation.  Trial of linzess 290  Mcg.       Hepatic steatosis    Found in the setting of rapid wt loss of 25 lbs in 2016 By CT scan, Considered to be Doyle to rapid weight loss per GI ..  currently his Liver enzymes remain normal.   Lab Results  Component Value Date   ALT 18 09/20/2017   AST 22 09/20/2017   ALKPHOS 69 09/20/2017   BILITOT 0.5 09/20/2017          Relevant Orders   Comprehensive metabolic panel (Completed)   CMC arthritis, thumb, degenerative    Resolved surgically,  With good ROM and restored strength.        Other Visit Diagnoses    Slow transit constipation    -  Primary   Relevant Orders   DG Abd 1 View (Completed)   TSH (Completed)   Generalized abdominal pain       Relevant Orders   Magnesium (Completed)   CBC with Differential/Platelet (Completed)   Impaired fasting glucose       Relevant Orders  Hemoglobin A1c (Completed)      I have discontinued Vipul B. Sandles's valACYclovir. I am also having him start on dexlansoprazole. Additionally, I am having him maintain his B-complex with vitamin C, aspirin-acetaminophen-caffeine, amLODipine, and zolpidem.  Meds ordered this encounter  Medications  . dexlansoprazole (DEXILANT) 60 MG capsule    Sig: Take 1 capsule (60 mg total) by mouth daily.    Dispense:  30 capsule    Refill:  1    Medications Discontinued During This Encounter  Medication Reason  . valACYclovir (VALTREX) 1000 MG tablet Completed Course    Follow-up: No follow-ups on file.   Crecencio Mc, MD

## 2017-09-22 ENCOUNTER — Encounter: Payer: Self-pay | Admitting: Internal Medicine

## 2017-09-22 DIAGNOSIS — R1013 Epigastric pain: Secondary | ICD-10-CM | POA: Insufficient documentation

## 2017-09-22 NOTE — Assessment & Plan Note (Signed)
Found in the setting of rapid wt loss of 25 lbs in 2016 By CT scan, Considered to be due to rapid weight loss per GI .. currently his Liver enzymes remain normal.   Lab Results  Component Value Date   ALT 18 09/20/2017   AST 22 09/20/2017   ALKPHOS 69 09/20/2017   BILITOT 0.5 09/20/2017

## 2017-09-22 NOTE — Assessment & Plan Note (Signed)
Resolved surgically,  With good ROM and restored strength.

## 2017-09-22 NOTE — Assessment & Plan Note (Signed)
unclear etiology as it has currently resolved.  History suggestive of recurrent partial small bowel obstruction given history of colonic resection vs cyclic constipation.   abd film and labs consistent with constipation.  Trial of linzess 290  Mcg.

## 2017-09-22 NOTE — Assessment & Plan Note (Signed)
Advised to take nexium bid,  Follow a diet for GERD,  Avoid eating within 2 hours of bedtime, and use a wedge.  If symptoms persistent resume Dexilant  vs GI evaluation .Last endo was in 2016 and noted gastritis and small hiatal hernia.

## 2017-11-29 ENCOUNTER — Encounter: Payer: Self-pay | Admitting: Internal Medicine

## 2017-12-22 ENCOUNTER — Other Ambulatory Visit: Payer: Self-pay | Admitting: Internal Medicine

## 2017-12-23 NOTE — Telephone Encounter (Signed)
Printed, signed and faxed.  

## 2017-12-23 NOTE — Telephone Encounter (Signed)
Refilled: 08/05/2017 Last OV: 09/20/2017 Next OV: not scheduled

## 2018-01-09 DIAGNOSIS — E7800 Pure hypercholesterolemia, unspecified: Secondary | ICD-10-CM

## 2018-01-09 DIAGNOSIS — E349 Endocrine disorder, unspecified: Secondary | ICD-10-CM

## 2018-01-10 MED ORDER — SILDENAFIL CITRATE 20 MG PO TABS: 20.0000 mg | ORAL_TABLET | Freq: Three times a day (TID) | ORAL | 0 refills | Status: DC

## 2018-01-10 MED ORDER — AMLODIPINE BESYLATE 5 MG PO TABS: 5.0000 mg | ORAL_TABLET | Freq: Every day | ORAL | 0 refills | Status: DC

## 2018-01-10 NOTE — Addendum Note (Signed)
Addended by: Crecencio Mc on: 01/10/2018 05:14 PM   Modules accepted: Orders

## 2018-01-13 ENCOUNTER — Other Ambulatory Visit: Payer: Self-pay | Admitting: *Deleted

## 2018-01-13 MED ORDER — SILDENAFIL CITRATE 20 MG PO TABS: 20.0000 mg | ORAL_TABLET | Freq: Three times a day (TID) | ORAL | 0 refills | Status: DC

## 2018-01-17 ENCOUNTER — Other Ambulatory Visit (INDEPENDENT_AMBULATORY_CARE_PROVIDER_SITE_OTHER): Payer: BC Managed Care – PPO

## 2018-01-17 DIAGNOSIS — E7800 Pure hypercholesterolemia, unspecified: Secondary | ICD-10-CM

## 2018-01-17 DIAGNOSIS — E349 Endocrine disorder, unspecified: Secondary | ICD-10-CM

## 2018-01-17 NOTE — Addendum Note (Signed)
Addended by: Arby Barrette on: 01/17/2018 08:28 AM   Modules accepted: Orders

## 2018-01-28 LAB — TESTOSTERONE: TESTOSTERONE: 459 ng/dL (ref 264–916)

## 2018-01-28 LAB — TESTOSTERONE, % FREE: Testosterone-% Free: 1.3 %

## 2018-01-28 LAB — LIPID PANEL

## 2018-01-28 LAB — TESTOSTERONE, FREE: Testosterone, Free: 10.8 pg/mL (ref 6.6–18.1)

## 2018-01-28 LAB — SEX HORMONE BINDING GLOBULIN: Sex Hormone Binding: 47 nmol/L (ref 19.3–76.4)

## 2018-04-10 ENCOUNTER — Other Ambulatory Visit: Payer: Self-pay | Admitting: Internal Medicine

## 2018-04-16 ENCOUNTER — Ambulatory Visit: Payer: BC Managed Care – PPO | Admitting: Family

## 2018-04-16 ENCOUNTER — Encounter: Payer: Self-pay | Admitting: Family

## 2018-04-16 VITALS — BP 116/78 | HR 83 | Temp 98.4°F | Wt 210.0 lb

## 2018-04-16 DIAGNOSIS — H109 Unspecified conjunctivitis: Secondary | ICD-10-CM | POA: Diagnosis not present

## 2018-04-16 MED ORDER — ERYTHROMYCIN 5 MG/GM OP OINT: TOPICAL_OINTMENT | OPHTHALMIC | 0 refills | Status: DC

## 2018-04-16 MED ORDER — OLOPATADINE HCL 0.1 % OP SOLN: 1.0000 [drp] | Freq: Two times a day (BID) | OPHTHALMIC | 12 refills | Status: DC

## 2018-04-16 NOTE — Patient Instructions (Addendum)
Start antibiotic for left eye  Warm compresses for lid.   As discussed, I am reassured that you are not having any foreign body sensation of the eye, sensitivity to light, eye pain, I will go ahead and send an antibiotic to treat empirically for bacterial conjunctivitis.   However I remain concern especially in patients with extended contact lens wears for more concerning eye infection called pseudomonal keratitis, especially with use of extended-wear lenses . In this scenario, I would like for you to call your ophthalmologist and make him aware that you were seen today.  I want to ensure he would not recommend an office visit, which he may since you were contact lenses.  Let me know how you are doing   Bacterial Conjunctivitis Bacterial conjunctivitis is an infection of the clear membrane that covers the white part of your eye and the inner surface of your eyelid (conjunctiva). When the blood vessels in your conjunctiva become inflamed, your eye becomes red or pink, and it will probably feel itchy. Bacterial conjunctivitis spreads very easily from person to person (is contagious). It also spreads easily from one eye to the other eye. What are the causes? This condition is caused by several common bacteria. You may get the infection if you come into close contact with another person who is infected. You may also come into contact with items that are contaminated with the bacteria, such as a face towel, contact lens solution, or eye makeup. What increases the risk? This condition is more likely to develop in people who:  Are exposed to other people who have the infection.  Wear contact lenses.  Have a sinus infection.  Have had a recent eye injury or surgery.  Have a weak body defense system (immune system).  Have a medical condition that causes dry eyes.  What are the signs or symptoms? Symptoms of this condition include:  Eye redness.  Tearing or watery eyes.  Itchy  eyes.  Burning feeling in your eyes.  Thick, yellowish discharge from an eye. This may turn into a crust on the eyelid overnight and cause your eyelids to stick together.  Swollen eyelids.  Blurred vision.  How is this diagnosed? Your health care provider can diagnose this condition based on your symptoms and medical history. Your health care provider may also take a sample of discharge from your eye to find the cause of your infection. This is rarely done. How is this treated? Treatment for this condition includes:  Antibiotic eye drops or ointment to clear the infection more quickly and prevent the spread of infection to others.  Oral antibiotic medicines to treat infections that do not respond to drops or ointments, or last longer than 10 days.  Cool, wet cloths (cool compresses) placed on the eyes.  Artificial tears applied 2-6 times a day.  Follow these instructions at home: Medicines  Take or apply your antibiotic medicine as told by your health care provider. Do not stop taking or applying the antibiotic even if you start to feel better.  Take or apply over-the-counter and prescription medicines only as told by your health care provider.  Be very careful to avoid touching the edge of your eyelid with the eye drop bottle or the ointment tube when you apply medicines to the affected eye. This will keep you from spreading the infection to your other eye or to other people. Managing discomfort  Gently wipe away any drainage from your eye with a warm, wet washcloth or a  cotton ball.  Apply a cool, clean washcloth to your eye for 10-20 minutes, 3-4 times a day. General instructions  Do not wear contact lenses until the inflammation is gone and your health care provider says it is safe to wear them again. Ask your health care provider how to sterilize or replace your contact lenses before you use them again. Wear glasses until you can resume wearing contacts.  Avoid wearing  eye makeup until the inflammation is gone. Throw away any old eye cosmetics that may be contaminated.  Change or wash your pillowcase every day.  Do not share towels or washcloths. This may spread the infection.  Wash your hands often with soap and water. Use paper towels to dry your hands.  Avoid touching or rubbing your eyes.  Do not drive or use heavy machinery if your vision is blurred. Contact a health care provider if:  You have a fever.  Your symptoms do not get better after 10 days. Get help right away if:  You have a fever and your symptoms suddenly get worse.  You have severe pain when you move your eye.  You have facial pain, redness, or swelling.  You have sudden loss of vision. This information is not intended to replace advice given to you by your health care provider. Make sure you discuss any questions you have with your health care provider. Document Released: 05/07/2005 Document Revised: 09/15/2015 Document Reviewed: 02/17/2015 Elsevier Interactive Patient Education  2017 [employer].

## 2018-04-16 NOTE — Progress Notes (Signed)
Subjective:    Patient ID: Seth Doyle, male    DOB: 05-05-1956, 62 y.o.   MRN: [id number]  CC: Seth Doyle is a 62 y.o. male who presents today for an acute visit.    HPI: Left eye itching and redness x one day, worsening.  Eyelid feels tender, swollen. No eye pain or gritty sensation.   No pain or itching of right eye.   No eye drainage, cough, congestion. NO foreign body sensation. No photophobia. )Crusted shut this morning.   No vision changes. No known seasonal allegeries.   Seen his regular opthalmologist couple of weeks ago, normal exam per patient. At that time had blurry vision and double vision which has resolved. Pending blood work at this time.   Wears glasses and contacts. Has been wearing contacts, threw them away last night. Wears 12-15 hours per day and has monthly's - wears them 5 weeks.     HISTORY:  Past Medical History:  Diagnosis Date  . Chronic insomnia 03/26/2014   Managed with generic ambien.     . 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 Constipation 02/22/2015  . Diverticulitis   . Diverticulitis of sigmoid colon 02/04/2011  . Essential hypertension 04/27/2014   related to event. no longer on medicines.  . Hepatic steatosis 02/22/2015  . Left rotator cuff tear 10/30/2013  . Reflux esophagitis 09/10/2012  . Subacromial bursitis 09/18/2013  . Urinary retention due to benign prostatic hyperplasia 04/27/2014  . Vertigo    3 episodes.  last one approx 6 mos ago  . Wears contact lenses    Past Surgical History:  Procedure Laterality Date  . COLON SURGERY  August 2012   sigmoid colectomy, diverticulitis  . COLONOSCOPY WITH PROPOFOL N/A 05/06/2015   Procedure: COLONOSCOPY WITH PROPOFOL;  Surgeon: Lucilla Lame, MD;  Location: [facility];  Service: Endoscopy;  Laterality: N/A;  . EYE SURGERY     tear duct repair   . POLYPECTOMY  05/06/2015   Procedure: POLYPECTOMY;  Surgeon: Lucilla Lame, MD;  Location: [facility];  Service:  Endoscopy;;  . RHINOPLASTY  1992   deviated septum due to volleyball  . RHINOPLASTY  1992   foe deviated septm, traumatic  [facility]   . ROTATOR CUFF REPAIR  1991   Dr. Marry Guan  . SHOULDER ARTHROSCOPY W/ ROTATOR CUFF REPAIR  1991   [facility]   Family History  Problem Relation Age of Onset  . Diabetes Mother   . Mental illness Mother        Dementia  . COPD Father   . Hyperlipidemia Father   . Mental illness Father        Parkinson's Disease    Allergies: Wellbutrin [bupropion] Current Outpatient Medications on File Prior to Visit  Medication Sig Dispense Refill  . aspirin-acetaminophen-caffeine (EXCEDRIN MIGRAINE) 250-250-65 MG tablet Take 2 tablets by mouth every 6 (six) hours as needed for headache.    . B Complex-C (B-COMPLEX WITH VITAMIN C) tablet Take 1 tablet by mouth daily.    . sildenafil (REVATIO) 20 MG tablet Take 1 tablet (20 mg total) by mouth 3 (three) times daily. 30 tablet 0  . zolpidem (AMBIEN) 10 MG tablet TAKE 1 TABLET BY MOUTH AT BEDTIME IF NEEDED FOR SLEEP 30 tablet 5  . amLODipine (NORVASC) 5 MG tablet TAKE 1 TABLET(5 MG) BY MOUTH DAILY (Patient not taking: Reported on 04/16/2018) 90 tablet 0  . dexlansoprazole (DEXILANT) 60 MG capsule Take 1 capsule (60 mg total) by mouth  daily. (Patient not taking: Reported on 04/16/2018) 30 capsule 1   No current facility-administered medications on file prior to visit.     Social History   Tobacco Use  . Smoking status: Never Smoker  . Smokeless tobacco: Never Used  Substance Use Topics  . Alcohol use: Yes    Comment: rare - once /year  . Drug use: No    Review of Systems  Constitutional: Negative for chills and fever.  HENT: Negative for congestion and sore throat.   Eyes: Positive for discharge, redness and itching. Negative for photophobia, pain and visual disturbance.  Respiratory: Negative for cough.   Cardiovascular: Negative for chest pain and palpitations.  Gastrointestinal: Negative for nausea and  vomiting.  Neurological: Negative for dizziness and headaches.      Objective:    BP 116/78 (BP Location: Left Arm, Patient Position: Sitting, Cuff Size: Normal)   Pulse 83   Temp 98.4 F (36.9 C)   Wt 210 lb (95.3 kg)   SpO2 95%   BMI 29.29 kg/m    Physical Exam  Constitutional: He appears well-developed and well-nourished.  HENT:  Head: Normocephalic and atraumatic.  Right Ear: Hearing, tympanic membrane, external ear and ear canal normal. No drainage, swelling or tenderness. Tympanic membrane is not injected, not erythematous and not bulging. No middle ear effusion. No decreased hearing is noted.  Left Ear: Hearing, tympanic membrane, external ear and ear canal normal. No drainage, swelling or tenderness. Tympanic membrane is not injected, not erythematous and not bulging.  No middle ear effusion. No decreased hearing is noted.  Nose: Nose normal. Right sinus exhibits no maxillary sinus tenderness and no frontal sinus tenderness. Left sinus exhibits no maxillary sinus tenderness and no frontal sinus tenderness.  Mouth/Throat: Uvula is midline, oropharynx is clear and moist and mucous membranes are normal. No oropharyngeal exudate, posterior oropharyngeal edema, posterior oropharyngeal erythema or tonsillar abscesses.  Eyes: Pupils are equal, round, and reactive to light. Conjunctivae, EOM and lids are normal. Lids are everted and swept, no foreign bodies found. Right eye exhibits no discharge and no hordeolum. No foreign body present in the right eye. Left eye exhibits discharge. Left eye exhibits no exudate and no hordeolum.  No external eye lesions. Surrounding skin intact.   Left eye:   Diffuse injection of the conjunctiva. No white spots, corneal opacity, or foreign body appreciated. No collection of blood or pus in the anterior chamber. No ciliary flush surrounding iris.   No photophobia or eye pain appreciated during exam. No pain with EOMs.      Cardiovascular: Regular  rhythm and normal heart sounds.  Pulmonary/Chest: Effort normal and breath sounds normal. No respiratory distress. He has no wheezes. He has no rhonchi. He has no rales.  Lymphadenopathy:       Head (right side): No submental, no submandibular, no tonsillar, no preauricular, no posterior auricular and no occipital adenopathy present.       Head (left side): No submental, no submandibular, no tonsillar, no preauricular, no posterior auricular and no occipital adenopathy present.    He has no cervical adenopathy.       Right cervical: No superficial cervical, no deep cervical and no posterior cervical adenopathy present.      Left cervical: No superficial cervical, no deep cervical and no posterior cervical adenopathy present.  Neurological: He is alert.  Skin: Skin is warm and dry.  Psychiatric: He has a normal mood and affect. His speech is normal and behavior is normal.  Vitals reviewed.      Assessment & Plan:   1. Conjunctivitis of left eye, unspecified conjunctivitis type Working diagnosis of bacterial conjunctivitis.  Patient is a  contact lens wear, we discussed at great length my concern for more serious infection Pseudomonas keratitis.  I advised that I will go ahead and start empiric erythromycin ointment however he was to call the ophthalmologist today to ensure he should not be seen.  He notes that he wore his monthly contacts longer than 1 month.  In this setting, I very much encouraged him to follow-up with ophthalmology.  I am reassured however as he does not have any acute red flag symptoms including foreign body sensation, photophobia, eye pain today.  Patient let me know if symptoms not resolved. - erythromycin ophthalmic ointment; Use one half inch four times daily to affected eye (s) x 7 days.  Dispense: 3.5 g; Refill: 0    I am having Chuck B. Anselmi maintain his B-complex with vitamin C, aspirin-acetaminophen-caffeine, dexlansoprazole, zolpidem, sildenafil, and  amLODipine.   No orders of the defined types were placed in this encounter.   Return precautions given.   Risks, benefits, and alternatives of the medications and treatment plan prescribed today were discussed, and patient expressed understanding.   Education regarding symptom management and diagnosis given to patient on AVS.  Continue to follow with Crecencio Mc, MD for routine health maintenance.   Fransico Michael and I agreed with plan.   Mable Paris, FNP

## 2018-05-23 NOTE — Telephone Encounter (Signed)
Patient denies fever, chills, or body aches, has productive  Clear mucous cough only with nasal congestion is using OTC cough syrup . I scheduled patient for Monday at 10 am .

## 2018-05-26 ENCOUNTER — Encounter: Payer: Self-pay | Admitting: Internal Medicine

## 2018-05-26 ENCOUNTER — Ambulatory Visit: Payer: BC Managed Care – PPO | Admitting: Internal Medicine

## 2018-05-26 DIAGNOSIS — H6501 Acute serous otitis media, right ear: Secondary | ICD-10-CM | POA: Diagnosis not present

## 2018-05-26 DIAGNOSIS — H6690 Otitis media, unspecified, unspecified ear: Secondary | ICD-10-CM | POA: Insufficient documentation

## 2018-05-26 MED ORDER — HYDROCOD POLST-CPM POLST ER 10-8 MG/5ML PO SUER: 5.0000 mL | Freq: Every evening | ORAL | 0 refills | Status: DC | PRN

## 2018-05-26 MED ORDER — AMOXICILLIN-POT CLAVULANATE 875-125 MG PO TABS: 1.0000 | ORAL_TABLET | Freq: Two times a day (BID) | ORAL | 0 refills | Status: DC

## 2018-05-26 MED ORDER — PREDNISONE 10 MG PO TABS: ORAL_TABLET | ORAL | 0 refills | Status: DC

## 2018-05-26 MED ORDER — BENZONATATE 200 MG PO CAPS: 200.0000 mg | ORAL_CAPSULE | Freq: Two times a day (BID) | ORAL | 0 refills | Status: DC | PRN

## 2018-05-26 NOTE — Assessment & Plan Note (Signed)
Given chronicity of symptoms, and exam consistent with bacterial otitis  Will treat with empiric antibiotics, decongestants, and steroid taper  Adding tussionex for nocturnal cough

## 2018-05-26 NOTE — Patient Instructions (Addendum)
I am treating you for sinusitis/otitis which is a complication from your viral infection due to  persistent sinus congestion.   I am prescribing an antibiotic (augmentin) and a prednisone taper  To manage the infection and the inflammation in your ear/sinuses.   I also advise use of the following meds to help with your cough , congestion and drainage .   Take generic OTC benadryl 25 mg one hour before  at bedtime ,  Continue Sudafed   flush your sinuses twice daily with Simply Saline (do over the sink because if you do it right you will spit out globs of mucus)  Use benzonatate capsules   FOR THE daytime COUGH.  Gargle with salt water as needed for sore throat.   Use the tussionex liquid cough syrup at night for severe cough or headache.  It has vicodin in it so DO NOT SHARE WITH OTHERS   Please take a probiotic ( Align, Floraque or Culturelle) of the generic version of one of these  For a minimum of 3 weeks to prevent a serious antibiotic associated diarrhea  Called clostridium dificile colitis

## 2018-05-26 NOTE — Progress Notes (Signed)
Subjective:  Patient ID: Seth Doyle, male    DOB: February 06, 1956  Age: 63 y.o. MRN: [id number]  CC: The encounter diagnosis was Non-recurrent acute serous otitis media of right ear.  HPI DJ SENTENO presents for persistent sinus congestion accompanied by intermittent purulent drainage, recurrent dizziness,   facial pan.  sympots have been present since Dec 424.  He Has been using Sudafed ,  Saline lavage and Delsym  With no siginificant change.    Outpatient Medications Prior to Visit  Medication Sig Dispense Refill  . B Complex-C (B-COMPLEX WITH VITAMIN C) tablet Take 1 tablet by mouth daily.    . sildenafil (REVATIO) 20 MG tablet Take 1 tablet (20 mg total) by mouth 3 (three) times daily. 30 tablet 0  . zolpidem (AMBIEN) 10 MG tablet TAKE 1 TABLET BY MOUTH AT BEDTIME IF NEEDED FOR SLEEP 30 tablet 5  . amLODipine (NORVASC) 5 MG tablet TAKE 1 TABLET(5 MG) BY MOUTH DAILY (Patient not taking: Reported on 04/16/2018) 90 tablet 0  . aspirin-acetaminophen-caffeine (EXCEDRIN MIGRAINE) 250-250-65 MG tablet Take 2 tablets by mouth every 6 (six) hours as needed for headache.    . dexlansoprazole (DEXILANT) 60 MG capsule Take 1 capsule (60 mg total) by mouth daily. (Patient not taking: Reported on 04/16/2018) 30 capsule 1  . erythromycin ophthalmic ointment Use one half inch four times daily to affected eye (s) x 7 days. (Patient not taking: Reported on 05/26/2018) 3.5 g 0  . olopatadine (PATANOL) 0.1 % ophthalmic solution Place 1 drop into the left eye 2 (two) times daily. 5 mL 12   No facility-administered medications prior to visit.     Review of Systems;  Patient denies headache, fevers, malaise, unintentional weight loss, skin rash, eye pain, sinus congestion and sinus pain, sore throat, dysphagia,  hemoptysis , cough, dyspnea, wheezing, chest pain, palpitations, orthopnea, edema, abdominal pain, nausea, melena, diarrhea, constipation, flank pain, dysuria, hematuria, urinary  Frequency,  nocturia, numbness, tingling, seizures,  Focal weakness, Loss of consciousness,  Tremor, insomnia, depression, anxiety, and suicidal ideation.      Objective:  BP 118/78 (BP Location: Left Arm, Patient Position: Sitting, Cuff Size: Large)   Pulse 70   Temp 98 F (36.7 C) (Oral)   Resp 15   Ht 5\' 11"  (1.803 m)   Wt 218 lb 12.8 oz (99.2 kg)   SpO2 95%   BMI 30.52 kg/m   BP Readings from Last 3 Encounters:  05/26/18 118/78  04/16/18 116/78  09/20/17 126/82    Wt Readings from Last 3 Encounters:  05/26/18 218 lb 12.8 oz (99.2 kg)  04/16/18 210 lb (95.3 kg)  09/20/17 216 lb 12.8 oz (98.3 kg)    General appearance: alert, cooperative and appears stated age Ears: right TM bulging and erythematous , left TM retracted Face:  Sinuses nontender. Throat: lips, mucosa, and tongue normal; teeth and gums normal Neck: no adenopathy, no carotid bruit, supple, symmetrical, trachea midline and thyroid not enlarged, symmetric, no tenderness/mass/nodules Back: symmetric, no curvature. ROM normal. No CVA tenderness. Lungs: clear to auscultation bilaterally Heart: regular rate and rhythm, S1, S2 normal, no murmur, click, rub or gallop Abdomen: soft, non-tender; bowel sounds normal; no masses,  no organomegaly Pulses: 2+ and symmetric Skin: Skin color, texture, turgor normal. No rashes or lesions Lymph nodes: Cervical, supraclavicular, and axillary nodes normal.  Lab Results  Component Value Date   HGBA1C 5.7 09/20/2017   HGBA1C 5.7 01/06/2016    Lab Results  Component Value Date  CREATININE 1.14 09/20/2017   CREATININE 1.11 07/07/2017   CREATININE 1.10 05/31/2017    Lab Results  Component Value Date   WBC 6.5 09/20/2017   HGB 14.6 09/20/2017   HCT 42.9 09/20/2017   PLT 249.0 09/20/2017   GLUCOSE 72 09/20/2017   CHOL CANCELED 01/17/2018   TRIG CANCELED 01/17/2018   HDL CANCELED 01/17/2018   LDLDIRECT 69.0 02/28/2016   LDLCALC 90 12/07/2016   ALT 18 09/20/2017   AST 22  09/20/2017   NA 140 09/20/2017   K 3.9 09/20/2017   CL 103 09/20/2017   CREATININE 1.14 09/20/2017   BUN 13 09/20/2017   CO2 30 09/20/2017   TSH 1.02 09/20/2017   PSA 0.7 05/31/2017   HGBA1C 5.7 09/20/2017   MICROALBUR 0.7 02/28/2016    No results found.  Assessment & Plan:   Problem List Items Addressed This Visit    Otitis media    Given chronicity of symptoms, and exam consistent with bacterial otitis  Will treat with empiric antibiotics, decongestants, and steroid taper  Adding tussionex for nocturnal cough        Relevant Medications   amoxicillin-clavulanate (AUGMENTIN) 875-125 MG tablet      I have discontinued Tilmon B. Pate's aspirin-acetaminophen-caffeine, dexlansoprazole, amLODipine, erythromycin, and olopatadine. I am also having him start on predniSONE, amoxicillin-clavulanate, chlorpheniramine-HYDROcodone, and benzonatate. Additionally, I am having him maintain his B-complex with vitamin C, zolpidem, and sildenafil.  Meds ordered this encounter  Medications  . predniSONE (DELTASONE) 10 MG tablet    Sig: 6 tablets daily for 3 days , then reduce by 1 tablet daily until gone    Dispense:  33 tablet    Refill:  0  . amoxicillin-clavulanate (AUGMENTIN) 875-125 MG tablet    Sig: Take 1 tablet by mouth 2 (two) times daily.    Dispense:  14 tablet    Refill:  0  . chlorpheniramine-HYDROcodone (TUSSIONEX PENNKINETIC ER) 10-8 MG/5ML SUER    Sig: Take 5 mLs by mouth at bedtime as needed for cough.    Dispense:  140 mL    Refill:  0  . benzonatate (TESSALON) 200 MG capsule    Sig: Take 1 capsule (200 mg total) by mouth 2 (two) times daily as needed for cough.    Dispense:  30 capsule    Refill:  0    Medications Discontinued During This Encounter  Medication Reason  . aspirin-acetaminophen-caffeine (EXCEDRIN MIGRAINE) 250-250-65 MG tablet Patient Preference  . dexlansoprazole (DEXILANT) 60 MG capsule Patient has not taken in last 30 days  . erythromycin  ophthalmic ointment Error  . olopatadine (PATANOL) 0.1 % ophthalmic solution   . amLODipine (NORVASC) 5 MG tablet Error    Follow-up: No follow-ups on file.   Crecencio Mc, MD

## 2018-05-29 ENCOUNTER — Telehealth: Payer: Self-pay | Admitting: Internal Medicine

## 2018-05-29 MED ORDER — HYDROCODONE-HOMATROPINE 5-1.5 MG/5ML PO SYRP: 5.0000 mL | ORAL_SOLUTION | Freq: Three times a day (TID) | ORAL | 0 refills | Status: DC | PRN

## 2018-05-29 NOTE — Telephone Encounter (Signed)
LM that alternative was sent to pharmacy.

## 2018-05-29 NOTE — Telephone Encounter (Signed)
Copied from [place] [id number]. Topic: Quick Communication - Rx Refill/Question >> May 29, 2018  3:35 PM [name] wrote: Medication: (on Backorder) chlorpheniramine-HYDROcodone (TUSSIONEX [facility] ER) 10-8 MG/5ML SUER  Has the patient contacted their pharmacy? Yes (Pt was advised that this medication was on a national backorder and would not be available for a few weeks.)  Pt needs something else called in asap   Preferred Pharmacy (with phone number or street name): [id number] - [place], [place] - [place] [phone] (Phone) [phone] (Fax)    Agent: Please be advised that RX refills may take up to 3 business days. We ask that you follow-up with your pharmacy.

## 2018-05-29 NOTE — Telephone Encounter (Signed)
I sent in the alternative cough medicine

## 2018-05-29 NOTE — Telephone Encounter (Signed)
Copied from [place] [id number]. Topic: Quick Communication - Rx Refill/Question >> May 29, 2018  3:35 PM [name] wrote: Medication: (on Backorder) chlorpheniramine-HYDROcodone (TUSSIONEX [facility] ER) 10-8 MG/5ML SUER  Has the patient contacted their pharmacy? Yes (Pt was advised that this medication was on a national backorder and would not be available for a few weeks.)  Pt needs something else called in asap   Preferred Pharmacy (with phone number or street name): [id number] - [place], [place] - [place] [phone] (Phone) [phone] (Fax)    Agent: Please be advised that RX refills may take up to 3 business days. We ask that you follow-up with your pharmacy.

## 2018-05-29 NOTE — Telephone Encounter (Signed)
Copied from [place] [id number]. Topic: Quick Communication - Rx Refill/Question >> May 29, 2018  3:35 PM [name] wrote: Medication: (on Backorder) chlorpheniramine-HYDROcodone (TUSSIONEX [facility] ER) 10-8 MG/5ML SUER  Has the patient contacted their pharmacy? Yes (Pt was advised that this medication was on a national backorder and would not be available for a few weeks.)  Pt needs something else called in asap   Preferred Pharmacy (with phone number or street name): [id number] - [place], [place] - [place] [phone] (Phone) [phone] (Fax)    Agent: Please be advised that RX refills may take up to 3 business days. We ask that you follow-up with your pharmacy.

## 2018-05-30 ENCOUNTER — Telehealth: Payer: Self-pay | Admitting: *Deleted

## 2018-05-30 NOTE — Telephone Encounter (Signed)
Copied from [place] [id number]. Topic: General - Other >> May 29, 2018  4:20 PM Windy Kalata wrote: Reason for CRM: Patient called and let him know that his alternate cough medicine was called into Pharmacy.

## 2018-06-30 ENCOUNTER — Other Ambulatory Visit: Payer: Self-pay | Admitting: Internal Medicine

## 2018-06-30 MED ORDER — DICLOFENAC SODIUM 75 MG PO TBEC: 75.0000 mg | DELAYED_RELEASE_TABLET | Freq: Two times a day (BID) | ORAL | 3 refills | Status: DC

## 2018-07-01 ENCOUNTER — Other Ambulatory Visit: Payer: Self-pay | Admitting: Internal Medicine

## 2018-07-01 ENCOUNTER — Other Ambulatory Visit: Payer: Self-pay

## 2018-07-01 MED ORDER — ZOLPIDEM TARTRATE 10 MG PO TABS: ORAL_TABLET | ORAL | 5 refills | Status: DC

## 2018-07-01 NOTE — Telephone Encounter (Signed)
Refilled: 12/23/2017 Last OV: 05/26/2018 Next OV: not scheduled

## 2018-07-03 ENCOUNTER — Encounter: Payer: Self-pay | Admitting: Family Medicine

## 2018-07-03 ENCOUNTER — Ambulatory Visit: Payer: BC Managed Care – PPO | Admitting: Family Medicine

## 2018-07-03 VITALS — BP 132/80 | HR 83 | Temp 98.7°F | Resp 16 | Ht 71.0 in | Wt 219.4 lb

## 2018-07-03 DIAGNOSIS — R6889 Other general symptoms and signs: Secondary | ICD-10-CM

## 2018-07-03 DIAGNOSIS — R05 Cough: Secondary | ICD-10-CM

## 2018-07-03 DIAGNOSIS — R059 Cough, unspecified: Secondary | ICD-10-CM

## 2018-07-03 LAB — POC INFLUENZA A&B (BINAX/QUICKVUE)
INFLUENZA A, POC: NEGATIVE
Influenza B, POC: NEGATIVE

## 2018-07-03 MED ORDER — OSELTAMIVIR PHOSPHATE 75 MG PO CAPS: 75.0000 mg | ORAL_CAPSULE | Freq: Two times a day (BID) | ORAL | 0 refills | Status: DC

## 2018-07-03 NOTE — Progress Notes (Signed)
Subjective:    Patient ID: Seth Doyle, male    DOB: 25-Jul-1955, 63 y.o.   MRN: [id number]  HPI   Presents to clinic complaining of body aches, fever, chills, cough headache for 1 day.  Patient states he woke up and just felt awful yesterday morning.  He has been using Tylenol and Motrin as needed to help keep aches under control.  He did have some hydrocodone cough syrup left over from a respiratory illness back in beginning of January, has used this with success to reduce cough.  Unknown if he has had any sick contacts.  Denies nausea/vomiting or diarrhea.  States his wife is a Marine scientist, it is possible she was exposed to something and then exposed him.  Wife has no symptoms.   Patient Active Problem List   Diagnosis Date Noted  . Otitis media 05/26/2018  . Intermittent epigastric abdominal pain 09/22/2017  . Small intestinal bacterial overgrowth 12/09/2016  . Primary hypogonadism in male 07/03/2015  . Personal history of colonic polyps   . Intestinal bypass or anastomosis status   . Benign neoplasm of transverse colon   . Hepatic steatosis 02/22/2015  . 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 Vitamin D deficiency 05/02/2014  . Urinary retention due to benign prostatic hyperplasia 04/27/2014  . Essential hypertension 04/27/2014  . Encounter for preventive health examination 04/27/2014  . Chronic insomnia 03/26/2014  . 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 Reflux esophagitis 09/10/2012  . Diverticulosis of colon without hemorrhage 02/04/2011   Social History   Tobacco Use  . Smoking status: Never Smoker  . Smokeless tobacco: Never Used  Substance Use Topics  . Alcohol use: Yes    Comment: rare - once /year   Review of Systems  Constitutional: +chills, fatigue and fever.  HENT: Negative for congestion, ear pain, sinus pain and sore throat.   Eyes: Negative.   Respiratory: Negative for shortness of breath and wheezing. +cough  Cardiovascular: Negative  for chest pain, palpitations and leg swelling.  Gastrointestinal: Negative for abdominal pain, diarrhea, nausea and vomiting.  Genitourinary: Negative for dysuria, frequency and urgency.  Musculoskeletal: +body aches Skin: Negative for color change, pallor and rash.  Neurological: Negative for syncope, light-headedness and headaches.  Psychiatric/Behavioral: The patient is not nervous/anxious.       Objective:   Physical Exam Vitals signs and nursing note reviewed.  Constitutional:      General: He is not in acute distress.    Appearance: He is ill-appearing (appears tired). He is not toxic-appearing or diaphoretic.  HENT:     Head: Normocephalic and atraumatic.     Nose: Congestion and rhinorrhea present.     Mouth/Throat:     Mouth: Mucous membranes are moist.     Pharynx: No oropharyngeal exudate or posterior oropharyngeal erythema.  Eyes:     General: No scleral icterus.    Extraocular Movements: Extraocular movements intact.     Conjunctiva/sclera: Conjunctivae normal.  Neck:     Musculoskeletal: Neck supple. No neck rigidity.  Cardiovascular:     Rate and Rhythm: Normal rate and regular rhythm.  Pulmonary:     Effort: Pulmonary effort is normal. No respiratory distress.     Breath sounds: Normal breath sounds.  Lymphadenopathy:     Cervical: No cervical adenopathy.  Skin:    General: Skin is warm and dry.     Coloration: Skin is not pale.  Neurological:     Mental Status: He is alert and oriented to person,  place, and time.  Psychiatric:        Mood and Affect: Mood normal.        Behavior: Behavior normal.     Today's Vitals   07/03/18 1315  BP: 132/80  Pulse: 83  Resp: 16  Temp: 98.7 F (37.1 C)  TempSrc: Oral  SpO2: 92%  Weight: 219 lb 6.4 oz (99.5 kg)  Height: 5\' 11"  (1.803 m)   Body mass index is 30.6 kg/m.     Assessment & Plan:   Flulike illness/viral syndrome - patient's point-of-care flu swab is negative in clinic.  However patient's  symptoms are consistent with influenza.  After discussion of options with patient he would like to take the Tamiflu.  He will also do supportive care including alternating Tylenol and Motrin as needed for aches and fever, keeping up good fluid intake, getting plenty of rest do good handwashing.  We will continue to use already prescribed cough syrup as needed to reduce cough.  Advised that he should begin to feel better in the next 4 - 5 days, if he does not improve as expected he is advised to call office right away.  Patient will keep regularly scheduled follow-up PCP as planned.  Advised to return to clinic sooner if any issues arise.

## 2018-07-03 NOTE — Patient Instructions (Signed)

## 2018-07-10 ENCOUNTER — Other Ambulatory Visit: Payer: Self-pay | Admitting: Internal Medicine

## 2018-07-10 ENCOUNTER — Telehealth: Payer: Self-pay

## 2018-07-10 NOTE — Telephone Encounter (Signed)
Copied from [place] [phone]. Topic: General - Other >> Jul 10, 2018  3:28 PM Carolyn Stare wrote:  Pt said he did not req amlodopine

## 2018-07-10 NOTE — Telephone Encounter (Signed)
LMTCB. I wanted to make sure that he didn't request this medication since it was d/c off medication list for reason being ERROR.

## 2018-07-11 NOTE — Telephone Encounter (Signed)
Noted  

## 2018-07-30 MED ORDER — ZOLPIDEM TARTRATE 10 MG PO TABS: ORAL_TABLET | ORAL | 5 refills | Status: DC

## 2018-07-30 NOTE — Telephone Encounter (Signed)
Rx has been placed up front for pt to pickup.  

## 2018-08-25 ENCOUNTER — Encounter: Payer: Self-pay | Admitting: *Deleted

## 2018-09-02 ENCOUNTER — Ambulatory Visit (INDEPENDENT_AMBULATORY_CARE_PROVIDER_SITE_OTHER): Payer: BC Managed Care – PPO | Admitting: Internal Medicine

## 2018-09-02 ENCOUNTER — Encounter: Payer: Self-pay | Admitting: Internal Medicine

## 2018-09-02 VITALS — BP 128/77 | Temp 98.7°F | Wt 211.0 lb

## 2018-09-02 DIAGNOSIS — K6389 Other specified diseases of intestine: Secondary | ICD-10-CM

## 2018-09-02 DIAGNOSIS — K760 Fatty (change of) liver, not elsewhere classified: Secondary | ICD-10-CM | POA: Diagnosis not present

## 2018-09-02 DIAGNOSIS — R294 Clicking hip: Secondary | ICD-10-CM | POA: Diagnosis not present

## 2018-09-02 DIAGNOSIS — F5104 Psychophysiologic insomnia: Secondary | ICD-10-CM

## 2018-09-02 DIAGNOSIS — Z8679 Personal history of other diseases of the circulatory system: Secondary | ICD-10-CM

## 2018-09-02 NOTE — Progress Notes (Addendum)
Virtual Visit via [employer]  This visit type was conducted due to national recommendations for restrictions regarding the COVID-19 pandemic (e.g. social distancing).  This format is felt to be most appropriate for this patient at this time.  All issues noted in this document were discussed and addressed.  No physical exam was performed (except for noted visual exam findings with Video Visits).   I connected with@ on 09/02/18 at  2:00 PM EDT by a video enabled telemedicine application or telephone and verified that I am speaking with the correct person using two identifiers. Location patient: home Location provider: work or home office Persons participating in the virtual visit: patient, provider  I discussed the limitations, risks, security and privacy concerns of performing an evaluation and management service by telephone and the availability of in person appointments. I also discussed with the patient that there may be a patient responsible charge related to this service. The patient expressed understanding and agreed to proceed.   Reason for visit: 6 month follow up on history of hypertension, chronic  insomnia and fatty liver.  HPI:   63 yr old male with long history of insomnia managed with 5 mg ambien. Presents ofr refill of meds.   Using medication as needed,  Most nights needed,  Denies side effects, has not had to increase dose.  Has tried other available alternatives without good results   Fatty liver/Overweight: Has been out of work for several weeks due to [place] 19 .  Has gained 8 lbs due to change in routine and mandatory closure of  gym.  Long history of body building.  Used testosterone remotely.  Does not drink more than 2 alcoholic beverages per night..  Low glycemic index diet most days.  Gi symptoms are quiescent and bowels and bladder emptying  normally and regularly.  New onset hip problem:  Doesn't do dead lifts anymore since his right hip spontaneously dislocated last year.  Notes that when he walks his dog or distances > 1 mile,  he occasionally develops clicking  Without pain In the left hip,  Has a history of spontaneous right hip dislocation that occurred during an orthopedic evaluation  of his hand  And he spontaneously reduced it     Patient is  Not currently taking his  Medication for hypertension .  Home BP readings checked by his wife, who is an Therapist, sports, have been done about once per week and are  < 130/80 .  he is avoiding added salt in his diet and walking his mastiff [employer]," who weighs 160 lbs!) regularly about 7 days  per week for exercise  .  ROS: See pertinent positives and negatives per HPI.  Past Medical History:  Diagnosis Date  . Chronic insomnia 03/26/2014   Managed with generic ambien.     . 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 Constipation 02/22/2015  . Diverticulitis   . Diverticulitis of sigmoid colon 02/04/2011  . Essential hypertension 04/27/2014   related to event. no longer on medicines.  . Hepatic steatosis 02/22/2015  . Left rotator cuff tear 10/30/2013  . Reflux esophagitis 09/10/2012  . Subacromial bursitis 09/18/2013  . Urinary retention due to benign prostatic hyperplasia 04/27/2014  . Vertigo    3 episodes.  last one approx 6 mos ago  . Wears contact lenses     Past Surgical History:  Procedure Laterality Date  . COLON SURGERY  August 2012   sigmoid colectomy, diverticulitis  . COLONOSCOPY WITH PROPOFOL N/A 05/06/2015  Procedure: COLONOSCOPY WITH PROPOFOL;  Surgeon: Lucilla Lame, MD;  Location: [facility];  Service: Endoscopy;  Laterality: N/A;  . EYE SURGERY     tear duct repair   . POLYPECTOMY  05/06/2015   Procedure: POLYPECTOMY;  Surgeon: Lucilla Lame, MD;  Location: [facility];  Service: Endoscopy;;  . RHINOPLASTY  1992   deviated septum due to volleyball  . RHINOPLASTY  1992   foe deviated septm, traumatic  [place]   . ROTATOR CUFF REPAIR  1991   Dr. Marry Guan  . SHOULDER ARTHROSCOPY W/ ROTATOR  CUFF REPAIR  1991   [facility]    Family History  Problem Relation Age of Onset  . Diabetes Mother   . Mental illness Mother        Dementia  . COPD Father   . Hyperlipidemia Father   . Mental illness Father        Parkinson's Disease    SOCIAL HX: married with kids,     Current Outpatient Medications:  .  B Complex-C (B-COMPLEX WITH VITAMIN C) tablet, Take 1 tablet by mouth daily., Disp: , Rfl:  .  clobetasol (TEMOVATE) 0.05 % external solution, MIX WITH A JAR OF CERAVE CREAM AND  APP EXT AA BID, Disp: , Rfl:  .  diclofenac (VOLTAREN) 75 MG EC tablet, Take 1 tablet (75 mg total) by mouth 2 (two) times daily., Disp: 60 tablet, Rfl: 3 .  hydrOXYzine (ATARAX/VISTARIL) 25 MG tablet, TK 1 T PO HS PRF ITCHING. MAY MAKE DROWSY, Disp: , Rfl:  .  zolpidem (AMBIEN) 10 MG tablet, TAKE 1 TABLET BY MOUTH AT BEDTIME IF NEEDED FOR SLEEP, Disp: 30 tablet, Rfl: 5 .  benzonatate (TESSALON) 200 MG capsule, Take 1 capsule (200 mg total) by mouth 2 (two) times daily as needed for cough. (Patient not taking: Reported on 09/02/2018), Disp: 30 capsule, Rfl: 0  EXAM:  VITALS per patient if applicable:  GENERAL: alert, oriented, appears well and in no acute distress  HEENT: atraumatic, conjunttiva clear, no obvious abnormalities on inspection of external nose and ears  NECK: normal movements of the head and neck  LUNGS: on inspection no signs of respiratory distress, breathing rate appears normal, no obvious gross SOB, gasping or wheezing  CV: no obvious cyanosis  MS: moves all visible extremities without noticeable abnormality  PSYCH/NEURO: pleasant and cooperative, no obvious depression or anxiety, speech and thought processing grossly intact  ASSESSMENT AND PLAN:  Discussed the following assessment and plan:  Clicking of left hip - Plan: DG Hip Unilat W OR W/O Pelvis 2-3 Views Left  Chronic insomnia  History of hypertension  Hepatic steatosis - Plan: Comprehensive metabolic panel  Small  intestinal bacterial overgrowth  Chronic insomnia Managed with ambien.  Refills given.  Encouraged to use sparingly in favor of non pharmcologic methods of relaxation   History of hypertension BPs have been under 130/80  Without medication  per home readings. Prior elevations may have been during reglular use of NSAIDs.   Hepatic steatosis Found in the setting of rapid wt loss of 25 lbs in 2016 By CT scan, Considered to be due to rapid weight loss per GI .Marland Kitchen his Liver enzymes were normal in  May 22019 and need repeating given his weight gain.   Lab Results  Component Value Date   ALT 18 09/20/2017   AST 22 09/20/2017   ALKPHOS 69 09/20/2017   BILITOT 0.5 09/20/2017      Small intestinal bacterial overgrowth He has not had  recurrent symptoms since his second episode that resolved spontaneously  Clicking of left hip Given his history of spontaneous dislocation of the right hip,  Concern for similar potential occurrence in left.  Plain films ordered.  History of weight lifting/body building likely contributory to DJD     I discussed the assessment and treatment plan with the patient. The patient was provided an opportunity to ask questions and all were answered. The patient agreed with the plan and demonstrated an understanding of the instructions.   The patient was advised to call back or seek an in-person evaluation if the symptoms worsen or if the condition fails to improve as anticipated.  I provided 25 minutes of non-face-to-face time during this encounter.   Crecencio Mc, MD

## 2018-09-03 DIAGNOSIS — R294 Clicking hip: Secondary | ICD-10-CM | POA: Insufficient documentation

## 2018-09-03 DIAGNOSIS — M169 Osteoarthritis of hip, unspecified: Secondary | ICD-10-CM | POA: Insufficient documentation

## 2018-09-03 NOTE — Assessment & Plan Note (Signed)
Found in the setting of rapid wt loss of 25 lbs in 2016 By CT scan, Considered to be due to rapid weight loss per GI .Marland Kitchen his Liver enzymes were normal in  May 22019 and need repeating given his weight gain.   Lab Results  Component Value Date   ALT 18 09/20/2017   AST 22 09/20/2017   ALKPHOS 69 09/20/2017   BILITOT 0.5 09/20/2017

## 2018-09-03 NOTE — Assessment & Plan Note (Addendum)
BPs have been under 130/80  Without medication  per home readings. Prior elevations may have been during reglular use of NSAIDs.

## 2018-09-03 NOTE — Assessment & Plan Note (Signed)
He has not had recurrent symptoms since his second episode that resolved spontaneously

## 2018-09-03 NOTE — Assessment & Plan Note (Signed)
Managed with ambien.  Refills given.  Encouraged to use sparingly in favor of non pharmcologic methods of relaxation

## 2018-09-03 NOTE — Assessment & Plan Note (Signed)
Given his history of spontaneous dislocation of the right hip,  Concern for similar potential occurrence in left.  Plain films ordered.  History of weight lifting/body building likely contributory to DJD

## 2018-10-15 ENCOUNTER — Other Ambulatory Visit: Payer: Self-pay | Admitting: Internal Medicine

## 2018-10-15 DIAGNOSIS — Z7189 Other specified counseling: Secondary | ICD-10-CM

## 2018-11-19 ENCOUNTER — Ambulatory Visit: Payer: Self-pay | Admitting: *Deleted

## 2018-11-19 NOTE — Telephone Encounter (Signed)
Patient went to the Dermatologist to have blue light treatments for pre skin cancer done but he could not finish the treatment. He is now experiencing severe headaches, nausea and vomiting. Please advise.   Patient was having treatments Monday- they were painful and patient had to stop. Patient has reached out to dermatologist and is awaiting response for medication- but thought he would call Dr Derrel Nip also. Patient informed that the office is closed at this time.  Patient has been in the bed with headache and nausea with vomiting since his procedure on Monday. Due to risk of dehydration ED protocol given- patient states he is not at that paoint yet- and will go to PCP house first. Patient will await a call in am from office.  Reason for Disposition . [1] MODERATE vomiting (e.g., 3 - 5 times/day) AND [2] age > 52  Answer Assessment - Initial Assessment Questions 1. VOMITING SEVERITY: "How many times have you vomited in the past 24 hours?"     - MILD:  1 - 2 times/day    - MODERATE: 3 - 5 times/day, decreased oral intake without significant weight loss or symptoms of dehydration    - SEVERE: 6 or more times/day, vomits everything or nearly everything, with significant weight loss, symptoms of dehydration      2-3 times 2. ONSET: "When did the vomiting begin?"      Monday - associated with headache and pain 3. FLUIDS: "What fluids or food have you vomited up today?" "Have you been able to keep any fluids down?"     Not a whole lot- diet Dr pepper 4. ABDOMINAL PAIN: "Are your having any abdominal pain?" If yes : "How bad is it and what does it feel like?" (e.g., crampy, dull, intermittent, constant)      Some- associated with nausea 5. DIARRHEA: "Is there any diarrhea?" If so, ask: "How many times today?"      Loose stools this morning 6. CONTACTS: "Is there anyone else in the family with the same symptoms?"      no 7. CAUSE: "What do you think is causing your vomiting?"     Patient thinks the  pain from the procedure started with the headache/pain 8. HYDRATION STATUS: "Any signs of dehydration?" (e.g., dry mouth [not only dry lips], too weak to stand) "When did you last urinate?"     No- patient states he is urinating normally 9. OTHER SYMPTOMS: "Do you have any other symptoms?" (e.g., fever, headache, vertigo, vomiting blood or coffee grounds, recent head injury)     Headache- migraine 10. PREGNANCY: "Is there any chance you are pregnant?" "When was your last menstrual period?"       n/a  Protocols used: [facility]

## 2018-11-20 NOTE — Telephone Encounter (Signed)
Called and spoke to patient.  Patient did not go to the ED yesterday.  Patient said that his dermatologist called in a medicine to the pharmacy for nausea and vomiting on yesterday.  Patient said that the vomiting has stopped and he has not vomited since yesterday.  Patient said that he still has a headache but denies having any other symptoms.  Informed patient that PCP had no virtual appointments available today.  Offered patient a virtual appointment with a nurse practitioner.  Patient declined stating he was feeling better and thought he would be ok now.  Patient said that he drank Gatorade yesterday and is now able to keep clear liquids down.  Advised patient to go to ED if vomiting reoccurs and he cannot keep liquids down to prevent dehydration.    Notes will be forwarded to PCP for review.

## 2019-01-01 ENCOUNTER — Other Ambulatory Visit: Payer: Self-pay | Admitting: Internal Medicine

## 2019-01-01 DIAGNOSIS — R0609 Other forms of dyspnea: Secondary | ICD-10-CM

## 2019-02-08 ENCOUNTER — Other Ambulatory Visit: Payer: Self-pay | Admitting: Internal Medicine

## 2019-02-09 ENCOUNTER — Other Ambulatory Visit: Payer: Self-pay

## 2019-02-09 DIAGNOSIS — E785 Hyperlipidemia, unspecified: Secondary | ICD-10-CM

## 2019-02-09 NOTE — Progress Notes (Signed)
          [facility] would like for this pt to have a CT CA Score   Dr Nehemiah Massed to call results Johnsie Cancel to read.   DX Hyperlipidemia

## 2019-03-13 ENCOUNTER — Ambulatory Visit (INDEPENDENT_AMBULATORY_CARE_PROVIDER_SITE_OTHER)
Admission: RE | Admit: 2019-03-13 | Discharge: 2019-03-13 | Disposition: A | Discharge status: Home / Self Care | Payer: BC Managed Care – PPO | Source: Ambulatory Visit | Attending: Cardiovascular Disease | Admitting: Cardiovascular Disease

## 2019-03-13 ENCOUNTER — Other Ambulatory Visit: Payer: Self-pay

## 2019-03-13 DIAGNOSIS — E785 Hyperlipidemia, unspecified: Secondary | ICD-10-CM

## 2019-03-18 ENCOUNTER — Other Ambulatory Visit: Payer: Self-pay

## 2019-03-18 DIAGNOSIS — Z20822 Contact with and (suspected) exposure to covid-19: Secondary | ICD-10-CM

## 2019-03-19 LAB — NOVEL CORONAVIRUS, NAA: SARS-CoV-2, NAA: NOT DETECTED

## 2019-05-05 ENCOUNTER — Other Ambulatory Visit: Payer: Self-pay | Admitting: Internal Medicine

## 2019-05-05 MED ORDER — ONDANSETRON 4 MG PO TBDP: 4.0000 mg | ORAL_TABLET | Freq: Three times a day (TID) | ORAL | 0 refills | Status: DC | PRN

## 2019-06-05 ENCOUNTER — Encounter: Payer: Self-pay | Admitting: Internal Medicine

## 2019-06-05 ENCOUNTER — Ambulatory Visit (INDEPENDENT_AMBULATORY_CARE_PROVIDER_SITE_OTHER): Payer: BC Managed Care – PPO | Admitting: Internal Medicine

## 2019-06-05 ENCOUNTER — Other Ambulatory Visit: Payer: Self-pay

## 2019-06-05 DIAGNOSIS — R1030 Lower abdominal pain, unspecified: Secondary | ICD-10-CM | POA: Diagnosis not present

## 2019-06-05 MED ORDER — AMOXICILLIN-POT CLAVULANATE 875-125 MG PO TABS: 1.0000 | ORAL_TABLET | Freq: Two times a day (BID) | ORAL | 0 refills | Status: DC

## 2019-06-05 NOTE — Progress Notes (Signed)
Virtual Visit via [email or web address]  This visit type was conducted due to national recommendations for restrictions regarding the COVID-19 pandemic (e.g. social distancing).  This format is felt to be most appropriate for this patient at this time.  All issues noted in this document were discussed and addressed.  No physical exam was performed (except for noted visual exam findings with Video Visits).   I connected with@ on 06/05/19 at  3:30 PM EST by a video enabled telemedicine application and verified that I am speaking with the correct person using two identifiers. Location patient: home Location provider: work or home office Persons participating in the virtual visit: patient, provider  I discussed the limitations, risks, security and privacy concerns of performing an evaluation and management service by telephone and the availability of in person appointments. I also discussed with the patient that there may be a patient responsible charge related to this service. The patient expressed understanding and agreed to proceed.  Reason for visit: abdominal pain, bloating , pressure  HPI:  64 yr old with history of recurrent diverticulitis s/p colonic resection  Presents with abd pain described as stabbing quality,  Intermittent  Accompanied by bloating and pressure.  Symptoms have been present  since Dec 25  After an episode of back pain .  Started with waking up  with morning nausea, followed by dain across the lower abdomen at the umbilical level .  Stools have been small caliber. Pain is reminiscent of prior attacks of diverticulitis,  But he has not had an episode since her surgical resection in 2012.  He denies fevers,  Diarrhea and vomiting .  Last colonoscopy was in 2016  And noted sigmoid diverticulosis  And polyps 5 yr follow up 2021 .  He has not modified his diet or taken any antibiotics in several months    ROS: See pertinent positives and negatives per HPI.  Past Medical History:  Diagnosis  Date  . Chronic insomnia 03/26/2014   Managed with generic ambien.     . 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 Constipation 02/22/2015  . Diverticulitis   . Diverticulitis of sigmoid colon 02/04/2011  . Essential hypertension 04/27/2014   related to event. no longer on medicines.  . Hepatic steatosis 02/22/2015  . Left rotator cuff tear 10/30/2013  . Reflux esophagitis 09/10/2012  . Subacromial bursitis 09/18/2013  . Urinary retention due to benign prostatic hyperplasia 04/27/2014  . Vertigo    3 episodes.  last one approx 6 mos ago  . Wears contact lenses     Past Surgical History:  Procedure Laterality Date  . COLON SURGERY  August 2012   sigmoid colectomy, diverticulitis  . COLONOSCOPY WITH PROPOFOL N/A 05/06/2015   Procedure: COLONOSCOPY WITH PROPOFOL;  Surgeon: Lucilla Lame, MD;  Location: [place];  Service: Endoscopy;  Laterality: N/A;  . EYE SURGERY     tear duct repair   . POLYPECTOMY  05/06/2015   Procedure: POLYPECTOMY;  Surgeon: Lucilla Lame, MD;  Location: [place];  Service: Endoscopy;;  . RHINOPLASTY  1992   deviated septum due to volleyball  . RHINOPLASTY  1992   foe deviated septm, traumatic  [facility]   . ROTATOR CUFF REPAIR  1991   Dr. Marry Guan  . SHOULDER ARTHROSCOPY W/ ROTATOR CUFF REPAIR  1991   [facility]    Family History  Problem Relation Age of Onset  . Diabetes Mother   . Mental illness Mother        Dementia  .  COPD Father   . Hyperlipidemia Father   . Mental illness Father        Parkinson's Disease    SOCIAL HX:  reports that he has never smoked. He has never used smokeless tobacco. He reports current alcohol use. He reports that he does not use drugs.   Current Outpatient Medications:  .  B Complex-C (B-COMPLEX WITH VITAMIN C) tablet, Take 1 tablet by mouth daily., Disp: , Rfl:  .  benzonatate (TESSALON) 200 MG capsule, Take 1 capsule (200 mg total) by mouth 2 (two) times daily as needed for cough., Disp: 30 capsule,  Rfl: 0 .  clobetasol (TEMOVATE) 0.05 % external solution, MIX WITH A JAR OF CERAVE CREAM AND  APP EXT AA BID, Disp: , Rfl:  .  diclofenac (VOLTAREN) 75 MG EC tablet, Take 1 tablet (75 mg total) by mouth 2 (two) times daily., Disp: 60 tablet, Rfl: 3 .  hydrOXYzine (ATARAX/VISTARIL) 25 MG tablet, TK 1 T PO HS PRF ITCHING. MAY MAKE DROWSY, Disp: , Rfl:  .  ondansetron (ZOFRAN ODT) 4 MG disintegrating tablet, Take 1 tablet (4 mg total) by mouth every 8 (eight) hours as needed for nausea or vomiting., Disp: 20 tablet, Rfl: 0 .  zolpidem (AMBIEN) 10 MG tablet, TAKE ONE TABLET BY MOUTH EVERY NIGHT AT BEDTIME AS NEEDED FOR SLEEP, Disp: 30 tablet, Rfl: 4  EXAM:  VITALS per patient if applicable:  GENERAL: alert, oriented, appears well and in no acute distress  HEENT: atraumatic, conjunttiva clear, no obvious abnormalities on inspection of external nose and ears  NECK: normal movements of the head and neck  LUNGS: on inspection no signs of respiratory distress, breathing rate appears normal, no obvious gross SOB, gasping or wheezing  CV: no obvious cyanosis  MS: moves all visible extremities without noticeable abnormality  PSYCH/NEURO: pleasant and cooperative, no obvious depression or anxiety, speech and thought processing grossly intact  ASSESSMENT AND PLAN:  Discussed the following assessment and plan:  No diagnosis found.  No problem-specific Assessment & Plan notes found for this encounter.    I discussed the assessment and treatment plan with the patient. The patient was provided an opportunity to ask questions and all were answered. The patient agreed with the plan and demonstrated an understanding of the instructions.   The patient was advised to call back or seek an in-person evaluation if the symptoms worsen or if the condition fails to improve as anticipated. Crecencio Mc, MD

## 2019-06-07 DIAGNOSIS — R1030 Lower abdominal pain, unspecified: Secondary | ICD-10-CM | POA: Insufficient documentation

## 2019-06-07 NOTE — Assessment & Plan Note (Addendum)
Give its location will evaluate for diverticultiis ,  Advised to start Augmentin , stop eating and use clear liquid diet, and metamucil for the constipation  CT scan ordered for next Friday

## 2019-06-12 ENCOUNTER — Other Ambulatory Visit: Payer: Self-pay

## 2019-06-12 ENCOUNTER — Ambulatory Visit
Admission: RE | Admit: 2019-06-12 | Discharge: 2019-06-12 | Disposition: A | Discharge status: Home / Self Care | Payer: BC Managed Care – PPO | Source: Ambulatory Visit | Attending: Internal Medicine | Admitting: Internal Medicine

## 2019-06-12 DIAGNOSIS — R1030 Lower abdominal pain, unspecified: Secondary | ICD-10-CM | POA: Diagnosis not present

## 2019-06-12 LAB — POCT I-STAT CREATININE: Creatinine, Ser: 1.3 mg/dL — ABNORMAL HIGH (ref 0.61–1.24)

## 2019-06-12 MED ORDER — IOHEXOL 300 MG/ML  SOLN
100.0000 mL | Freq: Once | INTRAMUSCULAR | Status: AC | PRN
  Administered 2019-06-12: 14:00:00 100 mL via INTRAVENOUS

## 2019-06-17 NOTE — Telephone Encounter (Signed)
After reviewing CT and with pts symptoms now - increased pain with "nothing passing through", I feel he needs to be reevaluated.

## 2019-06-17 NOTE — Telephone Encounter (Signed)
Patient says he will not go anywhere else scheduled says he had a small bowel movement scheduled appt for 8 AM Monday. Dr. Derrel Nip.

## 2019-06-17 NOTE — Telephone Encounter (Signed)
Left message to call office

## 2019-06-17 NOTE — Telephone Encounter (Signed)
Patient still having pain with diverticulitis he says Flagyl and Cipro is what always worked in the past CT scan per Derrel Nip note shows mild Diverticulitis, patient has tried clear liquids and modified diet for a week but still having pain in lower left quadrant and still not passing stool as should . Patient wondering can he get Flagyl and cipro.

## 2019-06-18 ENCOUNTER — Telehealth: Payer: Self-pay | Admitting: Internal Medicine

## 2019-06-18 NOTE — Telephone Encounter (Signed)
Please call and confirm pt doing ok.  I will send information to Dr Derrel Nip.  If pain continues, needs to be seen - to confirm etiology.

## 2019-06-18 NOTE — Telephone Encounter (Signed)
Seth Doyle,  I'm sorry you having such a hard time.  Your CT scan does not suggest that you are having a flare of diverticulitis, based on a comparison with the 2016 CT you had.  If it is, it's very mild,  And antibiotics should be a last resort since the only symptom you are having is constipation.  Your insurance will not cover lactulose,  So I recommend using miralax in water and magnesium  Capsules (400 mg once daily) to get your bowels moving    Regards,   Deborra Medina, MD

## 2019-06-22 ENCOUNTER — Ambulatory Visit (INDEPENDENT_AMBULATORY_CARE_PROVIDER_SITE_OTHER): Payer: BC Managed Care – PPO | Admitting: Internal Medicine

## 2019-06-22 ENCOUNTER — Encounter: Payer: Self-pay | Admitting: Internal Medicine

## 2019-06-22 ENCOUNTER — Other Ambulatory Visit: Payer: Self-pay

## 2019-06-22 VITALS — BP 134/76 | Ht 71.0 in | Wt 203.0 lb

## 2019-06-22 DIAGNOSIS — K5730 Diverticulosis of large intestine without perforation or abscess without bleeding: Secondary | ICD-10-CM | POA: Diagnosis not present

## 2019-06-22 DIAGNOSIS — R1030 Lower abdominal pain, unspecified: Secondary | ICD-10-CM

## 2019-06-22 DIAGNOSIS — Z86010 Personal history of colonic polyps: Secondary | ICD-10-CM | POA: Diagnosis not present

## 2019-06-22 NOTE — Assessment & Plan Note (Signed)
He is due for follow up colonoscopy in 2021.  Referral to [facility] underway.

## 2019-06-22 NOTE — Progress Notes (Signed)
Virtual Visit via [email or web address]  This visit type was conducted due to national recommendations for restrictions regarding the COVID-19 pandemic (e.g. social distancing).  This format is felt to be most appropriate for this patient at this time.  All issues noted in this document were discussed and addressed.  No physical exam was performed (except for noted visual exam findings with Video Visits).   I connected with@ on 06/22/19 at  8:00 AM EST by a video enabled telemedicine application and verified that I am speaking with the correct person using two identifiers. Location patient: home Location provider: work or home office Persons participating in the virtual visit: patient, provider  I discussed the limitations, risks, security and privacy concerns of performing an evaluation and management service by telephone and the availability of in person appointments. I also discussed with the patient that there may be a patient responsible charge related to this service. The patient expressed understanding and agreed to proceed.  Reason for visit: follow up on lower abdominal pain   HPI:  64 yr old male with history of diverticulitis , s/p colonic resection in 2018 , recent prolonged period of mild lower abdominal discomfort evaluated with CT scan which showed no evidence of acute diverticulitis,  Treated with miralax and altered diet presents for 2 week follow up.  Patient feels back to normal  After stating that the abdominal pain was worse 8 days ago, and gradually subsided over the next several days, on a clear liquid diet and use of miralax. Stools are back to normal.  Has resumed normal diet with recurrence of pain,  But appetite has been diminished because he feels distended and bloated if he eats too much. Denies pain, fever, hematochezia.   ROS: See pertinent positives and negatives per HPI.  Past Medical History:  Diagnosis Date  . Chronic insomnia 03/26/2014   Managed with generic ambien.     .  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 Constipation 02/22/2015  . Diverticulitis   . Diverticulitis of sigmoid colon 02/04/2011  . Essential hypertension 04/27/2014   related to event. no longer on medicines.  . Hepatic steatosis 02/22/2015  . Left rotator cuff tear 10/30/2013  . Reflux esophagitis 09/10/2012  . Subacromial bursitis 09/18/2013  . Urinary retention due to benign prostatic hyperplasia 04/27/2014  . Vertigo    3 episodes.  last one approx 6 mos ago  . Wears contact lenses     Past Surgical History:  Procedure Laterality Date  . COLON SURGERY  August 2012   sigmoid colectomy, diverticulitis  . COLONOSCOPY WITH PROPOFOL N/A 05/06/2015   Procedure: COLONOSCOPY WITH PROPOFOL;  Surgeon: Lucilla Lame, MD;  Location: [place];  Service: Endoscopy;  Laterality: N/A;  . EYE SURGERY     tear duct repair   . POLYPECTOMY  05/06/2015   Procedure: POLYPECTOMY;  Surgeon: Lucilla Lame, MD;  Location: [place];  Service: Endoscopy;;  . RHINOPLASTY  1992   deviated septum due to volleyball  . RHINOPLASTY  1992   foe deviated septm, traumatic  [facility]   . ROTATOR CUFF REPAIR  1991   Dr. Marry Guan  . SHOULDER ARTHROSCOPY W/ ROTATOR CUFF REPAIR  1991   [facility]    Family History  Problem Relation Age of Onset  . Diabetes Mother   . Mental illness Mother        Dementia  . COPD Father   . Hyperlipidemia Father   . Mental illness Father  Parkinson's Disease    SOCIAL HX:  reports that he has never smoked. He has never used smokeless tobacco. He reports current alcohol use. He reports that he does not use drugs.   Current Outpatient Medications:  .  B Complex-C (B-COMPLEX WITH VITAMIN C) tablet, Take 1 tablet by mouth daily., Disp: , Rfl:  .  benzonatate (TESSALON) 200 MG capsule, Take 1 capsule (200 mg total) by mouth 2 (two) times daily as needed for cough., Disp: 30 capsule, Rfl: 0 .  clobetasol (TEMOVATE) 0.05 % external solution, MIX WITH A JAR OF  CERAVE CREAM AND  APP EXT AA BID, Disp: , Rfl:  .  diclofenac (VOLTAREN) 75 MG EC tablet, Take 1 tablet (75 mg total) by mouth 2 (two) times daily., Disp: 60 tablet, Rfl: 3 .  hydrOXYzine (ATARAX/VISTARIL) 25 MG tablet, TK 1 T PO HS PRF ITCHING. MAY MAKE DROWSY, Disp: , Rfl:  .  ondansetron (ZOFRAN ODT) 4 MG disintegrating tablet, Take 1 tablet (4 mg total) by mouth every 8 (eight) hours as needed for nausea or vomiting., Disp: 20 tablet, Rfl: 0 .  zolpidem (AMBIEN) 10 MG tablet, TAKE ONE TABLET BY MOUTH EVERY NIGHT AT BEDTIME AS NEEDED FOR SLEEP, Disp: 30 tablet, Rfl: 4  EXAM:  VITALS per patient if applicable:  GENERAL: alert, oriented, appears well and in no acute distress  HEENT: atraumatic, conjunttiva clear, no obvious abnormalities on inspection of external nose and ears  NECK: normal movements of the head and neck  LUNGS: on inspection no signs of respiratory distress, breathing rate appears normal, no obvious gross SOB, gasping or wheezing  CV: no obvious cyanosis  MS: moves all visible extremities without noticeable abnormality  PSYCH/NEURO: pleasant and cooperative, no obvious depression or anxiety, speech and thought processing grossly intact  ASSESSMENT AND PLAN:  Discussed the following assessment and plan:  Diverticulosis of colon without hemorrhage - Plan: Ambulatory referral to Gastroenterology  Intermittent lower abdominal pain - Plan: Ambulatory referral to Gastroenterology  Personal history of colonic polyps - Plan: Ambulatory referral to Gastroenterology  Intermittent lower abdominal pain Recent episode of lower abdominal pain without fevers,  Accompanied by  Constipation,  Has now resolved without use of antibiotics.   CT scan was not conclusive for diverticulitis. , but he does have sigmoid and rectal diverticulum by 2016 colonoscopy  .  Colonic resection for recurrent diverticulitis was done in 2012 by Job Founds .   Personal history of colonic  polyps He is due for follow up colonoscopy in 2021.  Referral to [facility] underway.    I discussed the assessment and treatment plan with the patient. The patient was provided an opportunity to ask questions and all were answered. The patient agreed with the plan and demonstrated an understanding of the instructions.   The patient was advised to call back or seek an in-person evaluation if the symptoms worsen or if the condition fails to improve as anticipated.   I provided  30 minutes of non-face-to-face time during this encounter reviewing patient's current problems and past procedures/imaging studies, providing counseling on the above mentioned problems , and coordination  of care .   Crecencio Mc, MD

## 2019-06-22 NOTE — Assessment & Plan Note (Addendum)
Recent episode of lower abdominal pain without fevers,  Accompanied by  Constipation,  Has now resolved without use of antibiotics.   CT scan was not conclusive for diverticulitis. , but he does have sigmoid and rectal diverticulum by 2016 colonoscopy  .  Colonic resection for recurrent diverticulitis was done in 2012 by Job Founds .

## 2019-07-09 ENCOUNTER — Ambulatory Visit: Payer: BC Managed Care – PPO | Admitting: Gastroenterology

## 2019-07-16 ENCOUNTER — Ambulatory Visit: Payer: BC Managed Care – PPO | Admitting: Gastroenterology

## 2019-07-16 ENCOUNTER — Other Ambulatory Visit: Payer: Self-pay

## 2019-07-16 ENCOUNTER — Encounter: Payer: Self-pay | Admitting: Gastroenterology

## 2019-07-16 VITALS — BP 142/92 | HR 98 | Ht 71.0 in | Wt 214.2 lb

## 2019-07-16 DIAGNOSIS — G8929 Other chronic pain: Secondary | ICD-10-CM

## 2019-07-16 DIAGNOSIS — R1031 Right lower quadrant pain: Secondary | ICD-10-CM | POA: Diagnosis not present

## 2019-07-16 NOTE — Progress Notes (Signed)
Gastroenterology Consultation  Referring Provider:     Crecencio Mc, MD Primary Care Physician:  Crecencio Mc, MD Primary Gastroenterologist:  Dr. Allen Norris     Reason for Consultation:     Lower abdominal pain        HPI:   Seth Doyle is a 64 y.o. y/o male referred for consultation & management of lower abdominal pain by Dr. Derrel Nip, Aris Everts, MD.  This patient comes in today with a report of some lower abdominal pain that he states was around the time of Christmas.  The patient thought he was having diverticulitis although he has had surgery to remove part of his colon for diverticulitis in the past.  He was treated with antibiotics and states that his symptoms went away.  He is not sure if it was really diverticulitis or what was causing his discomfort.  The patient has had no further abdominal pain since then.  He did follow-up with his primary care provider who suggested the patient come see me.  The patient has not had a repeat colonoscopy although he has a history of colon polyps that were adenomas in the past.  There is no report of any unexplained weight loss nausea vomiting fevers or chills.  Past Medical History:  Diagnosis Date  . Chronic insomnia 03/26/2014   Managed with generic ambien.     . 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 Constipation 02/22/2015  . Diverticulitis   . Diverticulitis of sigmoid colon 02/04/2011  . Essential hypertension 04/27/2014   related to event. no longer on medicines.  . Hepatic steatosis 02/22/2015  . Left rotator cuff tear 10/30/2013  . Reflux esophagitis 09/10/2012  . Subacromial bursitis 09/18/2013  . Urinary retention due to benign prostatic hyperplasia 04/27/2014  . Vertigo    3 episodes.  last one approx 6 mos ago  . Wears contact lenses     Past Surgical History:  Procedure Laterality Date  . COLON SURGERY  August 2012   sigmoid colectomy, diverticulitis  . COLONOSCOPY WITH PROPOFOL N/A 05/06/2015   Procedure:  COLONOSCOPY WITH PROPOFOL;  Surgeon: Lucilla Lame, MD;  Location: [place];  Service: Endoscopy;  Laterality: N/A;  . EYE SURGERY     tear duct repair   . POLYPECTOMY  05/06/2015   Procedure: POLYPECTOMY;  Surgeon: Lucilla Lame, MD;  Location: [place];  Service: Endoscopy;;  . RHINOPLASTY  1992   deviated septum due to volleyball  . RHINOPLASTY  1992   foe deviated septm, traumatic  [facility]   . ROTATOR CUFF REPAIR  1991   Dr. Marry Guan  . SHOULDER ARTHROSCOPY W/ ROTATOR CUFF REPAIR  1991   [facility]    Prior to Admission medications   Medication Sig Start Date End Date Taking? Authorizing Provider  B Complex-C (B-COMPLEX WITH VITAMIN C) tablet Take 1 tablet by mouth daily.   Yes [provider]  clobetasol (TEMOVATE) 0.05 % external solution MIX WITH A JAR OF CERAVE CREAM AND  APP EXT AA BID 08/28/18  Yes [provider]  diclofenac (VOLTAREN) 75 MG EC tablet Take 1 tablet (75 mg total) by mouth 2 (two) times daily. 06/30/18  Yes Crecencio Mc, MD  hydrOXYzine (ATARAX/VISTARIL) 25 MG tablet TK 1 T PO HS PRF ITCHING. MAY MAKE DROWSY 08/28/18  Yes [provider]  ondansetron (ZOFRAN ODT) 4 MG disintegrating tablet Take 1 tablet (4 mg total) by mouth every 8 (eight) hours as needed for nausea or  vomiting. 05/05/19  Yes Crecencio Mc, MD  zolpidem (AMBIEN) 10 MG tablet TAKE ONE TABLET BY MOUTH EVERY NIGHT AT BEDTIME AS NEEDED FOR SLEEP 02/10/19  Yes Crecencio Mc, MD    Family History  Problem Relation Age of Onset  . Diabetes Mother   . Mental illness Mother        Dementia  . COPD Father   . Hyperlipidemia Father   . Mental illness Father        Parkinson's Disease     Social History   Tobacco Use  . Smoking status: Never Smoker  . Smokeless tobacco: Never Used  Substance Use Topics  . Alcohol use: Yes    Comment: rare - once /year  . Drug use: No    Allergies as of 07/16/2019 - Review Complete 07/16/2019  Allergen Reaction Noted   . Wellbutrin [bupropion] Itching 07/02/2014    Review of Systems:    All systems reviewed and negative except where noted in HPI.   Physical Exam:  BP (!) 142/92   Pulse 98   Ht 5\' 11"  (1.803 m)   Wt 214 lb 3.2 oz (97.2 kg)   BMI 29.87 kg/m  No LMP for male patient. General:   Alert,  Well-developed, well-nourished, pleasant and cooperative in NAD Head:  Normocephalic and atraumatic. Eyes:  Sclera clear, no icterus.   Conjunctiva pink. Ears:  Normal auditory acuity. Neck:  Supple; no masses or thyromegaly. Lungs:  Respirations even and unlabored.  Clear throughout to auscultation.   No wheezes, crackles, or rhonchi. No acute distress. Heart:  Regular rate and rhythm; no murmurs, clicks, rubs, or gallops. Abdomen:  Normal bowel sounds.  No bruits.  Soft, non-tender and non-distended without masses, hepatosplenomegaly or hernias noted.  No guarding or rebound tenderness.  Negative Carnett sign.   Rectal:  Deferred.  Pulses:  Normal pulses noted. Extremities:  No clubbing or edema.  No cyanosis. Neurologic:  Alert and oriented x3;  grossly normal neurologically. Skin:  Intact without significant lesions or rashes.  No jaundice. Lymph Nodes:  No significant cervical adenopathy. Psych:  Alert and cooperative. Normal mood and affect.  Imaging Studies: No results found.  Assessment and Plan:   Seth Doyle is a 64 y.o. y/o male who comes in today with a history of abdominal pain that was treated with antibiotics.  The patient has had surgery for diverticulosis/diverticulitis in the past.  The patient now is reporting that he is asymptomatic but does have a history of colon polyps.  The patient will be set up for a colonoscopy for surveillance purposes due to his history of colon polyps.  The patient has been explained the plan and agrees with it.    Lucilla Lame, MD. Marval Regal    Note: This dictation was prepared with Dragon dictation along with smaller phrase technology. Any  transcriptional errors that result from this process are unintentional.

## 2019-07-17 ENCOUNTER — Ambulatory Visit: Payer: BC Managed Care – PPO | Admitting: Gastroenterology

## 2019-07-17 ENCOUNTER — Other Ambulatory Visit: Payer: Self-pay

## 2019-07-17 DIAGNOSIS — Z86010 Personal history of colonic polyps: Secondary | ICD-10-CM

## 2019-08-05 ENCOUNTER — Other Ambulatory Visit
Admission: RE | Admit: 2019-08-05 | Discharge: 2019-08-05 | Disposition: A | Discharge status: Home / Self Care | Payer: BC Managed Care – PPO | Source: Ambulatory Visit | Attending: Gastroenterology | Admitting: Gastroenterology

## 2019-08-05 ENCOUNTER — Other Ambulatory Visit: Payer: Self-pay

## 2019-08-05 DIAGNOSIS — Z01812 Encounter for preprocedural laboratory examination: Secondary | ICD-10-CM | POA: Insufficient documentation

## 2019-08-05 DIAGNOSIS — Z20822 Contact with and (suspected) exposure to covid-19: Secondary | ICD-10-CM | POA: Insufficient documentation

## 2019-08-05 LAB — SARS CORONAVIRUS 2 (TAT 6-24 HRS): SARS Coronavirus 2: NEGATIVE

## 2019-08-07 ENCOUNTER — Ambulatory Visit: Payer: BC Managed Care – PPO | Admitting: Family

## 2019-08-07 ENCOUNTER — Encounter
Admission: RE | Disposition: A | Discharge status: Home / Self Care | Payer: Self-pay | Source: Home / Self Care | Attending: Gastroenterology

## 2019-08-07 ENCOUNTER — Encounter: Payer: Self-pay | Admitting: Gastroenterology

## 2019-08-07 ENCOUNTER — Other Ambulatory Visit: Payer: Self-pay

## 2019-08-07 ENCOUNTER — Ambulatory Visit
Admission: RE | Admit: 2019-08-07 | Discharge: 2019-08-07 | Disposition: A | Discharge status: Home / Self Care | Payer: BC Managed Care – PPO | Attending: Gastroenterology | Admitting: Gastroenterology

## 2019-08-07 DIAGNOSIS — M1990 Unspecified osteoarthritis, unspecified site: Secondary | ICD-10-CM | POA: Diagnosis not present

## 2019-08-07 DIAGNOSIS — Z825 Family history of asthma and other chronic lower respiratory diseases: Secondary | ICD-10-CM | POA: Diagnosis not present

## 2019-08-07 DIAGNOSIS — I10 Essential (primary) hypertension: Secondary | ICD-10-CM | POA: Diagnosis not present

## 2019-08-07 DIAGNOSIS — Z980 Intestinal bypass and anastomosis status: Secondary | ICD-10-CM | POA: Insufficient documentation

## 2019-08-07 DIAGNOSIS — R42 Dizziness and giddiness: Secondary | ICD-10-CM | POA: Insufficient documentation

## 2019-08-07 DIAGNOSIS — Z818 Family history of other mental and behavioral disorders: Secondary | ICD-10-CM | POA: Diagnosis not present

## 2019-08-07 DIAGNOSIS — Z833 Family history of diabetes mellitus: Secondary | ICD-10-CM | POA: Diagnosis not present

## 2019-08-07 DIAGNOSIS — F5104 Psychophysiologic insomnia: Secondary | ICD-10-CM | POA: Diagnosis not present

## 2019-08-07 DIAGNOSIS — Z1211 Encounter for screening for malignant neoplasm of colon: Secondary | ICD-10-CM | POA: Diagnosis not present

## 2019-08-07 DIAGNOSIS — Z820 Family history of epilepsy and other diseases of the nervous system: Secondary | ICD-10-CM | POA: Diagnosis not present

## 2019-08-07 DIAGNOSIS — K640 First degree hemorrhoids: Secondary | ICD-10-CM | POA: Diagnosis not present

## 2019-08-07 DIAGNOSIS — K5730 Diverticulosis of large intestine without perforation or abscess without bleeding: Secondary | ICD-10-CM | POA: Insufficient documentation

## 2019-08-07 DIAGNOSIS — Z79899 Other long term (current) drug therapy: Secondary | ICD-10-CM | POA: Diagnosis not present

## 2019-08-07 DIAGNOSIS — Z86010 Personal history of colonic polyps: Secondary | ICD-10-CM | POA: Diagnosis not present

## 2019-08-07 DIAGNOSIS — D120 Benign neoplasm of cecum: Secondary | ICD-10-CM | POA: Diagnosis not present

## 2019-08-07 DIAGNOSIS — Z888 Allergy status to other drugs, medicaments and biological substances status: Secondary | ICD-10-CM | POA: Insufficient documentation

## 2019-08-07 HISTORY — PX: COLONOSCOPY WITH PROPOFOL: SHX5780

## 2019-08-07 SURGERY — COLONOSCOPY WITH PROPOFOL
Anesthesia: General

## 2019-08-07 MED ORDER — PROPOFOL 500 MG/50ML IV EMUL
INTRAVENOUS | Status: AC
  Filled 2019-08-07: qty 50

## 2019-08-07 MED ORDER — SODIUM CHLORIDE 0.9 % IV SOLN: INTRAVENOUS | Status: DC

## 2019-08-07 MED ORDER — PROPOFOL 10 MG/ML IV BOLUS
INTRAVENOUS | Status: DC | PRN
  Administered 2019-08-07: 140 ug/kg/min via INTRAVENOUS
  Administered 2019-08-07: 80 mg via INTRAVENOUS

## 2019-08-07 MED ORDER — ONDANSETRON HCL 4 MG/2ML IJ SOLN
INTRAMUSCULAR | Status: DC | PRN
  Administered 2019-08-07: 4 mg via INTRAVENOUS

## 2019-08-07 NOTE — Anesthesia Postprocedure Evaluation (Signed)
Anesthesia Post Note  Patient: Seth Doyle  Procedure(s) Performed: COLONOSCOPY WITH PROPOFOL (N/A )  Patient location during evaluation: Endoscopy Anesthesia Type: General Level of consciousness: awake and alert and oriented Pain management: pain level controlled Vital Signs Assessment: post-procedure vital signs reviewed and stable Respiratory status: spontaneous breathing Cardiovascular status: blood pressure returned to baseline Anesthetic complications: no     Last Vitals:  Vitals:   08/07/19 0830 08/07/19 0840  BP:  109/68  Pulse: 71 70  Resp: 15 (!) 9  Temp:    SpO2: 96% 97%    Last Pain:  Vitals:   08/07/19 0717  TempSrc: Temporal  PainSc: 0-No pain                 Lakeyta Vandenheuvel

## 2019-08-07 NOTE — Anesthesia Preprocedure Evaluation (Signed)
Anesthesia Evaluation  Patient identified by MRN, date of birth, ID band  Reviewed: Allergy & Precautions, H&P , NPO status , Patient's Chart, lab work & pertinent test results  Airway Mallampati: II  TM Distance: >3 FB Neck ROM: full    Dental no notable dental hx.    Pulmonary neg pulmonary ROS,    Pulmonary exam normal        Cardiovascular hypertension,  Rhythm:regular Rate:Normal     Neuro/Psych negative neurological ROS  negative psych ROS   GI/Hepatic Neg liver ROS,   Endo/Other  negative endocrine ROS  Renal/GU negative Renal ROS     Musculoskeletal  (+) Arthritis ,   Abdominal   Peds negative pediatric ROS (+)  Hematology negative hematology ROS (+)   Anesthesia Other Findings Past Medical History: 03/26/2014: Chronic insomnia     Comment:  Managed with generic ambien.    03/27/2013: CMC arthritis, thumb, degenerative 02/22/2015: Constipation No date: Diverticulitis 02/04/2011: Diverticulitis of sigmoid colon 04/27/2014: Essential hypertension     Comment:  related to event. no longer on medicines. 02/22/2015: Hepatic steatosis 10/30/2013: Left rotator cuff tear 09/10/2012: Reflux esophagitis 09/18/2013: Subacromial bursitis 04/27/2014: Urinary retention due to benign prostatic hyperplasia No date: Vertigo     Comment:  3 episodes.  last one approx 6 mos ago No date: Wears contact lenses  Reproductive/Obstetrics                             Anesthesia Physical  Anesthesia Plan  ASA: II  Anesthesia Plan: General   Post-op Pain Management:    Induction: Intravenous  PONV Risk Score and Plan: Propofol infusion  Airway Management Planned:   Additional Equipment:   Intra-op Plan:   Post-operative Plan:   Informed Consent: I have reviewed the patients History and Physical, chart, labs and discussed the procedure including the risks, benefits and alternatives for the  proposed anesthesia with the patient or authorized representative who has indicated his/her understanding and acceptance.       Plan Discussed with: CRNA  Anesthesia Plan Comments:         Anesthesia Quick Evaluation

## 2019-08-07 NOTE — Transfer of Care (Signed)
Immediate Anesthesia Transfer of Care Note  Patient: Seth Doyle  Procedure(s) Performed: COLONOSCOPY WITH PROPOFOL (N/A )  Patient Location: PACU  Anesthesia Type:General  Level of Consciousness: drowsy  Airway & Oxygen Therapy: Patient Spontanous Breathing  Post-op Assessment: Report given to RN and Post -op Vital signs reviewed and stable  Post vital signs: Reviewed and stable  Last Vitals:  Vitals Value Taken Time  BP 105/62 08/07/19 0811  Temp    Pulse 84 08/07/19 0813  Resp 19 08/07/19 0813  SpO2 94 % 08/07/19 0813  Vitals shown include unvalidated device data.  Last Pain:  Vitals:   08/07/19 0717  TempSrc: Temporal  PainSc: 0-No pain         Complications: No apparent anesthesia complications

## 2019-08-07 NOTE — H&P (Signed)
Lucilla Lame, MD [address] [place], [place] [address] Phone:[phone] Fax : [phone]  Primary Care Physician:  Crecencio Mc, MD Primary Gastroenterologist:  Dr. Allen Norris  Pre-Procedure History & Physical: HPI:  Seth Doyle is a 64 y.o. male is here for an colonoscopy.   Past Medical History:  Diagnosis Date  . Chronic insomnia 03/26/2014   Managed with generic ambien.     . 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 Constipation 02/22/2015  . Diverticulitis   . Diverticulitis of sigmoid colon 02/04/2011  . Essential hypertension 04/27/2014   related to event. no longer on medicines.  . Hepatic steatosis 02/22/2015  . Left rotator cuff tear 10/30/2013  . Reflux esophagitis 09/10/2012  . Subacromial bursitis 09/18/2013  . Urinary retention due to benign prostatic hyperplasia 04/27/2014  . Vertigo    3 episodes.  last one approx 6 mos ago  . Wears contact lenses     Past Surgical History:  Procedure Laterality Date  . COLON SURGERY  August 2012   sigmoid colectomy, diverticulitis  . COLONOSCOPY WITH PROPOFOL N/A 05/06/2015   Procedure: COLONOSCOPY WITH PROPOFOL;  Surgeon: Lucilla Lame, MD;  Location: [facility];  Service: Endoscopy;  Laterality: N/A;  . EYE SURGERY     tear duct repair   . POLYPECTOMY  05/06/2015   Procedure: POLYPECTOMY;  Surgeon: Lucilla Lame, MD;  Location: [facility];  Service: Endoscopy;;  . RHINOPLASTY  1992   deviated septum due to volleyball  . RHINOPLASTY  1992   foe deviated septm, traumatic  [place]   . ROTATOR CUFF REPAIR  1991   Dr. Marry Guan  . SHOULDER ARTHROSCOPY W/ ROTATOR CUFF REPAIR  1991   [facility]    Prior to Admission medications   Medication Sig Start Date End Date Taking? Authorizing Provider  ondansetron (ZOFRAN ODT) 4 MG disintegrating tablet Take 1 tablet (4 mg total) by mouth every 8 (eight) hours as needed for nausea or vomiting. 05/05/19  Yes Crecencio Mc, MD  zolpidem (AMBIEN) 10  MG tablet TAKE ONE TABLET BY MOUTH EVERY NIGHT AT BEDTIME AS NEEDED FOR SLEEP 02/10/19  Yes Crecencio Mc, MD  B Complex-C (B-COMPLEX WITH VITAMIN C) tablet Take 1 tablet by mouth daily.    [provider]  clobetasol (TEMOVATE) 0.05 % external solution MIX WITH A JAR OF CERAVE CREAM AND  APP EXT AA BID 08/28/18   [provider]  diclofenac (VOLTAREN) 75 MG EC tablet Take 1 tablet (75 mg total) by mouth 2 (two) times daily. Patient not taking: Reported on 08/07/2019 06/30/18   Crecencio Mc, MD  hydrOXYzine (ATARAX/VISTARIL) 25 MG tablet TK 1 T PO HS PRF ITCHING. MAY MAKE DROWSY 08/28/18   [provider]    Allergies as of 07/17/2019 - Review Complete 07/16/2019  Allergen Reaction Noted  . Wellbutrin [bupropion] Itching 07/02/2014    Family History  Problem Relation Age of Onset  . Diabetes Mother   . Mental illness Mother        Dementia  . COPD Father   . Hyperlipidemia Father   . Mental illness Father        Parkinson's Disease    Social History   Socioeconomic History  . Marital status: Married    Spouse name: Not on file  . Number of children: Not on file  . Years of education: Not on file  . Highest education level: Not on file  Occupational History  .  Not on file  Tobacco Use  . Smoking status: Never Smoker  . Smokeless tobacco: Never Used  Substance and Sexual Activity  . Alcohol use: Yes    Comment: rare - once /year  . Drug use: No  . Sexual activity: Yes  Other Topics Concern  . Not on file  Social History Narrative  . Not on file   Social Determinants of Health   Financial Resource Strain:   . Difficulty of Paying Living Expenses:   Food Insecurity:   . Worried About Charity fundraiser in the Last Year:   . Arboriculturist in the Last Year:   Transportation Needs:   . Film/video editor (Medical):   Marland Kitchen Lack of Transportation (Non-Medical):   Physical Activity:   . Days of Exercise per Week:   . Minutes of Exercise  per Session:   Stress:   . Feeling of Stress :   Social Connections:   . Frequency of Communication with Friends and Family:   . Frequency of Social Gatherings with Friends and Family:   . Attends Religious Services:   . Active Member of Clubs or Organizations:   . Attends Archivist Meetings:   Marland Kitchen Marital Status:   Intimate Partner Violence:   . Fear of Current or Ex-Partner:   . Emotionally Abused:   Marland Kitchen Physically Abused:   . Sexually Abused:     Review of Systems: See HPI, otherwise negative ROS  Physical Exam: BP 119/88   Pulse 89   Temp (!) 96.8 F (36 C) (Temporal)   Resp 18   Ht 5\' 11"  (1.803 m)   Wt 94.3 kg   SpO2 98%   BMI 29.01 kg/m  General:   Alert,  pleasant and cooperative in NAD Head:  Normocephalic and atraumatic. Neck:  Supple; no masses or thyromegaly. Lungs:  Clear throughout to auscultation.    Heart:  Regular rate and rhythm. Abdomen:  Soft, nontender and nondistended. Normal bowel sounds, without guarding, and without rebound.   Neurologic:  Alert and  oriented x4;  grossly normal neurologically.  Impression/Plan: Seth Doyle is here for an colonoscopy to be performed for history of adenomatous colon polyps  Risks, benefits, limitations, and alternatives regarding  colonoscopy have been reviewed with the patient.  Questions have been answered.  All parties agreeable.   Lucilla Lame, MD  08/07/2019, 7:47 AM

## 2019-08-07 NOTE — Op Note (Signed)
[facility] Gastroenterology Patient Name: Seth Doyle Procedure Date: 08/07/2019 7:52 AM MRN: [id number] Account #: [id number] Date of Birth: 03-14-1956 Admit Type: Outpatient Age: 64 Room: [facility] ENDO ROOM 4 Gender: Male Note Status: Finalized Procedure:             Colonoscopy Indications:           High risk colon cancer surveillance: Personal history                         of colonic polyps Providers:             Lucilla Lame MD, MD Referring MD:          Deborra Medina, MD (Referring MD) Medicines:             Propofol per Anesthesia Complications:         No immediate complications. Procedure:             Pre-Anesthesia Assessment:                        - Prior to the procedure, a History and Physical was                         performed, and patient medications and allergies were                         reviewed. The patient's tolerance of previous                         anesthesia was also reviewed. The risks and benefits                         of the procedure and the sedation options and risks                         were discussed with the patient. All questions were                         answered, and informed consent was obtained. Prior                         Anticoagulants: The patient has taken no previous                         anticoagulant or antiplatelet agents. ASA Grade                         Assessment: II - A patient with mild systemic disease.                         After reviewing the risks and benefits, the patient                         was deemed in satisfactory condition to undergo the                         procedure.  After obtaining informed consent, the colonoscope was                         passed under direct vision. Throughout the procedure,                         the patient's blood pressure, pulse, and oxygen                         saturations were monitored continuously. The             Colonoscope was introduced through the anus and                         advanced to the the cecum, identified by appendiceal                         orifice and ileocecal valve. The colonoscopy was                         performed without difficulty. The patient tolerated                         the procedure well. The quality of the bowel                         preparation was excellent. Findings:      The perianal and digital rectal examinations were normal.      A 2 mm polyp was found in the cecum. The polyp was sessile. The polyp       was removed with a cold biopsy forceps. Resection and retrieval were       complete.      There was evidence of a prior end-to-side colo-colonic anastomosis in       the sigmoid colon. This was patent and was characterized by healthy       appearing mucosa. The anastomosis was traversed.      A few small-mouthed diverticula were found in the entire colon.      Internal hemorrhoids were found during retroflexion. The hemorrhoids       were Grade I (internal hemorrhoids that do not prolapse). Impression:            - One 2 mm polyp in the cecum, removed with a cold                         biopsy forceps. Resected and retrieved.                        - Patent end-to-side colo-colonic anastomosis,                         characterized by healthy appearing mucosa.                        - Diverticulosis in the entire examined colon.                        - Internal hemorrhoids. Recommendation:        - Discharge patient to home.                        -  Resume previous diet.                        - Continue present medications.                        - Await pathology results.                        - Repeat colonoscopy in 5 years for surveillance. Procedure Code(s):     --- Professional ---                        [phone], Colonoscopy, flexible; with biopsy, single or                         multiple Diagnosis Code(s):     --- Professional  ---                        Z86.010, Personal history of colonic polyps                        K63.5, Polyp of colon CPT copyright 2019 [employer]. All rights reserved. The codes documented in this report are preliminary and upon coder review may  be revised to meet current compliance requirements. Lucilla Lame MD, MD 08/07/2019 8:09:19 AM This report has been signed electronically. Number of Addenda: 0 Note Initiated On: 08/07/2019 7:52 AM Scope Withdrawal Time: 0 hours 5 minutes 39 seconds  Total Procedure Duration: 0 hours 8 minutes 42 seconds  Estimated Blood Loss:  Estimated blood loss: none.      [facility]

## 2019-08-10 ENCOUNTER — Encounter: Payer: Self-pay | Admitting: *Deleted

## 2019-08-10 LAB — SURGICAL PATHOLOGY

## 2019-08-11 ENCOUNTER — Encounter: Payer: Self-pay | Admitting: Gastroenterology

## 2019-08-14 ENCOUNTER — Ambulatory Visit: Payer: BC Managed Care – PPO | Admitting: Dermatology

## 2019-08-14 ENCOUNTER — Other Ambulatory Visit: Payer: Self-pay

## 2019-08-14 DIAGNOSIS — L821 Other seborrheic keratosis: Secondary | ICD-10-CM | POA: Diagnosis not present

## 2019-08-14 DIAGNOSIS — L814 Other melanin hyperpigmentation: Secondary | ICD-10-CM

## 2019-08-14 DIAGNOSIS — L570 Actinic keratosis: Secondary | ICD-10-CM | POA: Diagnosis not present

## 2019-08-14 DIAGNOSIS — L578 Other skin changes due to chronic exposure to nonionizing radiation: Secondary | ICD-10-CM | POA: Diagnosis not present

## 2019-08-14 NOTE — Progress Notes (Addendum)
   Follow-Up Visit   Subjective  Seth Doyle is a 64 y.o. male who presents for the following: Other (Scaly spot on right temple x 1 month or more.).    The following portions of the chart were reviewed this encounter and updated as appropriate:     Review of Systems: No other skin or systemic complaints.  Objective  Well appearing patient in no apparent distress; mood and affect are within normal limits.  A focused examination was performed including Scalp, Face. Relevant physical exam findings are noted in the Assessment and Plan.  Objective  Left Forehead, Left Temple, Right Forehead (6), Right Temporal Scalp (3): Erythematous thin papules/macules with gritty scale.   Objective  Face, Scalp: Actinic changes.  Objective  Face, scalp: Scattered light brown macules  Objective  Crown: SK vs Lentigo - 2.0 x 1.5 cm waxy brown speckled patch.  Images      Assessment & Plan  AK (actinic keratosis) (11) Left Temple; Left Forehead; Right Forehead (6); Right Temporal Scalp (3)  Destruction of lesion - Left Forehead, Left Temple, Right Forehead, Right Temporal Scalp  Destruction method: cryotherapy   Informed consent: discussed and consent obtained   Lesion destroyed using liquid nitrogen: Yes   Region frozen until ice ball extended beyond lesion: Yes   Outcome: patient tolerated procedure well with no complications   Post-procedure details: wound care instructions given    Actinic skin damage Face, Scalp  Recommend daily broad spectrum sunscreen SPF 30+ to sun-exposed areas, reapply every 2 hours as needed. Call for new or changing lesions.    Lentigines Face, scalp  Lentigines - Scattered tan macules - Discussed due to sun exposure - Benign, observe - Call for any changes   Seborrheic keratosis Crown  Photo today. Observe   Return in about 6 months (around 02/14/2020) for AKs, recheck SK of crown.   I, Ashok Cordia, CMA, am acting as scribe for  Brendolyn Patty, MD .  Documentation: I have reviewed the above documentation for accuracy and completeness, and I agree with the above.  Brendolyn Patty MD

## 2019-08-14 NOTE — Patient Instructions (Signed)

## 2019-08-18 ENCOUNTER — Encounter: Payer: Self-pay | Admitting: Emergency Medicine

## 2019-08-18 ENCOUNTER — Other Ambulatory Visit: Payer: Self-pay

## 2019-08-18 ENCOUNTER — Emergency Department: Payer: BC Managed Care – PPO

## 2019-08-18 ENCOUNTER — Emergency Department
Admission: EM | Admit: 2019-08-18 | Discharge: 2019-08-18 | Disposition: A | Discharge status: Home / Self Care | Payer: BC Managed Care – PPO | Attending: Emergency Medicine | Admitting: Emergency Medicine

## 2019-08-18 DIAGNOSIS — N200 Calculus of kidney: Secondary | ICD-10-CM | POA: Diagnosis not present

## 2019-08-18 DIAGNOSIS — Z9049 Acquired absence of other specified parts of digestive tract: Secondary | ICD-10-CM | POA: Diagnosis not present

## 2019-08-18 DIAGNOSIS — Z79899 Other long term (current) drug therapy: Secondary | ICD-10-CM | POA: Insufficient documentation

## 2019-08-18 DIAGNOSIS — R109 Unspecified abdominal pain: Secondary | ICD-10-CM | POA: Diagnosis present

## 2019-08-18 DIAGNOSIS — D120 Benign neoplasm of cecum: Secondary | ICD-10-CM | POA: Diagnosis not present

## 2019-08-18 DIAGNOSIS — I10 Essential (primary) hypertension: Secondary | ICD-10-CM | POA: Insufficient documentation

## 2019-08-18 LAB — BPAM RBC
Blood Product Expiration Date: 202104152359
Blood Product Expiration Date: 202104152359
ISSUE DATE / TIME: 202103301448
ISSUE DATE / TIME: 202103301448
Unit Type and Rh: 5100
Unit Type and Rh: 5100

## 2019-08-18 LAB — URINALYSIS, COMPLETE (UACMP) WITH MICROSCOPIC
Bacteria, UA: NONE SEEN
Bilirubin Urine: NEGATIVE
Glucose, UA: NEGATIVE mg/dL
Ketones, ur: NEGATIVE mg/dL
Leukocytes,Ua: NEGATIVE
Nitrite: NEGATIVE
Protein, ur: NEGATIVE mg/dL
RBC / HPF: >50 RBC/hpf — ABNORMAL HIGH (ref 0–5)
Specific Gravity, Urine: 1.041 — ABNORMAL HIGH (ref 1.005–1.030)
pH: 6 (ref 5.0–8.0)

## 2019-08-18 LAB — CBC
HCT: 32.1 % — ABNORMAL LOW (ref 39.0–52.0)
Hemoglobin: 10.2 g/dL — ABNORMAL LOW (ref 13.0–17.0)
MCH: 25.2 pg — ABNORMAL LOW (ref 26.0–34.0)
MCHC: 31.8 g/dL (ref 30.0–36.0)
MCV: 79.5 fL — ABNORMAL LOW (ref 80.0–100.0)
Platelets: 211 10*3/uL (ref 150–400)
RBC: 4.04 MIL/uL — ABNORMAL LOW (ref 4.22–5.81)
RDW: 14 % (ref 11.5–15.5)
WBC: 6 10*3/uL (ref 4.0–10.5)
nRBC: 0 % (ref 0.0–0.2)

## 2019-08-18 LAB — TYPE AND SCREEN
ABO/RH(D): O POS
Antibody Screen: NEGATIVE
Unit division: 0
Unit division: 0

## 2019-08-18 LAB — ABO/RH: ABO/RH(D): O POS

## 2019-08-18 LAB — PREPARE RBC (CROSSMATCH)

## 2019-08-18 MED ORDER — ONDANSETRON HCL 4 MG PO TABS: 4.0000 mg | ORAL_TABLET | Freq: Three times a day (TID) | ORAL | 0 refills | Status: DC | PRN

## 2019-08-18 MED ORDER — ONDANSETRON HCL 4 MG/2ML IJ SOLN
4.0000 mg | Freq: Once | INTRAMUSCULAR | Status: AC
  Administered 2019-08-18: 4 mg via INTRAVENOUS
  Filled 2019-08-18: qty 2

## 2019-08-18 MED ORDER — FENTANYL CITRATE (PF) 100 MCG/2ML IJ SOLN
INTRAMUSCULAR | Status: AC
  Filled 2019-08-18: qty 2

## 2019-08-18 MED ORDER — TAMSULOSIN HCL 0.4 MG PO CAPS: 0.4000 mg | ORAL_CAPSULE | Freq: Every day | ORAL | 0 refills | Status: DC

## 2019-08-18 MED ORDER — IOHEXOL 350 MG/ML SOLN
100.0000 mL | Freq: Once | INTRAVENOUS | Status: AC | PRN
  Administered 2019-08-18: 100 mL via INTRAVENOUS

## 2019-08-18 MED ORDER — SODIUM CHLORIDE 0.9 % IV SOLN: 10.0000 mL/h | Freq: Once | INTRAVENOUS | Status: DC

## 2019-08-18 MED ORDER — OXYCODONE-ACETAMINOPHEN 5-325 MG PO TABS: 1.0000 | ORAL_TABLET | Freq: Four times a day (QID) | ORAL | 0 refills | Status: DC | PRN

## 2019-08-18 MED ORDER — OXYCODONE-ACETAMINOPHEN 5-325 MG PO TABS
1.0000 | ORAL_TABLET | Freq: Once | ORAL | Status: AC
  Administered 2019-08-18: 1 via ORAL
  Filled 2019-08-18: qty 1

## 2019-08-18 MED ORDER — FENTANYL CITRATE (PF) 100 MCG/2ML IJ SOLN
100.0000 ug | Freq: Once | INTRAMUSCULAR | Status: AC
  Administered 2019-08-18: 15:00:00 100 ug via INTRAVENOUS

## 2019-08-18 MED ORDER — FENTANYL CITRATE (PF) 100 MCG/2ML IJ SOLN
50.0000 ug | INTRAMUSCULAR | Status: AC | PRN
  Administered 2019-08-18 (×2): 50 ug via INTRAVENOUS
  Filled 2019-08-18 (×2): qty 2

## 2019-08-18 NOTE — ED Triage Notes (Addendum)
Pt started with RLQ abdominal pain about 1.5 hours ago.  No NVD.  No fevers. No hx of similar pain.  Pain on exam to RUQ and RLQ but RUQ.  RUQ was worst pain.  Pt appears pale. .  Did not eat prior to pain starting.  No history of stones. Has never had abdominal surgery.  Pain is constant.

## 2019-08-18 NOTE — Discharge Instructions (Addendum)
Please seek medical attention for any high fevers, chest pain, shortness of breath, change in behavior, persistent vomiting, bloody stool or any other new or concerning symptoms.  

## 2019-08-18 NOTE — ED Notes (Signed)
ED Provider at bedside. 

## 2019-08-18 NOTE — ED Provider Notes (Signed)
[facility] Emergency Department Provider Note  Time seen: 2:43 PM  I have reviewed the triage vital signs and the nursing notes.   HISTORY  Chief Complaint Abdominal Pain   HPI Seth Doyle is a 64 y.o. male with a past medical history of diverticulitis status post partial colectomy, presents to the emergency department for acute onset of abdominal pain.  According to the patient he workout this morning shortly afterwards on the drive home developed acute onset of abdominal pain.  Patient states the pain has continued to worsen was 10/10 at home.  Upon arrival to the emergency department patient continues to state abdominal pain although somewhat improved from earlier.  No history of similar pain in the past per patient.  Patient does have history of partial colectomy due to diverticulitis.  Denies any fever.  Denies any recent illnesses.  States prior to working out this morning he felt his normal self.   Past Medical History:  Diagnosis Date  . Chronic insomnia 03/26/2014   Managed with generic ambien.     . 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 Constipation 02/22/2015  . Diverticulitis   . Diverticulitis of sigmoid colon 02/04/2011  . Essential hypertension 04/27/2014   related to event. no longer on medicines.  . Hepatic steatosis 02/22/2015  . Left rotator cuff tear 10/30/2013  . Reflux esophagitis 09/10/2012  . Subacromial bursitis 09/18/2013  . Urinary retention due to benign prostatic hyperplasia 04/27/2014  . Vertigo    3 episodes.  last one approx 6 mos ago  . Wears contact lenses     Patient Active Problem List   Diagnosis Date Noted  . Benign neoplasm of cecum   . Intermittent lower abdominal pain 06/07/2019  . Clicking of left hip [id number]  . Otitis media 05/26/2018  . Intermittent epigastric abdominal pain 09/22/2017  . Small intestinal bacterial overgrowth 12/09/2016  . Primary hypogonadism in male 07/03/2015  . Personal history  of colonic polyps   . Intestinal bypass or anastomosis status   . Benign neoplasm of transverse colon   . Hepatic steatosis 02/22/2015  . 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 Vitamin D deficiency 05/02/2014  . Urinary retention due to benign prostatic hyperplasia 04/27/2014  . History of hypertension 04/27/2014  . Encounter for preventive health examination 04/27/2014  . Chronic insomnia 03/26/2014  . 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 Reflux esophagitis 09/10/2012  . Diverticulosis of colon without hemorrhage 02/04/2011    Past Surgical History:  Procedure Laterality Date  . COLON SURGERY  August 2012   sigmoid colectomy, diverticulitis  . COLONOSCOPY WITH PROPOFOL N/A 05/06/2015   Procedure: COLONOSCOPY WITH PROPOFOL;  Surgeon: Lucilla Lame, MD;  Location: [place];  Service: Endoscopy;  Laterality: N/A;  . COLONOSCOPY WITH PROPOFOL N/A 08/07/2019   Procedure: COLONOSCOPY WITH PROPOFOL;  Surgeon: Lucilla Lame, MD;  Location: [facility] ENDOSCOPY;  Service: Endoscopy;  Laterality: N/A;  . EYE SURGERY     tear duct repair   . POLYPECTOMY  05/06/2015   Procedure: POLYPECTOMY;  Surgeon: Lucilla Lame, MD;  Location: [place];  Service: Endoscopy;;  . RHINOPLASTY  1992   deviated septum due to volleyball  . RHINOPLASTY  1992   foe deviated septm, traumatic  [place]   . ROTATOR CUFF REPAIR  1991   Dr. Marry Guan  . SHOULDER ARTHROSCOPY W/ ROTATOR CUFF REPAIR  1991   [place]    Prior to Admission medications   Medication Sig Start Date End  Date Taking? Authorizing Provider  B Complex-C (B-COMPLEX WITH VITAMIN C) tablet Take 1 tablet by mouth daily.    [provider]  clobetasol (TEMOVATE) 0.05 % external solution MIX WITH A JAR OF CERAVE CREAM AND  APP EXT AA BID 08/28/18   [provider]  diclofenac (VOLTAREN) 75 MG EC tablet Take 1 tablet (75 mg total) by mouth 2 (two) times daily. Patient not taking: Reported on 08/07/2019 06/30/18    Crecencio Mc, MD  hydrOXYzine (ATARAX/VISTARIL) 25 MG tablet TK 1 T PO HS PRF ITCHING. MAY MAKE DROWSY 08/28/18   [provider]  ondansetron (ZOFRAN ODT) 4 MG disintegrating tablet Take 1 tablet (4 mg total) by mouth every 8 (eight) hours as needed for nausea or vomiting. 05/05/19   Crecencio Mc, MD  zolpidem (AMBIEN) 10 MG tablet TAKE ONE TABLET BY MOUTH EVERY NIGHT AT BEDTIME AS NEEDED FOR SLEEP 02/10/19   Crecencio Mc, MD    Allergies  Allergen Reactions  . Wellbutrin [Bupropion] Itching    Family History  Problem Relation Age of Onset  . Diabetes Mother   . Mental illness Mother        Dementia  . COPD Father   . Hyperlipidemia Father   . Mental illness Father        Parkinson's Disease    Social History Social History   Tobacco Use  . Smoking status: Never Smoker  . Smokeless tobacco: Never Used  Substance Use Topics  . Alcohol use: Yes    Comment: rare - once /year  . Drug use: No    Review of Systems Constitutional: Negative for fever. Cardiovascular: Negative for chest pain. Respiratory: Negative for shortness of breath. Gastrointestinal: Positive for abdominal pain fairly diffuse.  Severe.  Negative for vomiting or diarrhea. Genitourinary: Negative for urinary compaints Musculoskeletal: Negative for musculoskeletal complaints Neurological: Negative for headache All other ROS negative  ____________________________________________   PHYSICAL EXAM:  VITAL SIGNS: ED Triage Vitals  Enc Vitals Group     BP 08/18/19 1318 (!) 109/55     Pulse Rate 08/18/19 1317 60     Resp 08/18/19 1317 16     Temp 08/18/19 1317 98.1 F (36.7 C)     Temp Source 08/18/19 1317 Oral     SpO2 08/18/19 1317 96 %     Weight 08/18/19 1318 200 lb (90.7 kg)     Height 08/18/19 1318 5\' 11"  (1.803 m)     Head Circumference --      Peak Flow --      Pain Score 08/18/19 1318 10     Pain Loc --      Pain Edu? --      Excl. in [place]? --    Constitutional: Alert and  oriented. Well appearing and in no distress. Eyes: Normal exam ENT      Head: Normocephalic and atraumatic.      Mouth/Throat: Mucous membranes are moist. Cardiovascular: Normal rate, regular rhythm.  Respiratory: Normal respiratory effort without tachypnea nor retractions. Breath sounds are clear  Gastrointestinal: Soft, no distention.  Moderate tenderness throughout abdominal exam with mild guarding in all quadrants. Musculoskeletal: Nontender with normal range of motion in all extremities.  Neurologic:  Normal speech and language. No gross focal neurologic deficits  Skin:  Skin is warm, dry and intact.  Psychiatric: Mood and affect are normal.  ____________________________________________    EKG  EKG viewed and interpreted by myself shows sinus bradycardia 56 bpm with a  narrow QRS, normal axis, normal intervals, nonspecific ST changes.  ____________________________________________    RADIOLOGY  Official CT read pending.  ____________________________________________   INITIAL IMPRESSION / ASSESSMENT AND PLAN / ED COURSE  Pertinent labs & imaging results that were available during my care of the patient were reviewed by me and considered in my medical decision making (see chart for details).   Patient presents to the emergency department for acute onset of abdominal pain starting several hours ago and has worsened, 10/10 in severity at home.  Patient has a significantly tender abdomen throughout exam in all quadrants.  Patient's labs show a significant hemoglobin drop to 5.6 from a normal baseline.  Bedside rectal exam is negative.  Bedside ultrasound does not appear to show any significant fluid collection on FAST exam.  However given the patient's significant anemia, pale appearance and significantly tender abdomen I have called CT for an emergent CTA of the chest abdomen pelvis to rule out aneurysmal rupture.  I have ordered 2 units of emergent release blood.  We are rechecking a  CBC as a precaution.  Patient agreeable to plan of care.  I reviewed the patient's CTA imaging he appears to have a right ureteral stone, aorta appears normal.  No obvious intra-abdominal bleeding.  Given the patient's right ureteral stone on CT imaging negative FAST exam negative CT otherwise on my review of the images and a negative rectal exam I am repeating the CBC for possible lab error.  We are holding off on transfusion at this time given normal vitals including a normal pulse rate.  Repeat CBC is pending at this time.  Patient care signed out to Dr. Archie Balboa.  We are dosing 100 mcg of fentanyl for pain control given what appears to be a right ureteral stone on my evaluation of the images.  Seth Doyle was evaluated in Emergency Department on 08/18/2019 for the symptoms described in the history of present illness. He was evaluated in the context of the global COVID-19 pandemic, which necessitated consideration that the patient might be at risk for infection with the SARS-CoV-2 virus that causes COVID-19. Institutional protocols and algorithms that pertain to the evaluation of patients at risk for COVID-19 are in a state of rapid change based on information released by regulatory bodies including the CDC and federal and state organizations. These policies and algorithms were followed during the patient's care in the ED.  Critical care was time spent personally by me on the following activities: development of treatment plan with patient and/or surrogate as well as nursing, discussions with consultants, evaluation of patient's response to treatment, examination of patient, obtaining history from patient or surrogate, ordering and performing treatments and interventions, ordering and review of laboratory studies, ordering and review of radiographic studies, pulse oximetry and re-evaluation of patient's condition.  ____________________________________________   FINAL CLINICAL IMPRESSION(S) / ED  DIAGNOSES  Abdominal pain   Harvest Dark, MD 08/18/19 1517

## 2019-08-20 DIAGNOSIS — N200 Calculus of kidney: Secondary | ICD-10-CM

## 2019-08-20 HISTORY — DX: Calculus of kidney: N20.0

## 2019-09-08 DIAGNOSIS — D509 Iron deficiency anemia, unspecified: Secondary | ICD-10-CM

## 2019-09-11 ENCOUNTER — Other Ambulatory Visit (INDEPENDENT_AMBULATORY_CARE_PROVIDER_SITE_OTHER): Payer: BC Managed Care – PPO

## 2019-09-11 ENCOUNTER — Other Ambulatory Visit: Payer: Self-pay

## 2019-09-11 DIAGNOSIS — D509 Iron deficiency anemia, unspecified: Secondary | ICD-10-CM

## 2019-09-11 LAB — CBC WITH DIFFERENTIAL/PLATELET
Basophils Absolute: 0 10*3/uL (ref 0.0–0.1)
Basophils Relative: 0.3 % (ref 0.0–3.0)
Eosinophils Absolute: 0 10*3/uL (ref 0.0–0.7)
Eosinophils Relative: 0.8 % (ref 0.0–5.0)
HCT: 32.4 % — ABNORMAL LOW (ref 39.0–52.0)
Hemoglobin: 10.6 g/dL — ABNORMAL LOW (ref 13.0–17.0)
Lymphocytes Relative: 29.7 % (ref 12.0–46.0)
Lymphs Abs: 1.2 10*3/uL (ref 0.7–4.0)
MCHC: 32.7 g/dL (ref 30.0–36.0)
MCV: 76.1 fl — ABNORMAL LOW (ref 78.0–100.0)
Monocytes Absolute: 0.4 10*3/uL (ref 0.1–1.0)
Monocytes Relative: 9.2 % (ref 3.0–12.0)
Neutro Abs: 2.5 10*3/uL (ref 1.4–7.7)
Neutrophils Relative %: 60 % (ref 43.0–77.0)
Platelets: 212 10*3/uL (ref 150.0–400.0)
RBC: 4.26 Mil/uL (ref 4.22–5.81)
RDW: 15.5 % (ref 11.5–15.5)
WBC: 4.1 10*3/uL (ref 4.0–10.5)

## 2019-09-12 ENCOUNTER — Other Ambulatory Visit: Payer: Self-pay | Admitting: Internal Medicine

## 2019-09-12 ENCOUNTER — Encounter: Payer: Self-pay | Admitting: Internal Medicine

## 2019-09-12 DIAGNOSIS — D509 Iron deficiency anemia, unspecified: Secondary | ICD-10-CM | POA: Insufficient documentation

## 2019-09-12 DIAGNOSIS — D500 Iron deficiency anemia secondary to blood loss (chronic): Secondary | ICD-10-CM | POA: Insufficient documentation

## 2019-09-12 LAB — IRON,TIBC AND FERRITIN PANEL
%SAT: 20 % (calc) (ref 20–48)
Ferritin: 11 ng/mL — ABNORMAL LOW (ref 24–380)
Iron: 77 ug/dL (ref 50–180)
TIBC: 379 mcg/dL (calc) (ref 250–425)

## 2019-09-16 ENCOUNTER — Other Ambulatory Visit (INDEPENDENT_AMBULATORY_CARE_PROVIDER_SITE_OTHER): Payer: BC Managed Care – PPO

## 2019-09-16 DIAGNOSIS — D509 Iron deficiency anemia, unspecified: Secondary | ICD-10-CM

## 2019-09-16 LAB — FECAL OCCULT BLOOD, IMMUNOCHEMICAL: Fecal Occult Bld: NEGATIVE

## 2019-09-25 ENCOUNTER — Ambulatory Visit (INDEPENDENT_AMBULATORY_CARE_PROVIDER_SITE_OTHER): Payer: BC Managed Care – PPO | Admitting: Internal Medicine

## 2019-09-25 ENCOUNTER — Other Ambulatory Visit: Payer: Self-pay

## 2019-09-25 ENCOUNTER — Encounter: Payer: Self-pay | Admitting: Internal Medicine

## 2019-09-25 VITALS — BP 132/88 | HR 67 | Temp 97.3°F | Resp 15 | Ht 71.0 in | Wt 197.2 lb

## 2019-09-25 DIAGNOSIS — D649 Anemia, unspecified: Secondary | ICD-10-CM

## 2019-09-25 DIAGNOSIS — Z980 Intestinal bypass and anastomosis status: Secondary | ICD-10-CM

## 2019-09-25 DIAGNOSIS — R5381 Other malaise: Secondary | ICD-10-CM

## 2019-09-25 DIAGNOSIS — Z87442 Personal history of urinary calculi: Secondary | ICD-10-CM

## 2019-09-25 DIAGNOSIS — Z86010 Personal history of colonic polyps: Secondary | ICD-10-CM

## 2019-09-25 DIAGNOSIS — F5104 Psychophysiologic insomnia: Secondary | ICD-10-CM

## 2019-09-25 DIAGNOSIS — E291 Testicular hypofunction: Secondary | ICD-10-CM

## 2019-09-25 DIAGNOSIS — D509 Iron deficiency anemia, unspecified: Secondary | ICD-10-CM

## 2019-09-25 LAB — COMPREHENSIVE METABOLIC PANEL
ALT: 19 U/L (ref 0–53)
AST: 19 U/L (ref 0–37)
Albumin: 4.6 g/dL (ref 3.5–5.2)
Alkaline Phosphatase: 64 U/L (ref 39–117)
BUN: 21 mg/dL (ref 6–23)
CO2: 29 mEq/L (ref 19–32)
Calcium: 10 mg/dL (ref 8.4–10.5)
Chloride: 105 mEq/L (ref 96–112)
Creatinine, Ser: 1.22 mg/dL (ref 0.40–1.50)
GFR: 60.16 mL/min (ref >60.00)
Glucose, Bld: 108 mg/dL — ABNORMAL HIGH (ref 70–99)
Potassium: 4.2 mEq/L (ref 3.5–5.1)
Sodium: 139 mEq/L (ref 135–145)
Total Bilirubin: 0.6 mg/dL (ref 0.2–1.2)
Total Protein: 7.4 g/dL (ref 6.0–8.3)

## 2019-09-25 LAB — CBC WITH DIFFERENTIAL/PLATELET
Basophils Absolute: 0 10*3/uL (ref 0.0–0.1)
Basophils Relative: 0.3 % (ref 0.0–3.0)
Eosinophils Absolute: 0.2 10*3/uL (ref 0.0–0.7)
Eosinophils Relative: 3.1 % (ref 0.0–5.0)
HCT: 38.1 % — ABNORMAL LOW (ref 39.0–52.0)
Hemoglobin: 12.2 g/dL — ABNORMAL LOW (ref 13.0–17.0)
Lymphocytes Relative: 30 % (ref 12.0–46.0)
Lymphs Abs: 1.5 10*3/uL (ref 0.7–4.0)
MCHC: 31.9 g/dL (ref 30.0–36.0)
MCV: 77.7 fl — ABNORMAL LOW (ref 78.0–100.0)
Monocytes Absolute: 0.5 10*3/uL (ref 0.1–1.0)
Monocytes Relative: 10 % (ref 3.0–12.0)
Neutro Abs: 2.8 10*3/uL (ref 1.4–7.7)
Neutrophils Relative %: 56.6 % (ref 43.0–77.0)
Platelets: 257 10*3/uL (ref 150.0–400.0)
RBC: 4.91 Mil/uL (ref 4.22–5.81)
RDW: 17.2 % — ABNORMAL HIGH (ref 11.5–15.5)
WBC: 5 10*3/uL (ref 4.0–10.5)

## 2019-09-25 LAB — MAGNESIUM: Magnesium: 2.1 mg/dL (ref 1.5–2.5)

## 2019-09-25 LAB — B12 AND FOLATE PANEL
Folate: 15.4 ng/mL (ref >5.9)
Vitamin B-12: 1319 pg/mL — ABNORMAL HIGH (ref 211–911)

## 2019-09-25 NOTE — Progress Notes (Signed)
Subjective:  Patient ID: Seth Doyle, male    DOB: 02/04/1956  Age: 64 y.o. MRN: [id number]  CC: The primary encounter diagnosis was Anemia, unspecified type. Diagnoses of Malaise, Iron deficiency anemia, unspecified iron deficiency anemia type, Primary hypogonadism in male, Chronic insomnia, History of renal calculi, Personal history of colonic polyps, and Intestinal bypass or anastomosis status were also pertinent to this visit.  HPI Seth Doyle presents for evaluation of anemia and profound  fatigue since January    Patient has received both doses of the available COVID 19 vaccine without complications. Last dose April 2  Patient continues to mask when outside of the home except when walking in yard or at safe distances from others .  Patient denies any change in mood or development of unhealthy behaviors resuting from the pandemic's restriction of activities and socialization.    Tested for covid twice and both times negative   Hx: patient developed progressive fatigue some time in January .  On Feb 6   Was unable to donate blood bc screening hgb was 11.6    Feb 27  13. 6  Had an episode of severe RLQ pain  in March 30 that started while working out at the [employer]. .  The pain was so  severe he had to stop exercising.  Started to improve during the drive home,  Then became so severe he could not walk.   911 called ,  Taken to ER   Treated for kidney stone which passed last week on or around April 30)   28mm obstructing stone  Has not felt well since then   No energy.  Started feeling better 2 days ago  Bowels moving 1-4 times daily,  Solid stools. Intentionally losing weight.  Taking iron for the past two weeks   Outpatient Medications Prior to Visit  Medication Sig Dispense Refill  . B Complex-C (B-COMPLEX WITH VITAMIN C) tablet Take 1 tablet by mouth daily.    . cholecalciferol (VITAMIN D3) 25 MCG (1000 UNIT) tablet Take 1,000 Units by mouth daily.    . clobetasol  (TEMOVATE) 0.05 % external solution MIX WITH A JAR OF CERAVE CREAM AND  APP EXT AA BID    . diclofenac (VOLTAREN) 75 MG EC tablet Take 1 tablet (75 mg total) by mouth 2 (two) times daily. 60 tablet 3  . ferrous sulfate 325 (65 FE) MG EC tablet Take 325 mg by mouth daily with breakfast.    . hydrOXYzine (ATARAX/VISTARIL) 25 MG tablet TK 1 T PO HS PRF ITCHING. MAY MAKE DROWSY    . Multiple Vitamin (MULTIVITAMIN) tablet Take 1 tablet by mouth daily.    . ondansetron (ZOFRAN ODT) 4 MG disintegrating tablet Take 1 tablet (4 mg total) by mouth every 8 (eight) hours as needed for nausea or vomiting. 20 tablet 0  . ondansetron (ZOFRAN) 4 MG tablet Take 1 tablet (4 mg total) by mouth every 8 (eight) hours as needed. 20 tablet 0  . oxyCODONE-acetaminophen (PERCOCET) 5-325 MG tablet Take 1 tablet by mouth every 6 (six) hours as needed for severe pain. 20 tablet 0  . zinc gluconate 50 MG tablet Take 50 mg by mouth daily.    Marland Kitchen zolpidem (AMBIEN) 10 MG tablet TAKE ONE TABLET BY MOUTH EVERY NIGHT AT BEDTIME AS NEEDED FOR SLEEP 30 tablet 4  . tamsulosin (FLOMAX) 0.4 MG CAPS capsule Take 1 capsule (0.4 mg total) by mouth daily. (Patient not taking: Reported on 09/25/2019) 14 capsule  0   No facility-administered medications prior to visit.    Review of Systems;  Patient denies headache, fevers, malaise, unintentional weight loss, skin rash, eye pain, sinus congestion and sinus pain, sore throat, dysphagia,  hemoptysis , cough, dyspnea, wheezing, chest pain, palpitations, orthopnea, edema, abdominal pain, nausea, melena, diarrhea, constipation, flank pain, dysuria, hematuria, urinary  Frequency, nocturia, numbness, tingling, seizures,  Focal weakness, Loss of consciousness,  Tremor, insomnia, depression, anxiety, and suicidal ideation.      Objective:  BP 132/88 (BP Location: Left Arm, Patient Position: Sitting, Cuff Size: Normal)   Pulse 67   Temp (!) 97.3 F (36.3 C) (Temporal)   Resp 15   Ht 5\' 11"  (1.803 m)    Wt 197 lb 3.2 oz (89.4 kg)   SpO2 98%   BMI 27.50 kg/m   BP Readings from Last 3 Encounters:  09/25/19 132/88  08/18/19 131/82  08/07/19 109/68    Wt Readings from Last 3 Encounters:  09/25/19 197 lb 3.2 oz (89.4 kg)  08/18/19 200 lb (90.7 kg)  08/07/19 208 lb (94.3 kg)    General appearance: alert, cooperative and appears stated age Ears: normal TM's and external ear canals both ears Throat: lips, mucosa, and tongue normal; teeth and gums normal Neck: no adenopathy, no carotid bruit, supple, symmetrical, trachea midline and thyroid not enlarged, symmetric, no tenderness/mass/nodules Back: symmetric, no curvature. ROM normal. No CVA tenderness. Lungs: clear to auscultation bilaterally Heart: regular rate and rhythm, S1, S2 normal, no murmur, click, rub or gallop Abdomen: soft, non-tender; bowel sounds normal; no masses,  no organomegaly Pulses: 2+ and symmetric Skin: Skin color, texture, turgor normal. No rashes or lesions Lymph nodes: Cervical, supraclavicular, and axillary nodes normal.  Lab Results  Component Value Date   HGBA1C 5.7 09/20/2017   HGBA1C 5.7 01/06/2016    Lab Results  Component Value Date   CREATININE 1.22 09/25/2019   CREATININE 1.30 (H) 06/12/2019   CREATININE 1.14 09/20/2017    Lab Results  Component Value Date   WBC 5.0 09/25/2019   HGB 12.2 (L) 09/25/2019   HCT 38.1 (L) 09/25/2019   PLT 257.0 09/25/2019   GLUCOSE 108 (H) 09/25/2019   CHOL CANCELED 01/17/2018   TRIG CANCELED 01/17/2018   HDL CANCELED 01/17/2018   LDLDIRECT 69.0 02/28/2016   LDLCALC 90 12/07/2016   ALT 19 09/25/2019   AST 19 09/25/2019   NA 139 09/25/2019   K 4.2 09/25/2019   CL 105 09/25/2019   CREATININE 1.22 09/25/2019   BUN 21 09/25/2019   CO2 29 09/25/2019   TSH 1.02 09/20/2017   PSA 0.7 05/31/2017   HGBA1C 5.7 09/20/2017   MICROALBUR 0.7 02/28/2016    CT Angio Chest/Abd/Pel for Dissection W and/or Wo Contrast  Result Date: 08/18/2019 CLINICAL DATA:   Right lower quadrant abdominal pain EXAM: CT ANGIOGRAPHY CHEST, ABDOMEN AND PELVIS TECHNIQUE: Multidetector CT imaging through the chest, abdomen and pelvis was performed using the standard protocol during bolus administration of intravenous contrast. Multiplanar reconstructed images and MIPs were obtained and reviewed to evaluate the vascular anatomy. CONTRAST:  168mL OMNIPAQUE IOHEXOL 350 MG/ML SOLN COMPARISON:  06/12/2019 FINDINGS: CTA CHEST FINDINGS Cardiovascular: This is a technically adequate evaluation of the thoracic aorta. No aneurysm or dissection. The heart is unremarkable without pericardial effusion. While not optimized for opacification of the pulmonary vasculature, there is sufficient contrast enhancement to exclude pulmonary emboli. Mediastinum/Nodes: No enlarged mediastinal, hilar, or axillary lymph nodes. Thyroid gland, trachea, and esophagus demonstrate no significant findings. Lungs/Pleura:  No airspace disease, effusion, or pneumothorax. Central airways are widely patent. Musculoskeletal: No acute displaced fracture. Reconstructed images demonstrate no additional findings. Review of the MIP images confirms the above findings. CTA ABDOMEN AND PELVIS FINDINGS VASCULAR Aorta: Normal caliber aorta without aneurysm, dissection, vasculitis or significant stenosis. Celiac: Patent without evidence of aneurysm, dissection, vasculitis or significant stenosis. SMA: Patent without evidence of aneurysm, dissection, vasculitis or significant stenosis. Renals: There are 2 renal arteries bilaterally, which are widely patent without significant atherosclerosis or stenosis. IMA: Patent without evidence of aneurysm, dissection, vasculitis or significant stenosis. Inflow: Patent without evidence of aneurysm, dissection, vasculitis or significant stenosis. Veins: No obvious venous abnormality within the limitations of this arterial phase study. Review of the MIP images confirms the above findings. NON-VASCULAR  Hepatobiliary: No focal liver abnormality is seen. No gallstones, gallbladder wall thickening, or biliary dilatation. Pancreas: Unremarkable. No pancreatic ductal dilatation or surrounding inflammatory changes. Spleen: Normal in size without focal abnormality. Heterogeneous enhancement related to arterial phase of exam. Adrenals/Urinary Tract: There is a punctate 2 mm obstructing mid right ureteral calculus reference image 134, with right-sided obstructive uropathy as result. Left kidney is unremarkable. Stable right adrenal adenoma. Left adrenal is normal. Bladder is unremarkable. Stomach/Bowel: Scattered colonic diverticulosis without diverticulitis. Normal appendix. No bowel obstruction or ileus. Postsurgical changes from prior partial sigmoid colon resection and reanastomosis. Lymphatic: No pathologic adenopathy within the abdomen or pelvis. Reproductive: Prostate is unremarkable. Other: No abdominal wall hernia or abnormality. No abdominopelvic ascites. Musculoskeletal: No acute or destructive bony lesions. Reconstructed images demonstrate no additional findings. Review of the MIP images confirms the above findings. IMPRESSION: 1. No evidence of aneurysm or dissection within the thoracoabdominal aorta. 2. Punctate less than 2 mm obstructing calculus mid right ureter, with mild right-sided obstructive uropathy. 3. Diverticulosis without diverticulitis. 4. Otherwise no acute intrathoracic, intra-abdominal, or intrapelvic process. Electronically Signed   By: Randa Ngo M.D.   On: 08/18/2019 15:15    Assessment & Plan:   Problem List Items Addressed This Visit      Unprioritized   Anemia - Primary    Microcytic,  With hgb  drop from 14  2 years ago to 10.2 one month ago .  borderline low iron levels.  FOBT negative .  Improved with iron supplementation.  referring to hematology. SPEP and IFE pending    Lab Results  Component Value Date   WBC 5.0 09/25/2019   HGB 12.2 (L) 09/25/2019   HCT 38.1  (L) 09/25/2019   MCV 77.7 (L) 09/25/2019   PLT 257.0 09/25/2019   Lab Results  Component Value Date   VITAMINB12 1,319 (H) 09/25/2019   Lab Results  Component Value Date   IRON 40 (L) 09/25/2019   TIBC 365 09/25/2019   FERRITIN 21 (L) 09/25/2019   Lab Results  Component Value Date   FOLATE 15.4 09/25/2019         Relevant Medications   ferrous sulfate 325 (65 FE) MG EC tablet   Other Relevant Orders   Testos,Total,Free and SHBG (Male)   B12 and Folate Panel (Completed)   CBC with Differential/Platelet (Completed)   Protein electrophoresis, serum   IFE AND PE, RANDOM URINE   Iron, TIBC and Ferritin Panel (Completed)   Ambulatory referral to Hematology   Chronic insomnia    Managed with Lorrin Mais.  Refills given.  Encouraged to use sparingly in favor of non pharmcologic methods of relaxation       History of renal calculi    Treated  in ER and released with Flomax ,  Opioids.  Passed 2 mm stone on April 30.  All symptoms of pain resolved.       Intestinal bypass or anastomosis status    Evidence of prior sigmoid anastomosis  On recent colonoscopy appeared normal.      Iron deficiency anemia    Etiology unclear but improving hgb wih 2 weeks of oral iron. .  He has had screening colonoscopy in the last several months and a 2 mm polyp was removed.  Evidence of prior sigmoid anastomosis was noted and appeared health.  FOBT was negative April 28.  Continue iron supplements.  Refer to hematology  Serum protein electrophoresis and urine IFE ordered.  Lab Results  Component Value Date   WBC 5.0 09/25/2019   HGB 12.2 (L) 09/25/2019   HCT 38.1 (L) 09/25/2019   MCV 77.7 (L) 09/25/2019   PLT 257.0 09/25/2019         Relevant Medications   ferrous sulfate 325 (65 FE) MG EC tablet   Personal history of colonic polyps    Follow up colonoscopy was done March 2021,  2 mm polyp removed       Primary hypogonadism in male    He is requesting repeat assessment of testosterone  levels due to persistent fatigue. Level is pending.         Other Visit Diagnoses    Malaise       Relevant Orders   Comprehensive metabolic panel (Completed)   Magnesium (Completed)     I provided  30 minutes of  face-to-face time during this encounter reviewing patient's current problems and past surgeries, labs and imaging studies, providing counseling on the above mentioned problems , and coordination  of care .  I have discontinued Seth Doyle "Charlie"'s tamsulosin. I am also having him maintain his B-complex with vitamin C, diclofenac, clobetasol, hydrOXYzine, ondansetron, ondansetron, oxyCODONE-acetaminophen, zinc gluconate, cholecalciferol, ferrous sulfate, multivitamin, and zolpidem.  Meds ordered this encounter  Medications  . zolpidem (AMBIEN) 10 MG tablet    Sig: TAKE ONE TABLET BY MOUTH EVERY NIGHT AT BEDTIME AS NEEDED FOR SLEEP    Dispense:  30 tablet    Refill:  4    Medications Discontinued During This Encounter  Medication Reason  . tamsulosin (FLOMAX) 0.4 MG CAPS capsule   . zolpidem (AMBIEN) 10 MG tablet Reorder    Follow-up: No follow-ups on file.   Crecencio Mc, MD

## 2019-09-25 NOTE — Patient Instructions (Signed)
Hematology referral in progress (unless the anemia has magically resolved)

## 2019-09-28 DIAGNOSIS — Z87442 Personal history of urinary calculi: Secondary | ICD-10-CM | POA: Insufficient documentation

## 2019-09-28 DIAGNOSIS — D649 Anemia, unspecified: Secondary | ICD-10-CM | POA: Insufficient documentation

## 2019-09-28 MED ORDER — ZOLPIDEM TARTRATE 10 MG PO TABS: ORAL_TABLET | ORAL | 4 refills | Status: DC

## 2019-09-28 NOTE — Telephone Encounter (Signed)
Pt called wanted to make sure we received refill request for Ambien. Needs it sent to [facility].

## 2019-09-28 NOTE — Assessment & Plan Note (Addendum)
He is requesting repeat assessment of testosterone levels due to persistent fatigue. Level is pending.

## 2019-09-28 NOTE — Assessment & Plan Note (Signed)
Follow up colonoscopy was done March 2021,  2 mm polyp removed

## 2019-09-28 NOTE — Assessment & Plan Note (Signed)
Evidence of prior sigmoid anastomosis  On recent colonoscopy appeared normal.

## 2019-09-28 NOTE — Telephone Encounter (Signed)
Refill request for Seth Doyle, last seen 09-25-19, last filled 02-10-19.  Please advise.

## 2019-09-28 NOTE — Assessment & Plan Note (Signed)
Managed with ambien.  Refills given.  Encouraged to use sparingly in favor of non pharmcologic methods of relaxation

## 2019-09-28 NOTE — Assessment & Plan Note (Signed)
Etiology unclear but improving hgb wih 2 weeks of oral iron. .  He has had screening colonoscopy in the last several months and a 2 mm polyp was removed.  Evidence of prior sigmoid anastomosis was noted and appeared health.  FOBT was negative April 28.  Continue iron supplements.  Refer to hematology  Serum protein electrophoresis and urine IFE ordered.  Lab Results  Component Value Date   WBC 5.0 09/25/2019   HGB 12.2 (L) 09/25/2019   HCT 38.1 (L) 09/25/2019   MCV 77.7 (L) 09/25/2019   PLT 257.0 09/25/2019

## 2019-09-28 NOTE — Assessment & Plan Note (Signed)
Treated in ER and released with Flomax ,  Opioids.  Passed 2 mm stone on April 30.  All symptoms of pain resolved.

## 2019-09-28 NOTE — Assessment & Plan Note (Addendum)
Microcytic,  With hgb  drop from 14  2 years ago to 10.2 one month ago .  borderline low iron levels.  FOBT negative .  Improved with iron supplementation.  referring to hematology. SPEP and IFE pending    Lab Results  Component Value Date   WBC 5.0 09/25/2019   HGB 12.2 (L) 09/25/2019   HCT 38.1 (L) 09/25/2019   MCV 77.7 (L) 09/25/2019   PLT 257.0 09/25/2019   Lab Results  Component Value Date   VITAMINB12 1,319 (H) 09/25/2019   Lab Results  Component Value Date   IRON 40 (L) 09/25/2019   TIBC 365 09/25/2019   FERRITIN 21 (L) 09/25/2019   Lab Results  Component Value Date   FOLATE 15.4 09/25/2019

## 2019-09-29 LAB — IRON,TIBC AND FERRITIN PANEL
%SAT: 11 % (calc) — ABNORMAL LOW (ref 20–48)
Ferritin: 21 ng/mL — ABNORMAL LOW (ref 24–380)
Iron: 40 ug/dL — ABNORMAL LOW (ref 50–180)
TIBC: 365 mcg/dL (calc) (ref 250–425)

## 2019-09-29 LAB — PROTEIN ELECTROPHORESIS, SERUM
Albumin ELP: 4.5 g/dL (ref 3.8–4.8)
Alpha 1: 0.3 g/dL (ref 0.2–0.3)
Alpha 2: 0.7 g/dL (ref 0.5–0.9)
Beta 2: 0.3 g/dL (ref 0.2–0.5)
Beta Globulin: 0.5 g/dL (ref 0.4–0.6)
Gamma Globulin: 1.2 g/dL (ref 0.8–1.7)
Total Protein: 7.6 g/dL (ref 6.1–8.1)

## 2019-09-29 LAB — IFE AND PE, RANDOM URINE
% BETA, Urine: 41.5 %
ALBUMIN, U: 21.8 %
ALPHA 1 URINE: 3.6 %
ALPHA-2-GLOBULIN, U: 14.6 %
GAMMA GLOBULIN URINE: 18.4 %
Protein, Ur: 7.5 mg/dL

## 2019-09-29 LAB — TESTOS,TOTAL,FREE AND SHBG (FEMALE)
Free Testosterone: 77.8 pg/mL (ref 35.0–155.0)
Sex Hormone Binding: 27 nmol/L (ref 22–77)
Testosterone, Total, LC-MS-MS: 430 ng/dL (ref 250–1100)

## 2019-10-05 ENCOUNTER — Inpatient Hospital Stay: Payer: BC Managed Care – PPO

## 2019-10-05 ENCOUNTER — Other Ambulatory Visit: Payer: Self-pay

## 2019-10-05 ENCOUNTER — Inpatient Hospital Stay: Payer: BC Managed Care – PPO | Attending: Oncology | Admitting: Oncology

## 2019-10-05 ENCOUNTER — Encounter: Payer: Self-pay | Admitting: Oncology

## 2019-10-05 VITALS — BP 136/80 | HR 67 | Temp 95.2°F | Resp 18 | Wt 195.7 lb

## 2019-10-05 DIAGNOSIS — D649 Anemia, unspecified: Secondary | ICD-10-CM

## 2019-10-05 DIAGNOSIS — N201 Calculus of ureter: Secondary | ICD-10-CM | POA: Insufficient documentation

## 2019-10-05 DIAGNOSIS — E559 Vitamin D deficiency, unspecified: Secondary | ICD-10-CM | POA: Diagnosis not present

## 2019-10-05 DIAGNOSIS — D509 Iron deficiency anemia, unspecified: Secondary | ICD-10-CM | POA: Insufficient documentation

## 2019-10-05 DIAGNOSIS — Z8719 Personal history of other diseases of the digestive system: Secondary | ICD-10-CM | POA: Diagnosis not present

## 2019-10-05 DIAGNOSIS — R634 Abnormal weight loss: Secondary | ICD-10-CM | POA: Diagnosis not present

## 2019-10-05 DIAGNOSIS — E291 Testicular hypofunction: Secondary | ICD-10-CM | POA: Insufficient documentation

## 2019-10-05 DIAGNOSIS — Z79899 Other long term (current) drug therapy: Secondary | ICD-10-CM | POA: Diagnosis not present

## 2019-10-05 DIAGNOSIS — N400 Enlarged prostate without lower urinary tract symptoms: Secondary | ICD-10-CM | POA: Insufficient documentation

## 2019-10-05 DIAGNOSIS — G4700 Insomnia, unspecified: Secondary | ICD-10-CM | POA: Insufficient documentation

## 2019-10-05 DIAGNOSIS — K760 Fatty (change of) liver, not elsewhere classified: Secondary | ICD-10-CM | POA: Diagnosis not present

## 2019-10-05 LAB — CBC
HCT: 37.2 % — ABNORMAL LOW (ref 39.0–52.0)
Hemoglobin: 11.8 g/dL — ABNORMAL LOW (ref 13.0–17.0)
MCH: 25.1 pg — ABNORMAL LOW (ref 26.0–34.0)
MCHC: 31.7 g/dL (ref 30.0–36.0)
MCV: 79 fL — ABNORMAL LOW (ref 80.0–100.0)
Platelets: 216 10*3/uL (ref 150–400)
RBC: 4.71 MIL/uL (ref 4.22–5.81)
RDW: 17.7 % — ABNORMAL HIGH (ref 11.5–15.5)
WBC: 6.2 10*3/uL (ref 4.0–10.5)
nRBC: 0 % (ref 0.0–0.2)

## 2019-10-05 LAB — SEDIMENTATION RATE: Sed Rate: 9 mm/hr (ref 0–20)

## 2019-10-05 LAB — RETICULOCYTES
Immature Retic Fract: 15.8 % (ref 2.3–15.9)
RBC.: 4.71 MIL/uL (ref 4.22–5.81)
Retic Count, Absolute: 35.3 10*3/uL (ref 19.0–186.0)
Retic Ct Pct: 0.8 % (ref 0.4–3.1)

## 2019-10-05 LAB — TSH: TSH: 1.239 u[IU]/mL (ref 0.350–4.500)

## 2019-10-05 LAB — LACTATE DEHYDROGENASE: LDH: 153 U/L (ref 98–192)

## 2019-10-05 NOTE — Progress Notes (Signed)
Patient here for initial hematology  appointment,  complaints of extreme fatigue and intense itching, otherwise no other concerns at this time.

## 2019-10-20 ENCOUNTER — Encounter: Payer: Self-pay | Admitting: Oncology

## 2019-10-20 NOTE — Progress Notes (Signed)
Hematology/Oncology Consult note [facility] Telephone:([phone] [phone]  Patient Care Team: Crecencio Mc, MD as PCP - General (Internal Medicine)   Name of the patient: Seth Doyle  [id number]  [birth year]    Reason for referral- anemia   Referring physician- dr. Derrel Nip  Date of visit: 10/20/19   History of presenting illness- Patient is a 64 year old male with a past medical history significant for BPH, hypogonadism referred for anemia.  Patient's most recent CBC from 09/25/2019 showed white cell count of 5, H&H of 12.2/38.1 with an MCV of 77 and a platelet count of 257.  Iron study showed a low ferritin of 21 and iron saturation of 11%.  B12 levels were elevated at 1319 and folate was normal.  SPEP did not show any M protein.  Patient currently reports significant fatigue that has been ongoing since January. He has lost 17 pounds in the last 3 months.  CT abdomen pelvis with contrast in January 2021 did not show any evidence of malignancy.  He has a prior history of sigmoidal resection for suspected diverticulitis.  He also underwent CT angio chest abdomen and pelvis in March 2021 which did not show any evidence of malignancy.  No evidence of aneurysm or dissection.  2 mm obstructing calculus in the right ureter.  Patient recently started taking oral iron  ECOG PS- 1  Pain scale- 0   Review of systems- Review of Systems  Constitutional: Positive for malaise/fatigue and weight loss. Negative for chills and fever.  HENT: Negative for congestion, ear discharge and nosebleeds.   Eyes: Negative for blurred vision.  Respiratory: Negative for cough, hemoptysis, sputum production, shortness of breath and wheezing.   Cardiovascular: Negative for chest pain, palpitations, orthopnea and claudication.  Gastrointestinal: Negative for abdominal pain, blood in stool, constipation, diarrhea, heartburn, melena, nausea and vomiting.  Genitourinary: Negative  for dysuria, flank pain, frequency, hematuria and urgency.  Musculoskeletal: Negative for back pain, joint pain and myalgias.  Skin: Negative for rash.  Neurological: Negative for dizziness, tingling, focal weakness, seizures, weakness and headaches.  Endo/Heme/Allergies: Does not bruise/bleed easily.  Psychiatric/Behavioral: Negative for depression and suicidal ideas. The patient does not have insomnia.     Allergies  Allergen Reactions  . Wellbutrin [Bupropion] Itching    Patient Active Problem List   Diagnosis Date Noted  . Anemia 09/28/2019  . History of renal calculi 09/28/2019  . Iron deficiency anemia 09/12/2019  . Benign neoplasm of cecum   . Clicking of left hip 12/87/8676  . Otitis media 05/26/2018  . Intermittent epigastric abdominal pain 09/22/2017  . Small intestinal bacterial overgrowth 12/09/2016  . Primary hypogonadism in male 07/03/2015  . Personal history of colonic polyps   . Intestinal bypass or anastomosis status   . Benign neoplasm of transverse colon   . Hepatic steatosis 02/22/2015  . 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 Vitamin D deficiency 05/02/2014  . Urinary retention due to benign prostatic hyperplasia 04/27/2014  . History of hypertension 04/27/2014  . Encounter for preventive health examination 04/27/2014  . Chronic insomnia 03/26/2014  . 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 Reflux esophagitis 09/10/2012  . Diverticulosis of colon without hemorrhage 02/04/2011     Past Medical History:  Diagnosis Date  . Chronic insomnia 03/26/2014   Managed with generic ambien.     . 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 Constipation 02/22/2015  . Diverticulitis   . Diverticulitis of sigmoid colon 02/04/2011  . Essential hypertension 04/27/2014  related to event. no longer on medicines.  . Hepatic steatosis 02/22/2015  . Kidney stone 08/20/2019  . Left rotator cuff tear 10/30/2013  . Reflux esophagitis 09/10/2012  .  Subacromial bursitis 09/18/2013  . Urinary retention due to benign prostatic hyperplasia 04/27/2014  . Vertigo    3 episodes.  last one approx 6 mos ago  . Wears contact lenses      Past Surgical History:  Procedure Laterality Date  . COLON SURGERY  August 2012   sigmoid colectomy, diverticulitis  . COLONOSCOPY WITH PROPOFOL N/A 05/06/2015   Procedure: COLONOSCOPY WITH PROPOFOL;  Surgeon: Lucilla Lame, MD;  Location: [place];  Service: Endoscopy;  Laterality: N/A;  . COLONOSCOPY WITH PROPOFOL N/A 08/07/2019   Procedure: COLONOSCOPY WITH PROPOFOL;  Surgeon: Lucilla Lame, MD;  Location: [facility] ENDOSCOPY;  Service: Endoscopy;  Laterality: N/A;  . EYE SURGERY     tear duct repair   . POLYPECTOMY  05/06/2015   Procedure: POLYPECTOMY;  Surgeon: Lucilla Lame, MD;  Location: [place];  Service: Endoscopy;;  . RHINOPLASTY  1992   deviated septum due to volleyball  . RHINOPLASTY  1992   foe deviated septm, traumatic  [facility]   . ROTATOR CUFF REPAIR  1991   Dr. Marry Guan  . SHOULDER ARTHROSCOPY W/ ROTATOR CUFF REPAIR  1991   [facility]    Social History   Socioeconomic History  . Marital status: Married    Spouse name: Not on file  . Number of children: Not on file  . Years of education: Not on file  . Highest education level: Not on file  Occupational History  . Not on file  Tobacco Use  . Smoking status: Never Smoker  . Smokeless tobacco: Never Used  Substance and Sexual Activity  . Alcohol use: Yes    Comment: rare - once /year  . Drug use: No  . Sexual activity: Yes  Other Topics Concern  . Not on file  Social History Narrative  . Not on file   Social Determinants of Health   Financial Resource Strain:   . Difficulty of Paying Living Expenses:   Food Insecurity:   . Worried About Charity fundraiser in the Last Year:   . Arboriculturist in the Last Year:   Transportation Needs:   . Film/video editor (Medical):   Marland Kitchen Lack of Transportation  (Non-Medical):   Physical Activity:   . Days of Exercise per Week:   . Minutes of Exercise per Session:   Stress:   . Feeling of Stress :   Social Connections:   . Frequency of Communication with Friends and Family:   . Frequency of Social Gatherings with Friends and Family:   . Attends Religious Services:   . Active Member of Clubs or Organizations:   . Attends Archivist Meetings:   Marland Kitchen Marital Status:   Intimate Partner Violence:   . Fear of Current or Ex-Partner:   . Emotionally Abused:   Marland Kitchen Physically Abused:   . Sexually Abused:      Family History  Problem Relation Age of Onset  . Diabetes Mother   . Mental illness Mother        Dementia  . COPD Father   . Hyperlipidemia Father   . Mental illness Father        Parkinson's Disease     Current Outpatient Medications:  .  B Complex-C (B-COMPLEX WITH VITAMIN C) tablet, Take 1 tablet by mouth daily.,  Disp: , Rfl:  .  cholecalciferol (VITAMIN D3) 25 MCG (1000 UNIT) tablet, Take 1,000 Units by mouth daily., Disp: , Rfl:  .  clobetasol (TEMOVATE) 0.05 % external solution, MIX WITH A JAR OF CERAVE CREAM AND  APP EXT AA BID, Disp: , Rfl:  .  ferrous sulfate 325 (65 FE) MG EC tablet, Take 325 mg by mouth daily with breakfast., Disp: , Rfl:  .  hydrOXYzine (ATARAX/VISTARIL) 25 MG tablet, TK 1 T PO HS PRF ITCHING. MAY MAKE DROWSY, Disp: , Rfl:  .  ibuprofen (ADVIL) 400 MG tablet, Take 400 mg by mouth every 6 (six) hours as needed., Disp: , Rfl:  .  Multiple Vitamin (MULTIVITAMIN) tablet, Take 1 tablet by mouth daily., Disp: , Rfl:  .  ondansetron (ZOFRAN ODT) 4 MG disintegrating tablet, Take 1 tablet (4 mg total) by mouth every 8 (eight) hours as needed for nausea or vomiting., Disp: 20 tablet, Rfl: 0 .  ondansetron (ZOFRAN) 4 MG tablet, Take 1 tablet (4 mg total) by mouth every 8 (eight) hours as needed., Disp: 20 tablet, Rfl: 0 .  zinc gluconate 50 MG tablet, Take 50 mg by mouth daily., Disp: , Rfl:  .  zolpidem (AMBIEN)  10 MG tablet, TAKE ONE TABLET BY MOUTH EVERY NIGHT AT BEDTIME AS NEEDED FOR SLEEP, Disp: 30 tablet, Rfl: 4 .  diclofenac (VOLTAREN) 75 MG EC tablet, Take 1 tablet (75 mg total) by mouth 2 (two) times daily., Disp: 60 tablet, Rfl: 3 .  oxyCODONE-acetaminophen (PERCOCET) 5-325 MG tablet, Take 1 tablet by mouth every 6 (six) hours as needed for severe pain. (Patient not taking: Reported on 10/05/2019), Disp: 20 tablet, Rfl: 0   Physical exam:  Vitals:   10/05/19 1338  BP: 136/80  Pulse: 67  Resp: 18  Temp: (!) 95.2 F (35.1 C)  TempSrc: Tympanic  SpO2: 99%  Weight: 195 lb 11.2 oz (88.8 kg)   Physical Exam Constitutional:      General: He is not in acute distress. Cardiovascular:     Rate and Rhythm: Normal rate and regular rhythm.     Heart sounds: Normal heart sounds.  Pulmonary:     Effort: Pulmonary effort is normal.     Breath sounds: Normal breath sounds.  Abdominal:     General: Bowel sounds are normal.     Palpations: Abdomen is soft.  Lymphadenopathy:     Comments: No palpable cervical, supraclavicular, axillary or inguinal adenopathy   Skin:    General: Skin is warm and dry.  Neurological:     Mental Status: He is alert and oriented to person, place, and time.        CMP Latest Ref Rng & Units 09/25/2019  Glucose 70 - 99 mg/dL -  BUN 6 - 23 mg/dL -  Creatinine 0.40 - 1.50 mg/dL -  Sodium 135 - 145 mEq/L -  Potassium 3.5 - 5.1 mEq/L -  Chloride 96 - 112 mEq/L -  CO2 19 - 32 mEq/L -  Calcium 8.4 - 10.5 mg/dL -  Total Protein 6.1 - 8.1 g/dL 7.6  Total Bilirubin 0.2 - 1.2 mg/dL -  Alkaline Phos 39 - 117 U/L -  AST 0 - 37 U/L -  ALT 0 - 53 U/L -   CBC Latest Ref Rng & Units 10/05/2019  WBC 4.0 - 10.5 K/uL 6.2  Hemoglobin 13.0 - 17.0 g/dL 11.8(L)  Hematocrit 39.0 - 52.0 % 37.2(L)  Platelets 150 - 400 K/uL 216  Assessment and plan- Patient is a 64 y.o. male referred for microcytic anemia  On review of his recent labs from January 2021 patient was found  to have mild anemia with a hemoglobin of 12.2.  Iron studies were consistent with iron deficiency.  B12 and folate were normal.  I suspect his anemia should improve with repletion of iron and we discussed both oral and IV iron.  Patient would like to continue oral iron a little longer and I will repeat his CBC ferritin and iron studies in 2 months.  If iron studies remain low at that time we will give him a trial of IV iron.  I will also check LDH ESR and TSH today to complete his anemia work-up  Abnormal weight loss: From an oncology standpoint he was not found to have any malignancy based on recent CT scans in January and March 2021.  Also no significant cytopenias other than mild anemia.  No palpable adenopathy or splenomegaly noted on exam as well as imaging.  Suspicion for a primary bone marrow process is low and he therefore does not require a bone marrow biopsy.   Thank you for this kind referral and the opportunity to participate in the care of this  Patient   Visit Diagnosis 1. Anemia, unspecified type     Dr. Randa Evens, MD, MPH [facility] [phone] 10/20/2019  8:12 AM

## 2019-10-23 ENCOUNTER — Encounter: Payer: BC Managed Care – PPO | Admitting: Internal Medicine

## 2019-12-04 ENCOUNTER — Inpatient Hospital Stay: Payer: BC Managed Care – PPO | Admitting: Oncology

## 2019-12-04 ENCOUNTER — Other Ambulatory Visit: Payer: Self-pay

## 2019-12-04 ENCOUNTER — Inpatient Hospital Stay: Payer: BC Managed Care – PPO | Attending: Oncology

## 2019-12-04 ENCOUNTER — Encounter: Payer: Self-pay | Admitting: Oncology

## 2019-12-04 VITALS — BP 123/94 | HR 71 | Temp 97.6°F | Resp 16 | Wt 193.8 lb

## 2019-12-04 DIAGNOSIS — N400 Enlarged prostate without lower urinary tract symptoms: Secondary | ICD-10-CM | POA: Diagnosis not present

## 2019-12-04 DIAGNOSIS — D509 Iron deficiency anemia, unspecified: Secondary | ICD-10-CM | POA: Insufficient documentation

## 2019-12-04 DIAGNOSIS — Z79899 Other long term (current) drug therapy: Secondary | ICD-10-CM | POA: Insufficient documentation

## 2019-12-04 DIAGNOSIS — K760 Fatty (change of) liver, not elsewhere classified: Secondary | ICD-10-CM | POA: Insufficient documentation

## 2019-12-04 DIAGNOSIS — R5383 Other fatigue: Secondary | ICD-10-CM | POA: Insufficient documentation

## 2019-12-04 DIAGNOSIS — Z87442 Personal history of urinary calculi: Secondary | ICD-10-CM | POA: Diagnosis not present

## 2019-12-04 DIAGNOSIS — I10 Essential (primary) hypertension: Secondary | ICD-10-CM | POA: Diagnosis not present

## 2019-12-04 DIAGNOSIS — D649 Anemia, unspecified: Secondary | ICD-10-CM

## 2019-12-04 DIAGNOSIS — E785 Hyperlipidemia, unspecified: Secondary | ICD-10-CM | POA: Diagnosis not present

## 2019-12-04 DIAGNOSIS — E119 Type 2 diabetes mellitus without complications: Secondary | ICD-10-CM | POA: Diagnosis not present

## 2019-12-04 DIAGNOSIS — K5900 Constipation, unspecified: Secondary | ICD-10-CM | POA: Insufficient documentation

## 2019-12-04 LAB — CBC WITH DIFFERENTIAL/PLATELET
Abs Immature Granulocytes: 0.02 10*3/uL (ref 0.00–0.07)
Basophils Absolute: 0 10*3/uL (ref 0.0–0.1)
Basophils Relative: 0 %
Eosinophils Absolute: 0.1 10*3/uL (ref 0.0–0.5)
Eosinophils Relative: 2 %
HCT: 38.4 % — ABNORMAL LOW (ref 39.0–52.0)
Hemoglobin: 12.9 g/dL — ABNORMAL LOW (ref 13.0–17.0)
Immature Granulocytes: 1 %
Lymphocytes Relative: 35 %
Lymphs Abs: 1.6 10*3/uL (ref 0.7–4.0)
MCH: 26.4 pg (ref 26.0–34.0)
MCHC: 33.6 g/dL (ref 30.0–36.0)
MCV: 78.5 fL — ABNORMAL LOW (ref 80.0–100.0)
Monocytes Absolute: 0.6 10*3/uL (ref 0.1–1.0)
Monocytes Relative: 14 %
Neutro Abs: 2.2 10*3/uL (ref 1.7–7.7)
Neutrophils Relative %: 48 %
Platelets: 178 10*3/uL (ref 150–400)
RBC: 4.89 MIL/uL (ref 4.22–5.81)
RDW: 17.2 % — ABNORMAL HIGH (ref 11.5–15.5)
WBC: 4.4 10*3/uL (ref 4.0–10.5)
nRBC: 0 % (ref 0.0–0.2)

## 2019-12-04 LAB — IRON AND TIBC
Iron: 47 ug/dL (ref 45–182)
Saturation Ratios: 14 % — ABNORMAL LOW (ref 17.9–39.5)
TIBC: 346 ug/dL (ref 250–450)
UIBC: 299 ug/dL

## 2019-12-04 LAB — FERRITIN: Ferritin: 19 ng/mL — ABNORMAL LOW (ref 24–336)

## 2019-12-04 NOTE — Progress Notes (Signed)
Hematology/Oncology Consult note [facility]  Telephone:([phone] [phone]  Patient Care Team: Crecencio Mc, MD as PCP - General (Internal Medicine)   Name of the patient: Seth Doyle  [id number]  [birth year]   Date of visit: 12/04/19  Diagnosis-iron deficiency anemia  Chief complaint/ Reason for visit-routine follow-up of iron deficiency anemia  Heme/Onc history: Patient is a 64 year old male with a past medical history significant for BPH, hypogonadism referred for anemia.  Patient's most recent CBC from 09/25/2019 showed white cell count of 5, H&H of 12.2/38.1 with an MCV of 77 and a platelet count of 257.  Iron study showed a low ferritin of 21 and iron saturation of 11%.  B12 levels were elevated at 1319 and folate was normal.  SPEP did not show any M protein.  Patient currently reports significant fatigue that has been ongoing since January. He has lost 17 pounds in the last 3 months.  CT abdomen pelvis with contrast in January 2021 did not show any evidence of malignancy.  He has a prior history of sigmoidal resection for suspected diverticulitis.  He also underwent CT angio chest abdomen and pelvis in March 2021 which did not show any evidence of malignancy.  No evidence of aneurysm or dissection.  2 mm obstructing calculus in the right ureter.  Patient is taking oral iron  Interval history-patient has been on oral iron once a day for the last 2 months.  Reports he does not feel as cold anymore but still has some ongoing fatigue. He has lost 4 pounds in the last 2 months but states that he wants to actively lose weight and his target weight is about 185 pounds  ECOG PS- 1 Pain scale- 0   Review of systems- Review of Systems  Constitutional: Positive for malaise/fatigue and weight loss. Negative for chills and fever.  HENT: Negative for congestion, ear discharge and nosebleeds.   Eyes: Negative for blurred vision.  Respiratory: Negative  for cough, hemoptysis, sputum production, shortness of breath and wheezing.   Cardiovascular: Negative for chest pain, palpitations, orthopnea and claudication.  Gastrointestinal: Negative for abdominal pain, blood in stool, constipation, diarrhea, heartburn, melena, nausea and vomiting.  Genitourinary: Negative for dysuria, flank pain, frequency, hematuria and urgency.  Musculoskeletal: Negative for back pain, joint pain and myalgias.  Skin: Negative for rash.  Neurological: Negative for dizziness, tingling, focal weakness, seizures, weakness and headaches.  Endo/Heme/Allergies: Does not bruise/bleed easily.  Psychiatric/Behavioral: Negative for depression and suicidal ideas. The patient does not have insomnia.       Allergies  Allergen Reactions  . Wellbutrin [Bupropion] Itching     Past Medical History:  Diagnosis Date  . Chronic insomnia 03/26/2014   Managed with generic ambien.     . 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 Constipation 02/22/2015  . Diverticulitis   . Diverticulitis of sigmoid colon 02/04/2011  . Essential hypertension 04/27/2014   related to event. no longer on medicines.  . Hepatic steatosis 02/22/2015  . Kidney stone 08/20/2019  . Left rotator cuff tear 10/30/2013  . Reflux esophagitis 09/10/2012  . Subacromial bursitis 09/18/2013  . Urinary retention due to benign prostatic hyperplasia 04/27/2014  . Vertigo    3 episodes.  last one approx 6 mos ago  . Wears contact lenses      Past Surgical History:  Procedure Laterality Date  . COLON SURGERY  August 2012   sigmoid colectomy, diverticulitis  . COLONOSCOPY WITH PROPOFOL N/A 05/06/2015  Procedure: COLONOSCOPY WITH PROPOFOL;  Surgeon: Lucilla Lame, MD;  Location: [place];  Service: Endoscopy;  Laterality: N/A;  . COLONOSCOPY WITH PROPOFOL N/A 08/07/2019   Procedure: COLONOSCOPY WITH PROPOFOL;  Surgeon: Lucilla Lame, MD;  Location: [facility] ENDOSCOPY;  Service: Endoscopy;  Laterality: N/A;  .  EYE SURGERY     tear duct repair   . POLYPECTOMY  05/06/2015   Procedure: POLYPECTOMY;  Surgeon: Lucilla Lame, MD;  Location: [place];  Service: Endoscopy;;  . RHINOPLASTY  1992   deviated septum due to volleyball  . RHINOPLASTY  1992   foe deviated septm, traumatic  [facility]   . ROTATOR CUFF REPAIR  1991   Dr. Marry Guan  . SHOULDER ARTHROSCOPY W/ ROTATOR CUFF REPAIR  1991   [facility]    Social History   Socioeconomic History  . Marital status: Married    Spouse name: Not on file  . Number of children: Not on file  . Years of education: Not on file  . Highest education level: Not on file  Occupational History  . Not on file  Tobacco Use  . Smoking status: Never Smoker  . Smokeless tobacco: Never Used  Vaping Use  . Vaping Use: Never used  Substance and Sexual Activity  . Alcohol use: Yes    Comment: rare - once /year  . Drug use: No  . Sexual activity: Yes  Other Topics Concern  . Not on file  Social History Narrative  . Not on file   Social Determinants of Health   Financial Resource Strain:   . Difficulty of Paying Living Expenses:   Food Insecurity:   . Worried About Charity fundraiser in the Last Year:   . Arboriculturist in the Last Year:   Transportation Needs:   . Film/video editor (Medical):   Marland Kitchen Lack of Transportation (Non-Medical):   Physical Activity:   . Days of Exercise per Week:   . Minutes of Exercise per Session:   Stress:   . Feeling of Stress :   Social Connections:   . Frequency of Communication with Friends and Family:   . Frequency of Social Gatherings with Friends and Family:   . Attends Religious Services:   . Active Member of Clubs or Organizations:   . Attends Archivist Meetings:   Marland Kitchen Marital Status:   Intimate Partner Violence:   . Fear of Current or Ex-Partner:   . Emotionally Abused:   Marland Kitchen Physically Abused:   . Sexually Abused:     Family History  Problem Relation Age of Onset  . Diabetes Mother   .  Mental illness Mother        Dementia  . COPD Father   . Hyperlipidemia Father   . Mental illness Father        Parkinson's Disease     Current Outpatient Medications:  .  B Complex-C (B-COMPLEX WITH VITAMIN C) tablet, Take 1 tablet by mouth daily., Disp: , Rfl:  .  cholecalciferol (VITAMIN D3) 25 MCG (1000 UNIT) tablet, Take 1,000 Units by mouth daily., Disp: , Rfl:  .  clobetasol (TEMOVATE) 0.05 % external solution, MIX WITH A JAR OF CERAVE CREAM AND  APP EXT AA BID, Disp: , Rfl:  .  ferrous sulfate 325 (65 FE) MG EC tablet, Take 325 mg by mouth daily with breakfast., Disp: , Rfl:  .  hydrOXYzine (ATARAX/VISTARIL) 25 MG tablet, TK 1 T PO HS PRF ITCHING. MAY MAKE DROWSY, Disp: ,  Rfl:  .  ibuprofen (ADVIL) 400 MG tablet, Take 400 mg by mouth every 6 (six) hours as needed., Disp: , Rfl:  .  Multiple Vitamin (MULTIVITAMIN) tablet, Take 1 tablet by mouth daily., Disp: , Rfl:  .  ondansetron (ZOFRAN ODT) 4 MG disintegrating tablet, Take 1 tablet (4 mg total) by mouth every 8 (eight) hours as needed for nausea or vomiting., Disp: 20 tablet, Rfl: 0 .  ondansetron (ZOFRAN) 4 MG tablet, Take 1 tablet (4 mg total) by mouth every 8 (eight) hours as needed., Disp: 20 tablet, Rfl: 0 .  zinc gluconate 50 MG tablet, Take 50 mg by mouth daily., Disp: , Rfl:  .  zolpidem (AMBIEN) 10 MG tablet, TAKE ONE TABLET BY MOUTH EVERY NIGHT AT BEDTIME AS NEEDED FOR SLEEP, Disp: 30 tablet, Rfl: 4 .  diclofenac (VOLTAREN) 75 MG EC tablet, Take 1 tablet (75 mg total) by mouth 2 (two) times daily., Disp: 60 tablet, Rfl: 3 .  oxyCODONE-acetaminophen (PERCOCET) 5-325 MG tablet, Take 1 tablet by mouth every 6 (six) hours as needed for severe pain. (Patient not taking: Reported on 10/05/2019), Disp: 20 tablet, Rfl: 0  Physical exam:  Vitals:   12/04/19 0935  BP: (!) 123/94  Pulse: 71  Resp: 16  Temp: 97.6 F (36.4 C)  TempSrc: Tympanic  SpO2: 98%  Weight: 193 lb 12.8 oz (87.9 kg)   Physical Exam Constitutional:       General: He is not in acute distress. Pulmonary:     Effort: Pulmonary effort is normal.  Skin:    General: Skin is warm and dry.  Neurological:     Mental Status: He is alert and oriented to person, place, and time.      CMP Latest Ref Rng & Units 09/25/2019  Glucose 70 - 99 mg/dL -  BUN 6 - 23 mg/dL -  Creatinine 0.40 - 1.50 mg/dL -  Sodium 135 - 145 mEq/L -  Potassium 3.5 - 5.1 mEq/L -  Chloride 96 - 112 mEq/L -  CO2 19 - 32 mEq/L -  Calcium 8.4 - 10.5 mg/dL -  Total Protein 6.1 - 8.1 g/dL 7.6  Total Bilirubin 0.2 - 1.2 mg/dL -  Alkaline Phos 39 - 117 U/L -  AST 0 - 37 U/L -  ALT 0 - 53 U/L -   CBC Latest Ref Rng & Units 12/04/2019  WBC 4.0 - 10.5 K/uL 4.4  Hemoglobin 13.0 - 17.0 g/dL 12.9(L)  Hematocrit 39 - 52 % 38.4(L)  Platelets 150 - 400 K/uL 178      Assessment and plan- Patient is a 64 y.o. male with iron deficiency anemia here for routine follow-up  Patient's iron studies are not back at this time.  However hemoglobin is improved to 12.9 from a prior value of 10.6 in April 2021.  He still has some persistent microcytosis and it remains to be seen if his iron studies have normalized or not.  I will get back to him after these labs are back.  If he still has iron deficiency can increase his oral iron to twice a day.  Of course the question is why the hemoglobin which typically runs between 14-15 for him from down to 10.2 in March 2021.  He had a colonoscopy by Dr. Allen Norris which did not show any active bleeding and showed a 2 mm polyp in the cecum.  Ideally he needs a complete GI work-up for his iron deficiency including EGD plus minus capsule study.  Patient  would like to hold off on that at this time but consider it if he becomes iron deficient in the future.  I did tell him that while he is on oral iron his iron deficiency may be masked.  Patient understands and would like to see what his blood work looks like in 3 in 6 months and decide about further GI evaluation at  that time    Visit Diagnosis 1. Iron deficiency anemia, unspecified iron deficiency anemia type      Dr. Randa Evens, MD, MPH [facility] [phone] 12/04/2019 10:22 AM

## 2019-12-09 ENCOUNTER — Other Ambulatory Visit: Payer: Self-pay | Admitting: Dermatology

## 2019-12-10 ENCOUNTER — Other Ambulatory Visit: Payer: Self-pay

## 2019-12-10 MED ORDER — HYDROXYZINE HCL 25 MG PO TABS: ORAL_TABLET | ORAL | 2 refills | Status: DC

## 2019-12-10 MED ORDER — CLOBETASOL PROPIONATE 0.05 % EX SOLN: CUTANEOUS | 2 refills | Status: DC

## 2020-01-13 DIAGNOSIS — M79674 Pain in right toe(s): Secondary | ICD-10-CM

## 2020-02-08 ENCOUNTER — Other Ambulatory Visit: Payer: BC Managed Care – PPO

## 2020-02-08 ENCOUNTER — Other Ambulatory Visit: Payer: Self-pay | Admitting: Critical Care Medicine

## 2020-02-08 DIAGNOSIS — Z20822 Contact with and (suspected) exposure to covid-19: Secondary | ICD-10-CM

## 2020-02-09 LAB — NOVEL CORONAVIRUS, NAA: SARS-CoV-2, NAA: NOT DETECTED

## 2020-02-09 LAB — SARS-COV-2, NAA 2 DAY TAT

## 2020-03-04 ENCOUNTER — Other Ambulatory Visit: Payer: BC Managed Care – PPO

## 2020-04-13 ENCOUNTER — Other Ambulatory Visit: Payer: Self-pay

## 2020-04-13 NOTE — Telephone Encounter (Addendum)
RX refill: Ambien LS:09-25-19 LO:09-28-19

## 2020-04-13 NOTE — Telephone Encounter (Signed)
Refill request has been sent to Dr. Tullo.  

## 2020-04-13 NOTE — Telephone Encounter (Signed)
Pt called to check on the status of the refill for Zolpidem

## 2020-04-15 ENCOUNTER — Other Ambulatory Visit: Payer: Self-pay | Admitting: Internal Medicine

## 2020-04-15 MED ORDER — ZOLPIDEM TARTRATE 10 MG PO TABS
ORAL_TABLET | ORAL | 4 refills | Status: DC
Start: 2020-04-15 — End: 2020-10-10

## 2020-05-25 ENCOUNTER — Other Ambulatory Visit (INDEPENDENT_AMBULATORY_CARE_PROVIDER_SITE_OTHER): Payer: Managed Care, Other (non HMO)

## 2020-05-25 ENCOUNTER — Telehealth (INDEPENDENT_AMBULATORY_CARE_PROVIDER_SITE_OTHER): Payer: Managed Care, Other (non HMO) | Admitting: Family Medicine

## 2020-05-25 ENCOUNTER — Encounter: Payer: Self-pay | Admitting: Family Medicine

## 2020-05-25 ENCOUNTER — Other Ambulatory Visit: Payer: Self-pay

## 2020-05-25 VITALS — BP 131/80 | Temp 99.1°F | Ht 71.0 in | Wt 191.0 lb

## 2020-05-25 DIAGNOSIS — R0981 Nasal congestion: Secondary | ICD-10-CM | POA: Diagnosis not present

## 2020-05-25 LAB — POCT INFLUENZA A/B
Influenza A, POC: NEGATIVE
Influenza B, POC: NEGATIVE

## 2020-05-25 MED ORDER — ONDANSETRON HCL 4 MG PO TABS: 4.0000 mg | ORAL_TABLET | Freq: Three times a day (TID) | ORAL | 0 refills | Status: DC | PRN

## 2020-05-25 NOTE — Progress Notes (Signed)
Virtual Visit via telephone Note  This visit type was conducted due to national recommendations for restrictions regarding the COVID-19 pandemic (e.g. social distancing).  This format is felt to be most appropriate for this patient at this time.  All issues noted in this document were discussed and addressed.  No physical exam was performed (except for noted visual exam findings with Video Visits).   I connected with Seth Doyle today at 11:00 AM EST by a video enabled telemedicine application or telephone and verified that I am speaking with the correct person using two identifiers. Location patient: home Location provider: work Persons participating in the virtual visit: patient, provider  I discussed the limitations, risks, security and privacy concerns of performing an evaluation and management service by telephone and the availability of in person appointments. I also discussed with the patient that there may be a patient responsible charge related to this service. The patient expressed understanding and agreed to proceed.  Interactive audio and video telecommunications were attempted between this provider and patient, however failed, due to patient having technical difficulties OR patient did not have access to video capability.  We continued and completed visit with audio only.   Reason for visit: same day visit  HPI: Congestion: Patient presents with onset of symptoms on the evening of 05/23/2020.  Started with muscle aches and then developed headache as well as sore throat, cough, chills, and loss of smell.  He has had headaches with this.  He has stayed home from work since onset of symptoms.  He does note somebody at his work tested positive prior to Christmas though the patient had a test on 12/23 at the guidance of his office that was negative.  He notes no shortness of breath or taste disturbances.  He has been fully vaccinated against COVID-19 and has received his booster vaccine.   He has also been vomiting this morning.  He notes he is trying to drink tea, Gatorade, and Coke and notes he has good urine flow with light yellow urine.   ROS: See pertinent positives and negatives per HPI.  Past Medical History:  Diagnosis Date  . Chronic insomnia 03/26/2014   Managed with generic ambien.     . 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 Constipation 02/22/2015  . Diverticulitis   . Diverticulitis of sigmoid colon 02/04/2011  . Essential hypertension 04/27/2014   related to event. no longer on medicines.  . Hepatic steatosis 02/22/2015  . Kidney stone 08/20/2019  . Left rotator cuff tear 10/30/2013  . Reflux esophagitis 09/10/2012  . Subacromial bursitis 09/18/2013  . Urinary retention due to benign prostatic hyperplasia 04/27/2014  . Vertigo    3 episodes.  last one approx 6 mos ago  . Wears contact lenses     Past Surgical History:  Procedure Laterality Date  . COLON SURGERY  August 2012   sigmoid colectomy, diverticulitis  . COLONOSCOPY WITH PROPOFOL N/A 05/06/2015   Procedure: COLONOSCOPY WITH PROPOFOL;  Surgeon: Midge Minium, MD;  [facility];  Service: Endoscopy;  Laterality: N/A;  . COLONOSCOPY WITH PROPOFOL N/A 08/07/2019   Procedure: COLONOSCOPY WITH PROPOFOL;  Surgeon: Midge Minium, MD;  Location: [facility];  Service: Endoscopy;  Laterality: N/A;  . EYE SURGERY     tear duct repair   . POLYPECTOMY  05/06/2015   Procedure: POLYPECTOMY;  Surgeon: Midge Minium, MD;  Location: [facility];  Service: Endoscopy;;  . RHINOPLASTY  1992   deviated septum due to volleyball  .  RHINOPLASTY  1992   foe deviated septm, traumatic  [facility]   . ROTATOR CUFF REPAIR  1991   Dr. Marry Guan  . SHOULDER ARTHROSCOPY W/ ROTATOR CUFF REPAIR  1991   [facility]    Family History  Problem Relation Age of Onset  . Diabetes Mother   . Mental illness Mother        Dementia  . COPD Father   . Hyperlipidemia Father   . Mental illness Father         Parkinson's Disease    SOCIAL HX: Non-smoker   Current Outpatient Medications:  .  B Complex-C (B-COMPLEX WITH VITAMIN C) tablet, Take 1 tablet by mouth daily., Disp: , Rfl:  .  cholecalciferol (VITAMIN D3) 25 MCG (1000 UNIT) tablet, Take 1,000 Units by mouth daily., Disp: , Rfl:  .  clobetasol (TEMOVATE) 0.05 % external solution, MIX WITH A JAR OF CERAVE CREAM AND  APP EXT AA BID, Disp: 50 mL, Rfl: 2 .  hydrOXYzine (ATARAX/VISTARIL) 25 MG tablet, TK 1 T PO HS PRF ITCHING. MAY MAKE DROWSY, Disp: 30 tablet, Rfl: 2 .  ibuprofen (ADVIL) 400 MG tablet, Take 400 mg by mouth every 6 (six) hours as needed., Disp: , Rfl:  .  Multiple Vitamin (MULTIVITAMIN) tablet, Take 1 tablet by mouth daily., Disp: , Rfl:  .  ondansetron (ZOFRAN) 4 MG tablet, Take 1 tablet (4 mg total) by mouth every 8 (eight) hours as needed for nausea or vomiting., Disp: 20 tablet, Rfl: 0 .  zolpidem (AMBIEN) 10 MG tablet, TAKE ONE TABLET BY MOUTH EVERY NIGHT AT BEDTIME AS NEEDED FOR SLEEP, Disp: 30 tablet, Rfl: 4 .  zinc gluconate 50 MG tablet, Take 50 mg by mouth daily. (Patient not taking: Reported on 05/25/2020), Disp: , Rfl:   EXAM:  VITALS per patient if applicable:  GENERAL: alert, oriented, appears well and in no acute distress  HEENT: atraumatic, conjunttiva clear, no obvious abnormalities on inspection of external nose and ears  NECK: normal movements of the head and neck  LUNGS: on inspection no signs of respiratory distress, breathing rate appears normal, no obvious gross SOB, gasping or wheezing  CV: no obvious cyanosis  MS: moves all visible extremities without noticeable abnormality  PSYCH/NEURO: pleasant and cooperative, no obvious depression or anxiety, speech and thought processing grossly intact  ASSESSMENT AND PLAN:  Discussed the following assessment and plan:  Problem List Items Addressed This Visit    Head congestion    Symptoms are concerning for COVID-19 versus influenza.  We will arrange  for testing of both of those things later today.  Discussed strict quarantine precautions and advised to wear a mask around those in his house and try to isolate from them.  We will treat his nausea with Zofran.  Discussed he could take pseudoephedrine for any congestion.  Discussed use of Tylenol and ibuprofen over-the-counter alternating for his body aches and headaches.  Advise seeking medical attention if he develops shortness of breath, chest pain, or fever of 103 F or higher that does not improve with Tylenol or ibuprofen.      Relevant Medications   ondansetron (ZOFRAN) 4 MG tablet   Other Relevant Orders   POCT Influenza A/B   Novel Coronavirus, NAA ([facility])    Other Visit Diagnoses    Congestion of nasal sinus    -  Primary   Relevant Orders   POCT Influenza A/B   Novel Coronavirus, NAA ([facility])       I discussed the  assessment and treatment plan with the patient. The patient was provided an opportunity to ask questions and all were answered. The patient agreed with the plan and demonstrated an understanding of the instructions.   The patient was advised to call back or seek an in-person evaluation if the symptoms worsen or if the condition fails to improve as anticipated.  I provided 11 minutes of non-face-to-face time during this encounter.   Tommi Rumps, MD

## 2020-05-25 NOTE — Assessment & Plan Note (Signed)
Symptoms are concerning for COVID-19 versus influenza.  We will arrange for testing of both of those things later today.  Discussed strict quarantine precautions and advised to wear a mask around those in his house and try to isolate from them.  We will treat his nausea with Zofran.  Discussed he could take pseudoephedrine for any congestion.  Discussed use of Tylenol and ibuprofen over-the-counter alternating for his body aches and headaches.  Advise seeking medical attention if he develops shortness of breath, chest pain, or fever of 103 F or higher that does not improve with Tylenol or ibuprofen.

## 2020-05-26 ENCOUNTER — Other Ambulatory Visit: Payer: BC Managed Care – PPO

## 2020-05-27 LAB — NOVEL CORONAVIRUS, NAA: SARS-CoV-2, NAA: DETECTED — AB

## 2020-05-27 LAB — SARS-COV-2, NAA 2 DAY TAT

## 2020-06-07 ENCOUNTER — Other Ambulatory Visit: Payer: Managed Care, Other (non HMO)

## 2020-06-08 ENCOUNTER — Other Ambulatory Visit: Payer: Managed Care, Other (non HMO)

## 2020-06-08 DIAGNOSIS — Z20822 Contact with and (suspected) exposure to covid-19: Secondary | ICD-10-CM

## 2020-06-10 ENCOUNTER — Other Ambulatory Visit: Payer: BC Managed Care – PPO

## 2020-06-10 ENCOUNTER — Telehealth: Payer: BC Managed Care – PPO | Admitting: Oncology

## 2020-06-10 ENCOUNTER — Encounter: Payer: Self-pay | Admitting: Family Medicine

## 2020-06-10 LAB — NOVEL CORONAVIRUS, NAA: SARS-CoV-2, NAA: NOT DETECTED

## 2020-06-10 LAB — SARS-COV-2, NAA 2 DAY TAT

## 2020-06-16 ENCOUNTER — Inpatient Hospital Stay: Payer: Managed Care, Other (non HMO) | Attending: Oncology

## 2020-06-16 DIAGNOSIS — D509 Iron deficiency anemia, unspecified: Secondary | ICD-10-CM | POA: Diagnosis not present

## 2020-06-16 LAB — CBC WITH DIFFERENTIAL/PLATELET
Abs Immature Granulocytes: 0.05 10*3/uL (ref 0.00–0.07)
Basophils Absolute: 0 10*3/uL (ref 0.0–0.1)
Basophils Relative: 0 %
Eosinophils Absolute: 0.1 10*3/uL (ref 0.0–0.5)
Eosinophils Relative: 1 %
HCT: 34.9 % — ABNORMAL LOW (ref 39.0–52.0)
Hemoglobin: 12.3 g/dL — ABNORMAL LOW (ref 13.0–17.0)
Immature Granulocytes: 1 %
Lymphocytes Relative: 24 %
Lymphs Abs: 1.4 10*3/uL (ref 0.7–4.0)
MCH: 30.6 pg (ref 26.0–34.0)
MCHC: 35.2 g/dL (ref 30.0–36.0)
MCV: 86.8 fL (ref 80.0–100.0)
Monocytes Absolute: 0.7 10*3/uL (ref 0.1–1.0)
Monocytes Relative: 12 %
Neutro Abs: 3.7 10*3/uL (ref 1.7–7.7)
Neutrophils Relative %: 62 %
Platelets: 179 10*3/uL (ref 150–400)
RBC: 4.02 MIL/uL — ABNORMAL LOW (ref 4.22–5.81)
RDW: 12.2 % (ref 11.5–15.5)
WBC: 5.9 10*3/uL (ref 4.0–10.5)
nRBC: 0 % (ref 0.0–0.2)

## 2020-06-16 LAB — IRON AND TIBC
Iron: 32 ug/dL — ABNORMAL LOW (ref 45–182)
Saturation Ratios: 9 % — ABNORMAL LOW (ref 17.9–39.5)
TIBC: 349 ug/dL (ref 250–450)
UIBC: 317 ug/dL

## 2020-06-16 LAB — FERRITIN: Ferritin: 24 ng/mL (ref 24–336)

## 2020-06-16 LAB — VITAMIN B12: Vitamin B-12: 1157 pg/mL — ABNORMAL HIGH (ref 180–914)

## 2020-06-17 ENCOUNTER — Other Ambulatory Visit: Payer: Self-pay

## 2020-06-17 ENCOUNTER — Inpatient Hospital Stay (HOSPITAL_BASED_OUTPATIENT_CLINIC_OR_DEPARTMENT_OTHER): Payer: Managed Care, Other (non HMO) | Admitting: Oncology

## 2020-06-17 ENCOUNTER — Encounter: Payer: Self-pay | Admitting: Oncology

## 2020-06-17 DIAGNOSIS — D509 Iron deficiency anemia, unspecified: Secondary | ICD-10-CM

## 2020-06-24 NOTE — Progress Notes (Signed)
I connected with Seth Doyle on 06/24/20 at  2:45 PM EST by video enabled telemedicine visit and verified that I am speaking with the correct person using two identifiers.   I discussed the limitations, risks, security and privacy concerns of performing an evaluation and management service by telemedicine and the availability of in-person appointments. I also discussed with the patient that there may be a patient responsible charge related to this service. The patient expressed understanding and agreed to proceed.  Other persons participating in the visit and their role in the encounter:  none  Patient's location:  home Provider's location:  work  Risk analyst Complaint: Routine follow-up of iron deficiency anemia  History of present illness: Patient is a 65 year old male with a past medical history significant for BPH, hypogonadism referred for anemia. Patient's most recent CBC from 09/25/2019 showed white cell count of 5, H&H of 12.2/38.1 with an MCV of 77 and a platelet count of 257. Iron study showed a low ferritin of 21 and iron saturation of 11%. B12 levels were elevated at 1319 and folate was normal. SPEP did not show any M protein.Patient currently reports significant fatigue that has been ongoing since January. He has lost 17 pounds in the last 3 months. CT abdomen pelvis with contrast in January 2021 did not show any evidence of malignancy. He has a prior history of sigmoidal resection for suspected diverticulitis. He also underwent CT angio chest abdomen and pelvis in March 2021 which did not show any evidence of malignancy. No evidence of aneurysm or dissection. 2 mm obstructing calculus in the right ureter.Patient is taking oral iron  Interval history: Patient has some baseline fatigue.He also reports some symptoms of nasal congestion  Review of Systems  Constitutional: Positive for malaise/fatigue. Negative for chills, fever and weight loss.  HENT: Negative for congestion, ear  discharge and nosebleeds.   Eyes: Negative for blurred vision.  Respiratory: Negative for cough, hemoptysis, sputum production, shortness of breath and wheezing.   Cardiovascular: Negative for chest pain, palpitations, orthopnea and claudication.  Gastrointestinal: Negative for abdominal pain, blood in stool, constipation, diarrhea, heartburn, melena, nausea and vomiting.  Genitourinary: Negative for dysuria, flank pain, frequency, hematuria and urgency.  Musculoskeletal: Negative for back pain, joint pain and myalgias.  Skin: Negative for rash.  Neurological: Negative for dizziness, tingling, focal weakness, seizures, weakness and headaches.  Endo/Heme/Allergies: Does not bruise/bleed easily.  Psychiatric/Behavioral: Negative for depression and suicidal ideas. The patient does not have insomnia.     Allergies  Allergen Reactions  . Wellbutrin [Bupropion] Itching    Past Medical History:  Diagnosis Date  . Chronic insomnia 03/26/2014   Managed with generic ambien.     . 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 Constipation 02/22/2015  . Diverticulitis   . Diverticulitis of sigmoid colon 02/04/2011  . Essential hypertension 04/27/2014   related to event. no longer on medicines.  . Hepatic steatosis 02/22/2015  . Kidney stone 08/20/2019  . Left rotator cuff tear 10/30/2013  . Reflux esophagitis 09/10/2012  . Subacromial bursitis 09/18/2013  . Urinary retention due to benign prostatic hyperplasia 04/27/2014  . Vertigo    3 episodes.  last one approx 6 mos ago  . Wears contact lenses     Past Surgical History:  Procedure Laterality Date  . COLON SURGERY  August 2012   sigmoid colectomy, diverticulitis  . COLONOSCOPY WITH PROPOFOL N/A 05/06/2015   Procedure: COLONOSCOPY WITH PROPOFOL;  Surgeon: Lucilla Lame, MD;  Location: [place];  Service:  Endoscopy;  Laterality: N/A;  . COLONOSCOPY WITH PROPOFOL N/A 08/07/2019   Procedure: COLONOSCOPY WITH PROPOFOL;  Surgeon: Lucilla Lame, MD;  [facility];  Service: Endoscopy;  Laterality: N/A;  . EYE SURGERY     tear duct repair   . POLYPECTOMY  05/06/2015   Procedure: POLYPECTOMY;  Surgeon: Lucilla Lame, MD;  Location: [place];  Service: Endoscopy;;  . RHINOPLASTY  1992   deviated septum due to volleyball  . RHINOPLASTY  1992   foe deviated septm, traumatic  [facility]   . ROTATOR CUFF REPAIR  1991   Dr. Marry Guan  . SHOULDER ARTHROSCOPY W/ ROTATOR CUFF REPAIR  1991   [facility]    Social History   Socioeconomic History  . Marital status: Married    Spouse name: Not on file  . Number of children: Not on file  . Years of education: Not on file  . Highest education level: Not on file  Occupational History  . Not on file  Tobacco Use  . Smoking status: Never Smoker  . Smokeless tobacco: Never Used  Vaping Use  . Vaping Use: Never used  Substance and Sexual Activity  . Alcohol use: Yes    Comment: rare - once /year  . Drug use: No  . Sexual activity: Yes  Other Topics Concern  . Not on file  Social History Narrative  . Not on file   Social Determinants of Health   Financial Resource Strain: Not on file  Food Insecurity: Not on file  Transportation Needs: Not on file  Physical Activity: Not on file  Stress: Not on file  Social Connections: Not on file  Intimate Partner Violence: Not on file    Family History  Problem Relation Age of Onset  . Diabetes Mother   . Mental illness Mother        Dementia  . COPD Father   . Hyperlipidemia Father   . Mental illness Father        Parkinson's Disease     Current Outpatient Medications:  .  B Complex-C (B-COMPLEX WITH VITAMIN C) tablet, Take 1 tablet by mouth daily., Disp: , Rfl:  .  cholecalciferol (VITAMIN D3) 25 MCG (1000 UNIT) tablet, Take 1,000 Units by mouth daily., Disp: , Rfl:  .  clobetasol (TEMOVATE) 0.05 % external solution, MIX WITH A JAR OF CERAVE CREAM AND  APP EXT AA BID (Patient taking differently: as needed. MIX  WITH A JAR OF CERAVE CREAM AND  APP EXT AA BID), Disp: 50 mL, Rfl: 2 .  hydrOXYzine (ATARAX/VISTARIL) 25 MG tablet, TK 1 T PO HS PRF ITCHING. MAY MAKE DROWSY (Patient taking differently: as needed. TK 1 T PO HS PRF ITCHING. MAY MAKE DROWSY), Disp: 30 tablet, Rfl: 2 .  ibuprofen (ADVIL) 400 MG tablet, Take 400 mg by mouth every 6 (six) hours as needed., Disp: , Rfl:  .  Multiple Vitamin (MULTIVITAMIN) tablet, Take 1 tablet by mouth daily., Disp: , Rfl:  .  zinc gluconate 50 MG tablet, Take 50 mg by mouth daily., Disp: , Rfl:  .  zolpidem (AMBIEN) 10 MG tablet, TAKE ONE TABLET BY MOUTH EVERY NIGHT AT BEDTIME AS NEEDED FOR SLEEP (Patient taking differently: TAKES 1/2 TABLET BY MOUTH EVERY NIGHT AT BEDTIME AS NEEDED FOR SLEEP), Disp: 30 tablet, Rfl: 4 .  ondansetron (ZOFRAN) 4 MG tablet, Take 1 tablet (4 mg total) by mouth every 8 (eight) hours as needed for nausea or vomiting. (Patient not taking: Reported on 06/17/2020),  Disp: 20 tablet, Rfl: 0  No results found.  No images are attached to the encounter.   CMP Latest Ref Rng & Units 09/25/2019  Glucose 70 - 99 mg/dL -  BUN 6 - 23 mg/dL -  Creatinine 0.40 - 1.50 mg/dL -  Sodium 135 - 145 mEq/L -  Potassium 3.5 - 5.1 mEq/L -  Chloride 96 - 112 mEq/L -  CO2 19 - 32 mEq/L -  Calcium 8.4 - 10.5 mg/dL -  Total Protein 6.1 - 8.1 g/dL 7.6  Total Bilirubin 0.2 - 1.2 mg/dL -  Alkaline Phos 39 - 117 U/L -  AST 0 - 37 U/L -  ALT 0 - 53 U/L -   CBC Latest Ref Rng & Units 06/16/2020  WBC 4.0 - 10.5 K/uL 5.9  Hemoglobin 13.0 - 17.0 g/dL 12.3(L)  Hematocrit 39.0 - 52.0 % 34.9(L)  Platelets 150 - 400 K/uL 179     Observation/objective: Appears in no acute distress over video visit today.  Breathing is nonlabored  Assessment and plan: Patient is a 65 year old male with history of iron deficiency anemia and this is a routine follow-up visit  Patient's hemoglobin is around 12 over the last 9 months.  Ferritin levels are low at 24 with an iron  saturation of 9%.  B12 levels were elevated at 1000 137.  Patient is willing to try oral iron a little longer at this time he has not required any IV iron so far.  If iron levels remain persistently low in 3 months we will consider giving him IV iron at that time  Follow-up instructions: As above  I discussed the assessment and treatment plan with the patient. The patient was provided an opportunity to ask questions and all were answered. The patient agreed with the plan and demonstrated an understanding of the instructions.   The patient was advised to call back or seek an in-person evaluation if the symptoms worsen or if the condition fails to improve as anticipated.    Visit Diagnosis: 1. Iron deficiency anemia, unspecified iron deficiency anemia type     Dr. Randa Evens, MD, MPH [facility] at [facility] Tel- [phone] 06/24/2020 4:12 PM

## 2020-06-29 ENCOUNTER — Other Ambulatory Visit: Payer: Self-pay

## 2020-06-29 MED ORDER — HYDROXYZINE HCL 25 MG PO TABS: 25.0000 mg | ORAL_TABLET | ORAL | 0 refills | Status: DC | PRN

## 2020-06-29 MED ORDER — CLOBETASOL PROPIONATE 0.05 % EX SOLN: CUTANEOUS | 0 refills | Status: DC | PRN

## 2020-06-29 NOTE — Progress Notes (Signed)
Prescription RF request

## 2020-07-11 ENCOUNTER — Ambulatory Visit (INDEPENDENT_AMBULATORY_CARE_PROVIDER_SITE_OTHER): Payer: Managed Care, Other (non HMO) | Admitting: Internal Medicine

## 2020-07-11 ENCOUNTER — Other Ambulatory Visit: Payer: Self-pay

## 2020-07-11 ENCOUNTER — Encounter: Payer: Self-pay | Admitting: Internal Medicine

## 2020-07-11 VITALS — BP 130/80 | HR 86 | Temp 98.2°F | Resp 14 | Ht 71.0 in | Wt 204.8 lb

## 2020-07-11 DIAGNOSIS — R944 Abnormal results of kidney function studies: Secondary | ICD-10-CM

## 2020-07-11 DIAGNOSIS — F5104 Psychophysiologic insomnia: Secondary | ICD-10-CM

## 2020-07-11 DIAGNOSIS — N401 Enlarged prostate with lower urinary tract symptoms: Secondary | ICD-10-CM

## 2020-07-11 DIAGNOSIS — R5383 Other fatigue: Secondary | ICD-10-CM | POA: Diagnosis not present

## 2020-07-11 DIAGNOSIS — Z125 Encounter for screening for malignant neoplasm of prostate: Secondary | ICD-10-CM | POA: Diagnosis not present

## 2020-07-11 DIAGNOSIS — R3911 Hesitancy of micturition: Secondary | ICD-10-CM

## 2020-07-11 DIAGNOSIS — Z0000 Encounter for general adult medical examination without abnormal findings: Secondary | ICD-10-CM

## 2020-07-11 DIAGNOSIS — R338 Other retention of urine: Secondary | ICD-10-CM

## 2020-07-11 NOTE — Progress Notes (Signed)
Patient ID: Seth Doyle, male    DOB: March 10, 1956  Age: 65 y.o. MRN: [id number]  The patient is here for annual  wellness examination and management of other chronic and acute problems.  This visit occurred during the SARS-CoV-2 public health emergency.  Safety protocols were in place, including screening questions prior to the visit, additional usage of staff PPE, and extensive cleaning of exam room while observing appropriate contact time as indicated for disinfecting solutions.     The risk factors are reflected in the social history.  The roster of all physicians providing medical care to patient - is listed in the Snapshot section of the chart.  Activities of daily living:  The patient is 100% independent in all ADLs: dressing, toileting, feeding as well as independent mobility  Home safety : The patient has smoke detectors in the home. They wear seatbelts.  There are no firearms at home. There is no violence in the home.   There is no risks for hepatitis, STDs or HIV. There is no   history of blood transfusion. They have no travel history to infectious disease endemic areas of the world.  The patient has seen their dentist in the last six month. They have seen their eye doctor in the last year. They admit to slight hearing difficulty with regard to whispered voices and some television programs.  They have deferred audiologic testing in the last year.  They do not  have excessive sun exposure. Discussed the need for sun protection: hats, long sleeves and use of sunscreen if there is significant sun exposure.   Diet: the importance of a healthy diet is discussed. They do have a healthy diet.  The benefits of regular aerobic exercise were discussed. She walks 4 times per week ,  20 minutes.   Depression screen: there are no signs or vegative symptoms of depression- irritability, change in appetite, anhedonia, sadness/tearfullness.  Cognitive assessment: the patient manages all their  financial and personal affairs and is actively engaged. They could relate day,date,year and events; recalled 2/3 objects at 3 minutes; performed clock-face test normally.  The following portions of the patient's history were reviewed and updated as appropriate: allergies, current medications, past family history, past medical history,  past surgical history, past social history  and problem list.  Visual acuity was not assessed per patient preference since she has regular follow up with her ophthalmologist. Hearing and body mass index were assessed and reviewed.   During the course of the visit the patient was educated and counseled about appropriate screening and preventive services including : fall prevention , diabetes screening, nutrition counseling, colorectal cancer screening, and recommended immunizations.    CC: The primary encounter diagnosis was Urinary hesitancy. Diagnoses of Prostate cancer screening, Fatigue, unspecified type, Encounter for preventive health examination, Chronic insomnia, and Urinary retention due to benign prostatic hyperplasia were also pertinent to this visit.  HAD COVID INFECTION IN EARLY January,  MILD.  WIFE INFECTED NOW BUT ALREADY BACK TO WORK AT [employer]  Had COVID INFECTION in January. Mild to moderate sinus congestion,  Headache, anosmia. No lingering symptoms  Wife Seth Doyle is currently positive due to exposure to grandchildren positive .  Patient tested negative this week    Alberteen Spindle  For IDA.  Had been donating blood 5-6 times per year.  Now taking iron supplement once daily . Advised to reduce blood donations .  hgb was 13 n Jan 19 pre donation and was 12.3 one week later .  B12 was high,  Wife concerned about solid tumors.  Per patient he was taking a daily b12 supplement      Some urinary hesitancy in the morning and late at night, no flow issues during the day.  Bothered by inability  to build muscle, aging.  Not depressed. Actually quite happy in his new job   ,  Marriage is good,  Dog and kids are healthy.  Feels old.  Both shoulders are in need of replacement [facility]   S/p cortisone injections bilaterally august 2021 by [facility].  Not exercising vigorously   Changed jobs after 34 years.  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  [employer] has asked him to return.   Easy commute   Stopped b12 supplement in late January because  Wife was worried that high levels might be indicative of a solid tumor.      History Seth Doyle has a past medical history of Chronic insomnia (03/26/2014), CMC arthritis, thumb, degenerative (03/27/2013), Constipation (02/22/2015), Diverticulitis, Diverticulitis of sigmoid colon (02/04/2011), Essential hypertension (04/27/2014), Hepatic steatosis (02/22/2015), Kidney stone (08/20/2019), Left rotator cuff tear (10/30/2013), Reflux esophagitis (09/10/2012), Subacromial bursitis (09/18/2013), Urinary retention due to benign prostatic hyperplasia (04/27/2014), Vertigo, and Wears contact lenses.   He has a past surgical history that includes Rhinoplasty (1992); Rotator cuff repair (1991); Shoulder arthroscopy w/ rotator cuff repair (1991); Rhinoplasty (1992); Colon surgery (August 2012); Eye surgery; Colonoscopy with propofol (N/A, 05/06/2015); polypectomy (05/06/2015); and Colonoscopy with propofol (N/A, 08/07/2019).   His family history includes COPD in his father; Diabetes in his mother; Hyperlipidemia in his father; Mental illness in his father and mother.He reports that he has never smoked. He has never used smokeless tobacco. He reports current alcohol use. He reports that he does not use drugs.  Outpatient Medications Prior to Visit  Medication Sig Dispense Refill  . Ascorbic Acid (VITAMIN C) 100 MG tablet Take 100 mg by mouth daily.    . cholecalciferol (VITAMIN D3) 25 MCG (1000 UNIT) tablet Take 1,000 Units by mouth daily.    . clobetasol (TEMOVATE) 0.05 % external solution Apply topically as needed. MIX WITH A JAR OF CERAVE  CREAM AND  APP EXT AA BID 50 mL 0  . hydrOXYzine (ATARAX/VISTARIL) 25 MG tablet Take 1 tablet (25 mg total) by mouth as needed. TK 1 T PO HS PRF ITCHING. MAY MAKE DROWSY 30 tablet 0  . ibuprofen (ADVIL) 400 MG tablet Take 400 mg by mouth every 6 (six) hours as needed.    . Multiple Vitamin (MULTIVITAMIN) tablet Take 1 tablet by mouth daily.    . ondansetron (ZOFRAN) 4 MG tablet Take 1 tablet (4 mg total) by mouth every 8 (eight) hours as needed for nausea or vomiting. 20 tablet 0  . saw palmetto 160 MG capsule Take 160 mg by mouth 2 (two) times daily.    Marland Kitchen zinc gluconate 50 MG tablet Take 50 mg by mouth daily.    Marland Kitchen zolpidem (AMBIEN) 10 MG tablet TAKE ONE TABLET BY MOUTH EVERY NIGHT AT BEDTIME AS NEEDED FOR SLEEP (Patient taking differently: TAKES 1/2 TABLET BY MOUTH EVERY NIGHT AT BEDTIME AS NEEDED FOR SLEEP) 30 tablet 4  . B Complex-C (B-COMPLEX WITH VITAMIN C) tablet Take 1 tablet by mouth daily. (Patient not taking: Reported on 07/11/2020)     No facility-administered medications prior to visit.    Review of Systems   Patient denies headache, fevers, malaise, unintentional weight loss, skin rash, eye pain, sinus congestion and sinus pain, sore throat,  dysphagia,  hemoptysis , cough, dyspnea, wheezing, chest pain, palpitations, orthopnea, edema, abdominal pain, nausea, melena, diarrhea, constipation, flank pain, dysuria, hematuria, urinary  Frequency, nocturia, numbness, tingling, seizures,  Focal weakness, Loss of consciousness,  Tremor, insomnia, depression, anxiety, and suicidal ideation.      Objective:  BP 130/80 (BP Location: Left Arm, Patient Position: Sitting, Cuff Size: Normal)   Pulse 86   Temp 98.2 F (36.8 C) (Oral)   Resp 14   Ht 5\' 11"  (1.803 m)   Wt 204 lb 12.8 oz (92.9 kg)   SpO2 96%   BMI 28.56 kg/m   Physical Exam  General appearance: alert, cooperative and appears stated age Ears: normal TM's and external ear canals both ears Throat: lips, mucosa, and tongue  normal; teeth and gums normal Neck: no adenopathy, no carotid bruit, supple, symmetrical, trachea midline and thyroid not enlarged, symmetric, no tenderness/mass/nodules Back: symmetric, no curvature. ROM normal. No CVA tenderness. Lungs: clear to auscultation bilaterally Heart: regular rate and rhythm, S1, S2 normal, no murmur, click, rub or gallop Abdomen: soft, non-tender; bowel sounds normal; no masses,  no organomegaly Pulses: 2+ and symmetric Skin: Skin color, texture, turgor normal. No rashes or lesions Lymph nodes: Cervical, supraclavicular, and axillary nodes normal.  Assessment & Plan:   Problem List Items Addressed This Visit      Unprioritized   Urinary retention due to benign prostatic hyperplasia    Checking urine for signs of infection today      Encounter for preventive health examination    age appropriate education and counseling updated, referrals for preventative services and immunizations addressed, dietary and smoking counseling addressed, most recent labs reviewed.  I have personally reviewed and have noted:  1) the patient's medical and social history 2) The pt's use of alcohol, tobacco, and illicit drugs 3) The patient's current medications and supplements 4) Functional ability including ADL's, fall risk, home safety risk, hearing and visual impairment 5) Diet and physical activities 6) Evidence for depression or mood disorder 7) The patient's height, weight, and BMI have been recorded in the chart  I have made referrals, and provided counseling and education based on review of the above      Chronic insomnia    Managed with ambien.  Refills given.  Encouraged to use sparingly in favor of non pharmcologic methods of relaxation        Other Visit Diagnoses    Urinary hesitancy    -  Primary   Relevant Orders   Urinalysis, Routine w reflex microscopic (Completed)   Urine Culture   Prostate cancer screening       Relevant Orders   PSA (Completed)    Fatigue, unspecified type       Relevant Orders   Comprehensive metabolic panel (Completed)      I have discontinued Athanasios B. Gadbois "Charlie"'s B-complex with vitamin C. I am also having him maintain his zinc gluconate, cholecalciferol, multivitamin, ibuprofen, zolpidem, ondansetron, clobetasol, hydrOXYzine, vitamin C, and saw palmetto.  No orders of the defined types were placed in this encounter.   Medications Discontinued During This Encounter  Medication Reason  . B Complex-C (B-COMPLEX WITH VITAMIN C) tablet     Follow-up: No follow-ups on file.   Crecencio Mc, MD

## 2020-07-11 NOTE — Patient Instructions (Signed)
Continue to suspend B12 and we will recheck your level in May

## 2020-07-12 LAB — URINALYSIS, ROUTINE W REFLEX MICROSCOPIC
Bilirubin Urine: NEGATIVE
Hgb urine dipstick: NEGATIVE
Ketones, ur: NEGATIVE
Leukocytes,Ua: NEGATIVE
Nitrite: NEGATIVE
Specific Gravity, Urine: 1.02 (ref 1.000–1.030)
Total Protein, Urine: NEGATIVE
Urine Glucose: NEGATIVE
Urobilinogen, UA: 0.2 (ref 0.0–1.0)
pH: 6.5 (ref 5.0–8.0)

## 2020-07-12 LAB — COMPREHENSIVE METABOLIC PANEL
ALT: 19 U/L (ref 0–53)
AST: 21 U/L (ref 0–37)
Albumin: 4.5 g/dL (ref 3.5–5.2)
Alkaline Phosphatase: 66 U/L (ref 39–117)
BUN: 23 mg/dL (ref 6–23)
CO2: 29 mEq/L (ref 19–32)
Calcium: 10.7 mg/dL — ABNORMAL HIGH (ref 8.4–10.5)
Chloride: 102 mEq/L (ref 96–112)
Creatinine, Ser: 1.38 mg/dL (ref 0.40–1.50)
GFR: 54.64 mL/min — ABNORMAL LOW (ref >60.00)
Glucose, Bld: 84 mg/dL (ref 70–99)
Potassium: 4.3 mEq/L (ref 3.5–5.1)
Sodium: 138 mEq/L (ref 135–145)
Total Bilirubin: 0.5 mg/dL (ref 0.2–1.2)
Total Protein: 7.2 g/dL (ref 6.0–8.3)

## 2020-07-12 LAB — PSA: PSA: 0.93 ng/mL (ref 0.10–4.00)

## 2020-07-12 LAB — URINE CULTURE
MICRO NUMBER:: 11558615
Result:: NO GROWTH
SPECIMEN QUALITY:: ADEQUATE

## 2020-07-12 NOTE — Assessment & Plan Note (Signed)
Checking urine for signs of infection today

## 2020-07-12 NOTE — Assessment & Plan Note (Signed)
Managed with ambien.  Refills given.  Encouraged to use sparingly in favor of non pharmcologic methods of relaxation

## 2020-07-12 NOTE — Assessment & Plan Note (Signed)

## 2020-07-13 ENCOUNTER — Encounter: Payer: Self-pay | Admitting: Internal Medicine

## 2020-07-13 NOTE — Addendum Note (Signed)
Addended by: Crecencio Mc on: 07/13/2020 09:22 PM   Modules accepted: Orders

## 2020-07-18 ENCOUNTER — Telehealth: Payer: Self-pay | Admitting: Internal Medicine

## 2020-07-18 NOTE — Telephone Encounter (Signed)
Spoke with pt and he stated that he is not having any pain or burning.

## 2020-07-18 NOTE — Telephone Encounter (Signed)
Does pt need to have urine rechecked or just the CMP?

## 2020-07-18 NOTE — Telephone Encounter (Signed)
Patient called to make an appointment for a urine sample per [place]. Please put lab order into chart. Patient is scheduled to come into office on Friday morning for this. Thanks

## 2020-07-18 NOTE — Telephone Encounter (Signed)
Urine is not needed unless he has been having flank pain or burning

## 2020-07-22 ENCOUNTER — Other Ambulatory Visit: Payer: Self-pay

## 2020-07-22 ENCOUNTER — Other Ambulatory Visit (INDEPENDENT_AMBULATORY_CARE_PROVIDER_SITE_OTHER): Payer: Managed Care, Other (non HMO)

## 2020-07-22 DIAGNOSIS — R944 Abnormal results of kidney function studies: Secondary | ICD-10-CM | POA: Diagnosis not present

## 2020-07-22 LAB — BASIC METABOLIC PANEL
BUN: 27 mg/dL — ABNORMAL HIGH (ref 6–23)
CO2: 27 mEq/L (ref 19–32)
Calcium: 9.4 mg/dL (ref 8.4–10.5)
Chloride: 104 mEq/L (ref 96–112)
Creatinine, Ser: 1.22 mg/dL (ref 0.40–1.50)
GFR: 63.33 mL/min (ref >60.00)
Glucose, Bld: 102 mg/dL — ABNORMAL HIGH (ref 70–99)
Potassium: 4.1 mEq/L (ref 3.5–5.1)
Sodium: 138 mEq/L (ref 135–145)

## 2020-08-23 ENCOUNTER — Other Ambulatory Visit: Payer: Self-pay

## 2020-08-23 ENCOUNTER — Ambulatory Visit: Payer: Managed Care, Other (non HMO) | Admitting: Dermatology

## 2020-08-24 ENCOUNTER — Encounter: Payer: Self-pay | Admitting: Dermatology

## 2020-08-24 ENCOUNTER — Ambulatory Visit (INDEPENDENT_AMBULATORY_CARE_PROVIDER_SITE_OTHER): Payer: Managed Care, Other (non HMO) | Admitting: Dermatology

## 2020-08-24 ENCOUNTER — Other Ambulatory Visit: Payer: Self-pay

## 2020-08-24 DIAGNOSIS — L299 Pruritus, unspecified: Secondary | ICD-10-CM

## 2020-08-24 DIAGNOSIS — L739 Follicular disorder, unspecified: Secondary | ICD-10-CM | POA: Diagnosis not present

## 2020-08-24 DIAGNOSIS — L853 Xerosis cutis: Secondary | ICD-10-CM | POA: Diagnosis not present

## 2020-08-24 DIAGNOSIS — L570 Actinic keratosis: Secondary | ICD-10-CM | POA: Diagnosis not present

## 2020-08-24 DIAGNOSIS — D1800 Hemangioma unspecified site: Secondary | ICD-10-CM

## 2020-08-24 DIAGNOSIS — L578 Other skin changes due to chronic exposure to nonionizing radiation: Secondary | ICD-10-CM | POA: Diagnosis not present

## 2020-08-24 DIAGNOSIS — L821 Other seborrheic keratosis: Secondary | ICD-10-CM

## 2020-08-24 DIAGNOSIS — L814 Other melanin hyperpigmentation: Secondary | ICD-10-CM

## 2020-08-24 MED ORDER — HYDROXYZINE HCL 25 MG PO TABS
25.0000 mg | ORAL_TABLET | ORAL | 1 refills | Status: DC | PRN
Start: 2020-08-24 — End: 2021-09-13

## 2020-08-24 MED ORDER — CLOBETASOL PROPIONATE 0.05 % EX SOLN: CUTANEOUS | 1 refills | Status: DC | PRN

## 2020-08-24 NOTE — Patient Instructions (Addendum)
Eczema Skin Care  Buy TWO 16oz jars of CeraVe moisturizing cream  [facility], [facility], [facility] (no prescription needed)  Costs about $15 per jar   Jar #1: Use as a moisturizer as needed. Can be applied to any area of the body. Use twice daily to unaffected areas.  Jar #2: Pour one 8ml bottle of clobetasol 0.05% solution into jar, mix well. Label this jar to indicate the medication has been added. Use twice daily to affected areas. Do not apply to face, groin or underarms.  Moisturizer may burn or sting initially. Try for at least 4 weeks.   Dry Skin Care  What causes dry skin?  Dry skin is common and results from inadequate moisture in the outer skin layers. Dry skin usually results from the excessive loss of moisture from the skin surface. This occurs due to two major factors: 1. Normally the skin's oil glands deposit a layer of oil on the skin's surface. This layer of oil prevents the loss of moisture from the skin. Exposure to soaps, cleaners, solvents, and disinfectants removes this oily film, allowing water to escape. 2. Water loss from the skin increases when the humidity is low. During winter months we spend a lot of time indoors where the air is heated. Heated air has very low humidity. This also contributes to dry skin.  A tendency for dry skin may accompany such disorders as eczema. Also, as people age, the number of functioning oil glands decreases, and the tendency toward dry skin can be a sensation of skin tightness when emerging from the shower.  How do I manage dry skin?  1. Humidify your environment. This can be accomplished by using a humidifier in your bedroom at night during winter months. 2. Bathing can actually put moisture back into your skin if done right. Take the following steps while bathing to sooth dry skin:  Avoid hot water, which only dries the skin and makes itching worse. Use warm water.  Avoid washcloths or extensive rubbing or scrubbing.  Use mild soaps  like unscented Dove, Oil of Olay, Cetaphil, Basis, or CeraVe.  If you take baths rather than showers, rinse off soap residue with clean water before getting out of tub.  Once out of the shower/tub, pat dry gently with a soft towel. Leave your skin damp.  While still damp, apply any medicated ointment/cream you were prescribed to the affected areas. After you apply your medicated ointment/cream, then apply your moisturizer to your whole body.This is the most important step in dry skin care. If this is omitted, your skin will continue to be dry.  The choice of moisturizer is also very important. In general, lotion will not provider enough moisture to severely dry skin because it is water based. You should use an ointment or cream. Moisturizers should also be unscented. Good choices include Vaseline (plain petrolatum), Aquaphor, Cetaphil, CeraVe, Vanicream, DML Forte, Aveeno moisture, or Eucerin Cream.  Bath oils can be helpful, but do not replace the application of moisturizer after the bath. In addition, they make the tub slippery causing an increased risk for falls. Therefore, we do not recommend their use.   Cryotherapy Aftercare  . Wash gently with soap and water everyday.   Marland Kitchen Apply Vaseline and Band-Aid daily until healed.    If you have any questions or concerns for your doctor, please call our main line at [phone] and press option 4 to reach your doctor's medical assistant. If no one answers, please leave a voicemail as  directed and we will return your call as soon as possible. Messages left after 4 pm will be answered the following business day.   You may also send [place] a message via [place]. We typically respond to MyChart messages within 1-2 business days.  For prescription refills, please ask your pharmacy to contact our office. Our fax number is [phone].  If you have an urgent issue when the clinic is closed that cannot wait until the next business day, you can page your  doctor at the number below.    Please note that while we do our best to be available for urgent issues outside of office hours, we are not available 24/7.   If you have an urgent issue and are unable to reach [place], you may choose to seek medical care at your doctor's office, retail clinic, urgent care center, or emergency room.  If you have a medical emergency, please immediately call 911 or go to the emergency department.  Pager Numbers  - Dr. Nehemiah Massed: [phone]  - Dr. Laurence Ferrari: [phone]  - Dr. Nicole Kindred: [phone]  In the event of inclement weather, please call our main line at [phone] for an update on the status of any delays or closures.  Dermatology Medication Tips: Please keep the boxes that topical medications come in in order to help keep track of the instructions about where and how to use these. Pharmacies typically print the medication instructions only on the boxes and not directly on the medication tubes.   If your medication is too expensive, please contact our office at [phone] option 4 or send [place] a message through [place].   We are unable to tell what your co-pay for medications will be in advance as this is different depending on your insurance coverage. However, we may be able to find a substitute medication at lower cost or fill out paperwork to get insurance to cover a needed medication.   If a prior authorization is required to get your medication covered by your insurance company, please allow [place] 1-2 business days to complete this process.  Drug prices often vary depending on where the prescription is filled and some pharmacies may offer cheaper prices.  The website www.goodrx.com contains coupons for medications through different pharmacies. The prices here do not account for what the cost may be with help from insurance (it may be cheaper with your insurance), but the website can give you the price if you did not use any insurance.  - You can print  the associated coupon and take it with your prescription to the pharmacy.  - You may also stop by our office during regular business hours and pick up a GoodRx coupon card.  - If you need your prescription sent electronically to a different pharmacy, notify our office through [facility] or by phone at [phone] option 4.

## 2020-08-24 NOTE — Progress Notes (Signed)
Follow-Up Visit   Subjective  Seth Doyle is a 65 y.o. male who presents for the following: Annual Exam (Patient here for UBSE. He has a history of AKs.He has a history of itchy skin. He has used hydroxyzine and clobetasol/CeraVe mixture in the past. He would like refills. No spots of concern today.).   The following portions of the chart were reviewed this encounter and updated as appropriate:       Review of Systems:  No other skin or systemic complaints except as noted in HPI or Assessment and Plan.  Objective  Well appearing patient in no apparent distress; mood and affect are within normal limits.  All skin waist up examined.  Objective  Chest, right shoulder: Erythema with mild scale of the chest, right shoulder.  Objective  trunk: Follicular-based papules.   Objective  L vertex scalp x 1, R forehead x 3, frontal scalp x 1 (5): Erythematous thin papules/macules with gritty scale.    Assessment & Plan  Xerosis cutis Chest, right shoulder  With pruritus  Continue Clobetasol solution mixed in CeraVe Cream Apply qd/bid Aas prn itch dsp 9mL 1Rf. Avoid face, groin, axilla.  Continue hydroxyzine 25mg  take 1 po qhs prn itch dsp #30 1Rf.  Recommend mild soap and moisturizing cream 1-2 times daily.  Gentle skin care handout provided.   Topical steroids (such as triamcinolone, fluocinolone, fluocinonide, mometasone, clobetasol, halobetasol, betamethasone, hydrocortisone) can cause thinning and lightening of the skin if they are used for too long in the same area. Your physician has selected the right strength medicine for your problem and area affected on the body. Please use your medication only as directed by your physician to prevent side effects.      Folliculitis trunk  Discussed Rx topical antibiotic lotion. Patient defers today.  Benign, observe.    AK (actinic keratosis) (5) L vertex scalp x 1, R forehead x 3, frontal scalp x 1  Prior to  procedure, discussed risks of blister formation, small wound, skin dyspigmentation, or rare scar following cryotherapy.     Destruction of lesion - L vertex scalp x 1, R forehead x 3, frontal scalp x 1  Destruction method: cryotherapy   Informed consent: discussed and consent obtained   Lesion destroyed using liquid nitrogen: Yes   Region frozen until ice ball extended beyond lesion: Yes   Outcome: patient tolerated procedure well with no complications   Post-procedure details: wound care instructions given    Skin cancer screening performed today.  Actinic Damage - chronic, secondary to cumulative UV radiation exposure/sun exposure over time - diffuse scaly erythematous macules with underlying dyspigmentation - Recommend daily broad spectrum sunscreen SPF 30+ to sun-exposed areas, reapply every 2 hours as needed.  - Recommend staying in the shade or wearing long sleeves, sun glasses (UVA+UVB protection) and wide brim hats (4-inch brim around the entire circumference of the hat). - Call for new or changing lesions.  Lentigines - Scattered tan macules - Due to sun exposure - Benign-appering, observe - Recommend daily broad spectrum sunscreen SPF 30+ to sun-exposed areas, reapply every 2 hours as needed. - Call for any changes  Seborrheic Keratoses - Stuck-on, waxy, tan-brown papules and/or plaques  - Benign-appearing - Discussed benign etiology and prognosis. - Observe - Call for any changes  Hemangiomas - Red papules - Discussed benign nature - Observe - Call for any changes   Return in about 1 year (around 08/24/2021) for UBSE.   Lindi Adie, CMA, am  acting as scribe for Brendolyn Patty, MD .  Documentation: I have reviewed the above documentation for accuracy and completeness, and I agree with the above.  Brendolyn Patty MD

## 2020-09-15 ENCOUNTER — Inpatient Hospital Stay: Payer: Managed Care, Other (non HMO)

## 2020-09-16 ENCOUNTER — Telehealth: Payer: Self-pay | Admitting: Internal Medicine

## 2020-09-16 MED ORDER — OLOPATADINE HCL 0.1 % OP SOLN: 1.0000 [drp] | Freq: Two times a day (BID) | OPHTHALMIC | 12 refills | Status: DC

## 2020-09-16 NOTE — Telephone Encounter (Signed)
Spoke with pt about the medication that was sent in. Pt gave a verbal understanding.

## 2020-09-16 NOTE — Telephone Encounter (Signed)
Pt stated that for a couple of days he has woke up with both his eyes watery and crusty. Pt stated that both eyes are red and itchy/burning. Is not aware that he has been around anybody with pink eye he just thought it was due to the pollen. Pt is wanting to know if something can be called in or if there is something that he should try OTC. No available appts.

## 2020-09-16 NOTE — Telephone Encounter (Signed)
PT woke up this morning with his eye lids feeling matted and would like to know if something can be called in. He thought it was probably due to pollen but is not sure. He states his BP was 125/75 and he weighed hisself at 198.

## 2020-09-16 NOTE — Addendum Note (Signed)
Addended by: Crecencio Mc on: 09/16/2020 04:44 PM   Modules accepted: Orders

## 2020-09-16 NOTE — Telephone Encounter (Signed)
patanol  Eye solution for allergic conjunctivitis sent to [facility]

## 2020-10-04 ENCOUNTER — Ambulatory Visit: Payer: Managed Care, Other (non HMO) | Admitting: Dermatology

## 2020-10-05 ENCOUNTER — Encounter: Payer: Self-pay | Admitting: Adult Health

## 2020-10-05 ENCOUNTER — Telehealth (INDEPENDENT_AMBULATORY_CARE_PROVIDER_SITE_OTHER): Payer: Managed Care, Other (non HMO) | Admitting: Adult Health

## 2020-10-05 VITALS — BP 140/79 | Ht 70.98 in | Wt 203.0 lb

## 2020-10-05 DIAGNOSIS — J0141 Acute recurrent pansinusitis: Secondary | ICD-10-CM

## 2020-10-05 MED ORDER — CLINDAMYCIN HCL 150 MG PO CAPS: 150.0000 mg | ORAL_CAPSULE | Freq: Three times a day (TID) | ORAL | 0 refills | Status: DC

## 2020-10-05 NOTE — Progress Notes (Signed)
Virtual Visit via Video Note  I connected with Seth Doyle on 10/05/20 at  4:00 PM EDT by a video enabled telemedicine application and verified that I am speaking with the correct person using two identifiers. Parties involved in visit as below:   Location: Patient: at home  Provider: Provider: Provider's office at  [facility], [place].      I discussed the limitations of evaluation and management by telemedicine and the availability of in person appointments. The patient expressed understanding and agreed to proceed.  History of Present Illness: Patient has had sore throat and congestion, 3 weeks ago. Has had congestion and ear infection in left ear- was treated in urgent care- given Augmentin for 7 days. Ear improved. He did fly to [place] then. Covid tested at home due to still sick and he was negative.  Eye allergies has improved he reports with the eye drops for allergies Dr. Derrel Nip sent in. He then followed up with Dr. Tami Ribas who gave him prednisone for fluid in ear.  He is using Flonase.  He reports cough, congestion. Productive sputum - green in color continues.  He has fatigue mild and just " feels like it will not go away completely yet". Patient  denies any fever, body aches,chills, rash, chest pain, shortness of breath, nausea, vomiting, or diarrhea.  Denies dizziness, lightheadedness, pre syncopal or syncopal episodes.    Observations/Objective:   Patient is alert and oriented and responsive to questions Engages in conversation with provider. Speaks in full sentences without any pauses without any shortness of breath or distress.  Assessment and Plan:  The encounter diagnosis was Acute recurrent pansinusitis.   He prefers clindamycin in past, discussed C difficile risk as he was previously on Augmentin. Declined doxycycline.  Meds ordered this encounter  Medications  . clindamycin (CLEOCIN) 150 MG capsule    Sig: Take 1 capsule  (150 mg total) by mouth 3 (three) times daily.    Dispense:  21 capsule    Refill:  0   Follow Up Instructions:    I discussed the assessment and treatment plan with the patient. The patient was provided an opportunity to ask questions and all were answered. The patient agreed with the plan and demonstrated an understanding of the instructions.   The patient was advised to call back or seek an in-person evaluation if the symptoms worsen or if the condition fails to improve as anticipated.  Return in about 1 week (around 10/12/2020), or if symptoms worsen or fail to improve, for at any time for any worsening symptoms, Go to Emergency room/ urgent care if worse.   Red Flags discussed. The patient was given clear instructions to go to ER or return to medical center if any red flags develop, symptoms do not improve, worsen or new problems develop. They verbalized understanding.  Advised in person evaluation at anytime is advised if any symptoms do not improve, worsen or change at any given time.  Red Flags discussed. The patient was given clear instructions to go to ER or return to medical center if any red flags develop, symptoms do not improve, worsen or new problems develop. They verbalized understanding.  Marcille Buffy, FNP

## 2020-10-05 NOTE — Patient Instructions (Signed)
Clindamycin capsules What is this medicine? CLINDAMYCIN ([place] sin) is a lincosamide antibiotic. It is used to treat certain kinds of bacterial infections. It will not work for colds, flu, or other viral infections. This medicine may be used for other purposes; ask your health care provider or pharmacist if you have questions. COMMON BRAND NAME(S): Cleocin What should I tell my health care provider before I take this medicine? They need to know if you have any of these conditions:  kidney disease  liver disease  stomach problems like colitis  an unusual or allergic reaction to clindamycin, lincomycin, or other medicines, foods, dyes like tartrazine or preservatives  pregnant or trying to get pregnant  breast-feeding How should I use this medicine? Take this medicine by mouth with a full glass of water. Follow the directions on the prescription label. You can take this medicine with food or on an empty stomach. If the medicine upsets your stomach, take it with food. Take your medicine at regular intervals. Do not take your medicine more often than directed. Take all of your medicine as directed even if you think your are better. Do not skip doses or stop your medicine early. Talk to your pediatrician regarding the use of this medicine in children. Special care may be needed. Overdosage: If you think you have taken too much of this medicine contact a poison control center or emergency room at once. NOTE: This medicine is only for you. Do not share this medicine with others. What if I miss a dose? If you miss a dose, take it as soon as you can. If it is almost time for your next dose, take only that dose. Do not take double or extra doses. What may interact with this medicine?  birth control pills  erythromycin  medicines that relax muscles for surgery  rifampin This list may not describe all possible interactions. Give your health care provider a list of all the medicines,  herbs, non-prescription drugs, or dietary supplements you use. Also tell them if you smoke, drink alcohol, or use illegal drugs. Some items may interact with your medicine. What should I watch for while using this medicine? Tell your doctor or health care provider if your symptoms do not start to get better or if they get worse. This medicine may cause serious skin reactions. They can happen weeks to months after starting the medicine. Contact your health care provider right away if you notice fevers or flu-like symptoms with a rash. The rash may be red or purple and then turn into blisters or peeling of the skin. Or, you might notice a red rash with swelling of the face, lips or lymph nodes in your neck or under your arms. Do not treat diarrhea with over the counter products. Contact your doctor if you have diarrhea that lasts more than 2 days or if it is severe and watery. What side effects may I notice from receiving this medicine? Side effects that you should report to your doctor or health care professional as soon as possible:  allergic reactions like skin rash, itching or hives, swelling of the face, lips, or tongue  dark urine  pain on swallowing  rash, fever, and swollen lymph nodes  redness, blistering, peeling or loosening of the skin, including inside the mouth  unusual bleeding or bruising  unusually weak or tired  yellowing of eyes or skin Side effects that usually do not require medical attention (report to your doctor or health care professional  if they continue or are bothersome):  diarrhea  itching in the rectal or genital area  joint pain  nausea, vomiting  stomach pain This list may not describe all possible side effects. Call your doctor for medical advice about side effects. You may report side effects to FDA at 1-800-FDA-1088. Where should I keep my medicine? Keep out of the reach of children. Store at room temperature between 20 and 25 degrees C (68 and 77  degrees F). Throw away any unused medicine after the expiration date. NOTE: This sheet is a summary. It may not cover all possible information. If you have questions about this medicine, talk to your doctor, pharmacist, or health care provider.  2021 Elsevier/Gold Standard (2018-08-07 12:02:12) Sinusitis, Adult Sinusitis is soreness and swelling (inflammation) of your sinuses. Sinuses are hollow spaces in the bones around your face. They are located:  Around your eyes.  In the middle of your forehead.  Behind your nose.  In your cheekbones. Your sinuses and nasal passages are lined with a fluid called mucus. Mucus drains out of your sinuses. Swelling can trap mucus in your sinuses. This lets germs (bacteria, virus, or fungus) grow, which leads to infection. Most of the time, this condition is caused by a virus. What are the causes? This condition is caused by:  Allergies.  Asthma.  Germs.  Things that block your nose or sinuses.  Growths in the nose (nasal polyps).  Chemicals or irritants in the air.  Fungus (rare). What increases the risk? You are more likely to develop this condition if:  You have a weak body defense system (immune system).  You do a lot of swimming or diving.  You use nasal sprays too much.  You smoke. What are the signs or symptoms? The main symptoms of this condition are pain and a feeling of pressure around the sinuses. Other symptoms include:  Stuffy nose (congestion).  Runny nose (drainage).  Swelling and warmth in the sinuses.  Headache.  Toothache.  A cough that may get worse at night.  Mucus that collects in the throat or the back of the nose (postnasal drip).  Being unable to smell and taste.  Being very tired (fatigue).  A fever.  Sore throat.  Bad breath. How is this diagnosed? This condition is diagnosed based on:  Your symptoms.  Your medical history.  A physical exam.  Tests to find out if your condition is  short-term (acute) or long-term (chronic). Your doctor may: ? Check your nose for growths (polyps). ? Check your sinuses using a tool that has a light (endoscope). ? Check for allergies or germs. ? Do imaging tests, such as an MRI or CT scan. How is this treated? Treatment for this condition depends on the cause and whether it is short-term or long-term.  If caused by a virus, your symptoms should go away on their own within 10 days. You may be given medicines to relieve symptoms. They include: ? Medicines that shrink swollen tissue in the nose. ? Medicines that treat allergies (antihistamines). ? A spray that treats swelling of the nostrils. ? Rinses that help get rid of thick mucus in your nose (nasal saline washes).  If caused by bacteria, your doctor may wait to see if you will get better without treatment. You may be given antibiotic medicine if you have: ? A very bad infection. ? A weak body defense system.  If caused by growths in the nose, you may need to have surgery. Follow  these instructions at home: Medicines  Take, use, or apply over-the-counter and prescription medicines only as told by your doctor. These may include nasal sprays.  If you were prescribed an antibiotic medicine, take it as told by your doctor. Do not stop taking the antibiotic even if you start to feel better. Hydrate and humidify  Drink enough water to keep your pee (urine) pale yellow.  Use a cool mist humidifier to keep the humidity level in your home above 50%.  Breathe in steam for 10-15 minutes, 3-4 times a day, or as told by your doctor. You can do this in the bathroom while a hot shower is running.  Try not to spend time in cool or dry air.   Rest  Rest as much as you can.  Sleep with your head raised (elevated).  Make sure you get enough sleep each night. General instructions  Put a warm, moist washcloth on your face 3-4 times a day, or as often as told by your doctor. This will help  with discomfort.  Wash your hands often with soap and water. If there is no soap and water, use hand sanitizer.  Do not smoke. Avoid being around people who are smoking (secondhand smoke).  Keep all follow-up visits as told by your doctor. This is important.   Contact a doctor if:  You have a fever.  Your symptoms get worse.  Your symptoms do not get better within 10 days. Get help right away if:  You have a very bad headache.  You cannot stop throwing up (vomiting).  You have very bad pain or swelling around your face or eyes.  You have trouble seeing.  You feel confused.  Your neck is stiff.  You have trouble breathing. Summary  Sinusitis is swelling of your sinuses. Sinuses are hollow spaces in the bones around your face.  This condition is caused by tissues in your nose that become inflamed or swollen. This traps germs. These can lead to infection.  If you were prescribed an antibiotic medicine, take it as told by your doctor. Do not stop taking it even if you start to feel better.  Keep all follow-up visits as told by your doctor. This is important. This information is not intended to replace advice given to you by your health care provider. Make sure you discuss any questions you have with your health care provider. Document Revised: 10/07/2017 Document Reviewed: 10/07/2017 Elsevier Patient Education  2021 [employer].

## 2020-10-10 ENCOUNTER — Telehealth: Payer: Self-pay | Admitting: Internal Medicine

## 2020-10-10 MED ORDER — ZOLPIDEM TARTRATE 10 MG PO TABS: ORAL_TABLET | ORAL | 4 refills | Status: DC

## 2020-10-10 NOTE — Telephone Encounter (Signed)
Patient called in for refill for zolpidem (AMBIEN) 10 MG tablet 

## 2020-10-10 NOTE — Telephone Encounter (Signed)
RX Refill:ambien Last Seen:07-11-20 Last ordered:04-15-20

## 2020-10-14 ENCOUNTER — Other Ambulatory Visit: Payer: Self-pay

## 2020-10-14 ENCOUNTER — Inpatient Hospital Stay: Payer: Managed Care, Other (non HMO) | Attending: Oncology

## 2020-10-14 DIAGNOSIS — D509 Iron deficiency anemia, unspecified: Secondary | ICD-10-CM | POA: Diagnosis present

## 2020-10-14 LAB — IRON AND TIBC
Iron: 84 ug/dL (ref 45–182)
Saturation Ratios: 31 % (ref 17.9–39.5)
TIBC: 273 ug/dL (ref 250–450)
UIBC: 189 ug/dL

## 2020-10-14 LAB — CBC WITH DIFFERENTIAL/PLATELET
Abs Immature Granulocytes: 0.11 10*3/uL — ABNORMAL HIGH (ref 0.00–0.07)
Basophils Absolute: 0 10*3/uL (ref 0.0–0.1)
Basophils Relative: 0 %
Eosinophils Absolute: 0.1 10*3/uL (ref 0.0–0.5)
Eosinophils Relative: 1 %
HCT: 42.3 % (ref 39.0–52.0)
Hemoglobin: 14.7 g/dL (ref 13.0–17.0)
Immature Granulocytes: 2 %
Lymphocytes Relative: 25 %
Lymphs Abs: 1.7 10*3/uL (ref 0.7–4.0)
MCH: 30.6 pg (ref 26.0–34.0)
MCHC: 34.8 g/dL (ref 30.0–36.0)
MCV: 87.9 fL (ref 80.0–100.0)
Monocytes Absolute: 1.1 10*3/uL — ABNORMAL HIGH (ref 0.1–1.0)
Monocytes Relative: 15 %
Neutro Abs: 4 10*3/uL (ref 1.7–7.7)
Neutrophils Relative %: 57 %
Platelets: 139 10*3/uL — ABNORMAL LOW (ref 150–400)
RBC: 4.81 MIL/uL (ref 4.22–5.81)
RDW: 12.4 % (ref 11.5–15.5)
WBC: 6.9 10*3/uL (ref 4.0–10.5)
nRBC: 0 % (ref 0.0–0.2)

## 2020-10-14 LAB — FERRITIN: Ferritin: 145 ng/mL (ref 24–336)

## 2020-12-16 ENCOUNTER — Inpatient Hospital Stay: Payer: Managed Care, Other (non HMO) | Admitting: Oncology

## 2020-12-16 ENCOUNTER — Inpatient Hospital Stay: Payer: Managed Care, Other (non HMO) | Attending: Oncology

## 2020-12-16 ENCOUNTER — Encounter: Payer: Self-pay | Admitting: Oncology

## 2020-12-16 VITALS — BP 128/92 | HR 93 | Temp 97.9°F | Resp 16 | Wt 202.0 lb

## 2020-12-16 DIAGNOSIS — D509 Iron deficiency anemia, unspecified: Secondary | ICD-10-CM

## 2020-12-16 DIAGNOSIS — Z79899 Other long term (current) drug therapy: Secondary | ICD-10-CM | POA: Diagnosis not present

## 2020-12-16 DIAGNOSIS — K5900 Constipation, unspecified: Secondary | ICD-10-CM | POA: Insufficient documentation

## 2020-12-16 DIAGNOSIS — R5383 Other fatigue: Secondary | ICD-10-CM | POA: Diagnosis not present

## 2020-12-16 DIAGNOSIS — K449 Diaphragmatic hernia without obstruction or gangrene: Secondary | ICD-10-CM | POA: Diagnosis not present

## 2020-12-16 DIAGNOSIS — I10 Essential (primary) hypertension: Secondary | ICD-10-CM | POA: Diagnosis not present

## 2020-12-16 DIAGNOSIS — R42 Dizziness and giddiness: Secondary | ICD-10-CM | POA: Insufficient documentation

## 2020-12-16 DIAGNOSIS — N400 Enlarged prostate without lower urinary tract symptoms: Secondary | ICD-10-CM | POA: Diagnosis not present

## 2020-12-16 DIAGNOSIS — E291 Testicular hypofunction: Secondary | ICD-10-CM | POA: Insufficient documentation

## 2020-12-16 DIAGNOSIS — G4700 Insomnia, unspecified: Secondary | ICD-10-CM | POA: Insufficient documentation

## 2020-12-16 LAB — CBC WITH DIFFERENTIAL/PLATELET
Abs Immature Granulocytes: 0.07 10*3/uL (ref 0.00–0.07)
Basophils Absolute: 0 10*3/uL (ref 0.0–0.1)
Basophils Relative: 0 %
Eosinophils Absolute: 0 10*3/uL (ref 0.0–0.5)
Eosinophils Relative: 0 %
HCT: 43.3 % (ref 39.0–52.0)
Hemoglobin: 15.4 g/dL (ref 13.0–17.0)
Immature Granulocytes: 1 %
Lymphocytes Relative: 10 %
Lymphs Abs: 1.5 10*3/uL (ref 0.7–4.0)
MCH: 31.5 pg (ref 26.0–34.0)
MCHC: 35.6 g/dL (ref 30.0–36.0)
MCV: 88.5 fL (ref 80.0–100.0)
Monocytes Absolute: 1.2 10*3/uL — ABNORMAL HIGH (ref 0.1–1.0)
Monocytes Relative: 8 %
Neutro Abs: 12 10*3/uL — ABNORMAL HIGH (ref 1.7–7.7)
Neutrophils Relative %: 81 %
Platelets: 165 10*3/uL (ref 150–400)
RBC: 4.89 MIL/uL (ref 4.22–5.81)
RDW: 12 % (ref 11.5–15.5)
WBC: 14.8 10*3/uL — ABNORMAL HIGH (ref 4.0–10.5)
nRBC: 0 % (ref 0.0–0.2)

## 2020-12-16 LAB — IRON AND TIBC
Iron: 52 ug/dL (ref 45–182)
Saturation Ratios: 16 % — ABNORMAL LOW (ref 17.9–39.5)
TIBC: 328 ug/dL (ref 250–450)
UIBC: 276 ug/dL

## 2020-12-16 LAB — FERRITIN: Ferritin: 66 ng/mL (ref 24–336)

## 2020-12-16 NOTE — Progress Notes (Signed)
Patient here for follow up no concerns today. 

## 2020-12-17 DIAGNOSIS — G8929 Other chronic pain: Secondary | ICD-10-CM

## 2020-12-17 NOTE — Progress Notes (Signed)
Hematology/Oncology Consult note [facility]  Telephone:([phone] [phone]  Patient Care Team: Crecencio Mc, MD as PCP - General (Internal Medicine)   Name of the patient: Seth Doyle  [id number]  1955/09/15   Date of visit: 12/17/20  Diagnosis-iron deficiency anemia  Chief complaint/ Reason for visit-routine follow-up of iron deficiency anemia  Heme/Onc history: Patient is a 65 year old male with a past medical history significant for BPH, hypogonadism referred for anemia.  Patient's most recent CBC from 09/25/2019 showed white cell count of 5, H&H of 12.2/38.1 with an MCV of 77 and a platelet count of 257.  Iron study showed a low ferritin of 21 and iron saturation of 11%.  B12 levels were elevated at 1319 and folate was normal.  SPEP did not show any M protein.  Patient currently reports significant fatigue that has been ongoing since January. He has lost 17 pounds in the last 3 months.  CT abdomen pelvis with contrast in January 2021 did not show any evidence of malignancy.  He has a prior history of sigmoidal resection for suspected diverticulitis.  He also underwent CT angio chest abdomen and pelvis in March 2021 which did not show any evidence of malignancy.  No evidence of aneurysm or dissection.  2 mm obstructing calculus in the right ureter.    Patient underwent colonoscopy in March 2021 which showed a 2 mm polyp in the cecum and end-to-side colocolonic anastomosis with healthy-appearing mucosa.  Repeat colonoscopy was recommended in 5 years.  Last EGD was in 2016 which showed small hiatal hernia and gastritis.  He has not had a capsule endoscopy  Interval history-reports doing well presently and denies any specific complaints at this time.  Denies any bleeding in his tumor or urine.  Denies any dark melanotic stools.  He is not presently taking any oral iron  ECOG PS- 1 Pain scale- 0   Review of systems- Review of Systems   Constitutional:  Negative for chills, fever, malaise/fatigue and weight loss.  HENT:  Negative for congestion, ear discharge and nosebleeds.   Eyes:  Negative for blurred vision.  Respiratory:  Negative for cough, hemoptysis, sputum production, shortness of breath and wheezing.   Cardiovascular:  Negative for chest pain, palpitations, orthopnea and claudication.  Gastrointestinal:  Negative for abdominal pain, blood in stool, constipation, diarrhea, heartburn, melena, nausea and vomiting.  Genitourinary:  Negative for dysuria, flank pain, frequency, hematuria and urgency.  Musculoskeletal:  Negative for back pain, joint pain and myalgias.  Skin:  Negative for rash.  Neurological:  Negative for dizziness, tingling, focal weakness, seizures, weakness and headaches.  Endo/Heme/Allergies:  Does not bruise/bleed easily.  Psychiatric/Behavioral:  Negative for depression and suicidal ideas. The patient does not have insomnia.      Allergies  Allergen Reactions   Wellbutrin [Bupropion] Itching     Past Medical History:  Diagnosis Date   Actinic keratosis    Chronic insomnia 03/26/2014   Managed with generic ambien.      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 no longer on medicines.   Hepatic steatosis 02/22/2015   Kidney stone 08/20/2019   Left rotator cuff tear 10/30/2013   Reflux esophagitis 09/10/2012   Subacromial bursitis 09/18/2013   Urinary retention due to benign prostatic hyperplasia 04/27/2014   Vertigo    3 episodes.  last one  approx 6 mos ago   Wears contact lenses      Past Surgical History:  Procedure Laterality Date   COLON SURGERY  August 2012   sigmoid colectomy, diverticulitis   COLONOSCOPY WITH PROPOFOL N/A 05/06/2015   Procedure: COLONOSCOPY WITH PROPOFOL;  Surgeon: Lucilla Lame, MD;  Location: [facility];   Service: Endoscopy;  Laterality: N/A;   COLONOSCOPY WITH PROPOFOL N/A 08/07/2019   Procedure: COLONOSCOPY WITH PROPOFOL;  Surgeon: Lucilla Lame, MD;  Location: [facility] ENDOSCOPY;  Service: Endoscopy;  Laterality: N/A;   EYE SURGERY     tear duct repair    POLYPECTOMY  05/06/2015   Procedure: POLYPECTOMY;  Surgeon: Lucilla Lame, MD;  Location: [facility];  Service: Endoscopy;;   RHINOPLASTY  1992   deviated septum due to volleyball   [place]   foe deviated septm, traumatic  [place]    ROTATOR CUFF REPAIR  1991   Dr. Marry Guan   SHOULDER ARTHROSCOPY W/ [place]    Social History   Socioeconomic History   Marital status: Married    Spouse name: Not on file   Number of children: Not on file   Years of education: Not on file   Highest education level: Not on file  Occupational History   Not on file  Tobacco Use   Smoking status: Never   Smokeless tobacco: Never  Vaping Use   Vaping Use: Never used  Substance and Sexual Activity   Alcohol use: Yes    Comment: rare - once /year   Drug use: No   Sexual activity: Yes  Other Topics Concern   Not on file  Social History Narrative   Not on file   Social Determinants of Health   Financial Resource Strain: Not on file  Food Insecurity: Not on file  Transportation Needs: Not on file  Physical Activity: Not on file  Stress: Not on file  Social Connections: Not on file  Intimate Partner Violence: Not on file    Family History  Problem Relation Age of Onset   Diabetes Mother    Mental illness Mother        Dementia   COPD Father    Hyperlipidemia Father    Mental illness Father        Parkinson's Disease     Current Outpatient Medications:    Ascorbic Acid (VITAMIN C) 100 MG tablet, Take 100 mg by mouth daily., Disp: , Rfl:    cholecalciferol (VITAMIN D3) 25 MCG (1000 UNIT) tablet, Take 1,000 Units by mouth daily., Disp: , Rfl:    clobetasol (TEMOVATE) 0.05 % external solution,  Apply topically as needed. MIX WITH A JAR OF CERAVE CREAM AND  APPLY TO AA 1-2 TIMES A DAY AS NEEDED FOR ITCHY RASH., Disp: 50 mL, Rfl: 1   fluticasone (FLONASE) 50 MCG/ACT nasal spray, Place into the nose., Disp: , Rfl:    hydrOXYzine (ATARAX/VISTARIL) 25 MG tablet, Take 1 tablet (25 mg total) by mouth as needed. TK 1 T PO HS PRF ITCHING. MAY MAKE DROWSY, Disp: 30 tablet, Rfl: 1   ibuprofen (ADVIL) 400 MG tablet, Take 400 mg by mouth every 6 (six) hours as needed., Disp: , Rfl:    Multiple Vitamin (MULTIVITAMIN) tablet, Take 1 tablet by mouth daily., Disp: , Rfl:    olopatadine (PATANOL) 0.1 % ophthalmic solution, Place 1 drop into both eyes 2 (two) times daily., Disp: 5 mL, Rfl: 12   ondansetron (ZOFRAN) 4  MG tablet, Take 1 tablet (4 mg total) by mouth every 8 (eight) hours as needed for nausea or vomiting., Disp: 20 tablet, Rfl: 0   saw palmetto 160 MG capsule, Take 160 mg by mouth 2 (two) times daily., Disp: , Rfl:    zinc gluconate 50 MG tablet, Take 50 mg by mouth daily., Disp: , Rfl:    zolpidem (AMBIEN) 10 MG tablet, TAKES 1/2 TABLET BY MOUTH EVERY NIGHT AT BEDTIME AS NEEDED FOR SLEEP, Disp: 30 tablet, Rfl: 4  Physical exam:  Vitals:   12/16/20 1418  BP: (!) 128/92  Pulse: 93  Resp: 16  Temp: 97.9 F (36.6 C)  TempSrc: Temporal  SpO2: 98%  Weight: 202 lb (91.6 kg)   Physical Exam Constitutional:      General: He is not in acute distress. Cardiovascular:     Rate and Rhythm: Normal rate and regular rhythm.     Heart sounds: Normal heart sounds.  Pulmonary:     Effort: Pulmonary effort is normal.     Breath sounds: Normal breath sounds.  Abdominal:     General: Bowel sounds are normal.     Palpations: Abdomen is soft.  Skin:    General: Skin is warm and dry.  Neurological:     Mental Status: He is alert and oriented to person, place, and time.     CMP Latest Ref Rng & Units 07/22/2020  Glucose 70 - 99 mg/dL 102(H)  BUN 6 - 23 mg/dL 27(H)  Creatinine 0.40 - 1.50 mg/dL  1.22  Sodium 135 - 145 mEq/L 138  Potassium 3.5 - 5.1 mEq/L 4.1  Chloride 96 - 112 mEq/L 104  CO2 19 - 32 mEq/L 27  Calcium 8.4 - 10.5 mg/dL 9.4  Total Protein 6.0 - 8.3 g/dL -  Total Bilirubin 0.2 - 1.2 mg/dL -  Alkaline Phos 39 - 117 U/L -  AST 0 - 37 U/L -  ALT 0 - 53 U/L -   CBC Latest Ref Rng & Units 12/16/2020  WBC 4.0 - 10.5 K/uL 14.8(H)  Hemoglobin 13.0 - 17.0 g/dL 15.4  Hematocrit 39.0 - 52.0 % 43.3  Platelets 150 - 400 K/uL 165     Assessment and plan- Patient is a 65 y.o. male here for routine follow-up of iron deficiency anemia  Patient's hemoglobin has remained stable between 14-15 for the last 3 months.  Prior to that it was around 12.  Microcytosis has resolved.  Ferritin levels are 66 today with an iron saturation of 16%.  He does not require any IV iron at this time.  He can continue taking oral iron and have his CBCs checked every 6 months with Dr. Derrel Nip  If there is any worsening anemia despite taking oral iron he can be referred to [place] in the future.  Also with regards to etiology of his iron deficiency anemia he did have a last colonoscopy in 2021.  If iron deficiency recurs he needs to get in touch with Dr. Allen Norris.  No f/u needed with me   Visit Diagnosis 1. Iron deficiency anemia, unspecified iron deficiency anemia type      Dr. Randa Evens, MD, MPH [facility] at [facility] [id number] 12/17/2020 6:00 PM

## 2021-02-03 ENCOUNTER — Other Ambulatory Visit: Payer: Self-pay

## 2021-02-03 ENCOUNTER — Encounter: Payer: Self-pay | Admitting: Internal Medicine

## 2021-02-03 ENCOUNTER — Ambulatory Visit: Payer: Managed Care, Other (non HMO) | Admitting: Internal Medicine

## 2021-02-03 VITALS — BP 150/84 | HR 88 | Temp 96.4°F | Ht 70.0 in | Wt 200.0 lb

## 2021-02-03 DIAGNOSIS — D509 Iron deficiency anemia, unspecified: Secondary | ICD-10-CM | POA: Diagnosis not present

## 2021-02-03 DIAGNOSIS — Z23 Encounter for immunization: Secondary | ICD-10-CM | POA: Diagnosis not present

## 2021-02-03 DIAGNOSIS — F5104 Psychophysiologic insomnia: Secondary | ICD-10-CM

## 2021-02-03 DIAGNOSIS — K760 Fatty (change of) liver, not elsewhere classified: Secondary | ICD-10-CM | POA: Diagnosis not present

## 2021-02-03 DIAGNOSIS — M1611 Unilateral primary osteoarthritis, right hip: Secondary | ICD-10-CM

## 2021-02-03 NOTE — Progress Notes (Signed)
Subjective:  Patient ID: Seth Doyle, male    DOB: 03/22/1956  Age: 65 y.o. MRN: [id number]  CC: The primary encounter diagnosis was Hepatic steatosis. Diagnoses of Iron deficiency anemia, unspecified iron deficiency anemia type, Need for immunization against influenza, Chronic insomnia, and Primary osteoarthritis of right hip were also pertinent to this visit.  HPI Seth Doyle presents for  Chief Complaint  Patient presents with   Follow-up   This visit occurred during the SARS-CoV-2 public health emergency.  Safety protocols were in place, including screening questions prior to the visit, additional usage of staff PPE, and extensive cleaning of exam room while observing appropriate contact time as indicated for disinfecting solutions.   Left hip pain clicking  , saw Dr Darryl Lent , planning to receive an I/A left hip injection next week . Told her had severe DJD.    Using NSAIDs    taking iron daily for history of anemia  HTN:  checking at home 0000000 highest systolic reading seen   Chronic insomnia:  using generic ambien    Outpatient Medications Prior to Visit  Medication Sig Dispense Refill   Ascorbic Acid (VITAMIN C) 100 MG tablet Take 100 mg by mouth daily.     cholecalciferol (VITAMIN D3) 25 MCG (1000 UNIT) tablet Take 1,000 Units by mouth daily.     clobetasol (TEMOVATE) 0.05 % external solution Apply topically as needed. MIX WITH A JAR OF CERAVE CREAM AND  APPLY TO AA 1-2 TIMES A DAY AS NEEDED FOR ITCHY RASH. 50 mL 1   hydrOXYzine (ATARAX/VISTARIL) 25 MG tablet Take 1 tablet (25 mg total) by mouth as needed. TK 1 T PO HS PRF ITCHING. MAY MAKE DROWSY 30 tablet 1   ibuprofen (ADVIL) 400 MG tablet Take 400 mg by mouth every 6 (six) hours as needed.     Multiple Vitamin (MULTIVITAMIN) tablet Take 1 tablet by mouth daily.     ondansetron (ZOFRAN) 4 MG tablet Take 1 tablet (4 mg total) by mouth every 8 (eight) hours as needed for nausea or vomiting. 20 tablet 0   saw palmetto  160 MG capsule Take 160 mg by mouth 2 (two) times daily.     zinc gluconate 50 MG tablet Take 50 mg by mouth daily.     zolpidem (AMBIEN) 10 MG tablet TAKES 1/2 TABLET BY MOUTH EVERY NIGHT AT BEDTIME AS NEEDED FOR SLEEP 30 tablet 4   fluticasone (FLONASE) 50 MCG/ACT nasal spray Place into the nose.     olopatadine (PATANOL) 0.1 % ophthalmic solution Place 1 drop into both eyes 2 (two) times daily. 5 mL 12   No facility-administered medications prior to visit.    Review of Systems;  Patient denies headache, fevers, malaise, unintentional weight loss, skin rash, eye pain, sinus congestion and sinus pain, sore throat, dysphagia,  hemoptysis , cough, dyspnea, wheezing, chest pain, palpitations, orthopnea, edema, abdominal pain, nausea, melena, diarrhea, constipation, flank pain, dysuria, hematuria, urinary  Frequency, nocturia, numbness, tingling, seizures,  Focal weakness, Loss of consciousness,  Tremor, insomnia, depression, anxiety, and suicidal ideation.      Objective:  BP (!) 150/84 (BP Location: Left Arm, Patient Position: Sitting, Cuff Size: Normal)   Pulse 88   Temp (!) 96.4 F (35.8 C) (Temporal)   Ht '5\' 10"'$  (1.778 m)   Wt 200 lb (90.7 kg)   SpO2 96%   BMI 28.70 kg/m   BP Readings from Last 3 Encounters:  02/03/21 (!) 150/84  12/16/20 (!) 128/92  10/05/20 140/79    Wt Readings from Last 3 Encounters:  02/03/21 200 lb (90.7 kg)  12/16/20 202 lb (91.6 kg)  10/05/20 203 lb (92.1 kg)    General appearance: alert, cooperative and appears stated age Ears: normal TM's and external ear canals both ears Throat: lips, mucosa, and tongue normal; teeth and gums normal Neck: no adenopathy, no carotid bruit, supple, symmetrical, trachea midline and thyroid not enlarged, symmetric, no tenderness/mass/nodules Back: symmetric, no curvature. ROM normal. No CVA tenderness. Lungs: clear to auscultation bilaterally Heart: regular rate and rhythm, S1, S2 normal, no murmur, click, rub or  gallop Abdomen: soft, non-tender; bowel sounds normal; no masses,  no organomegaly Pulses: 2+ and symmetric Skin: Skin color, texture, turgor normal. No rashes or lesions Lymph nodes: Cervical, supraclavicular, and axillary nodes normal.  Lab Results  Component Value Date   HGBA1C 5.7 09/20/2017   HGBA1C 5.7 01/06/2016    Lab Results  Component Value Date   CREATININE 1.22 07/22/2020   CREATININE 1.38 07/11/2020   CREATININE 1.22 09/25/2019    Lab Results  Component Value Date   WBC 14.8 (H) 12/16/2020   HGB 15.4 12/16/2020   HCT 43.3 12/16/2020   PLT 165 12/16/2020   GLUCOSE 102 (H) 07/22/2020   CHOL CANCELED 01/17/2018   TRIG CANCELED 01/17/2018   HDL CANCELED 01/17/2018   LDLDIRECT 69.0 02/28/2016   LDLCALC 90 12/07/2016   ALT 19 07/11/2020   AST 21 07/11/2020   NA 138 07/22/2020   K 4.1 07/22/2020   CL 104 07/22/2020   CREATININE 1.22 07/22/2020   BUN 27 (H) 07/22/2020   CO2 27 07/22/2020   TSH 1.239 10/05/2019   PSA 0.93 07/11/2020   HGBA1C 5.7 09/20/2017   MICROALBUR 0.7 02/28/2016    CT Angio Chest/Abd/Pel for Dissection W and/or Wo Contrast  Result Date: 08/18/2019 CLINICAL DATA:  Right lower quadrant abdominal pain EXAM: CT ANGIOGRAPHY CHEST, ABDOMEN AND PELVIS TECHNIQUE: Multidetector CT imaging through the chest, abdomen and pelvis was performed using the standard protocol during bolus administration of intravenous contrast. Multiplanar reconstructed images and MIPs were obtained and reviewed to evaluate the vascular anatomy. CONTRAST:  142m OMNIPAQUE IOHEXOL 350 MG/ML SOLN COMPARISON:  06/12/2019 FINDINGS: CTA CHEST FINDINGS Cardiovascular: This is a technically adequate evaluation of the thoracic aorta. No aneurysm or dissection. The heart is unremarkable without pericardial effusion. While not optimized for opacification of the pulmonary vasculature, there is sufficient contrast enhancement to exclude pulmonary emboli. Mediastinum/Nodes: No enlarged  mediastinal, hilar, or axillary lymph nodes. Thyroid gland, trachea, and esophagus demonstrate no significant findings. Lungs/Pleura: No airspace disease, effusion, or pneumothorax. Central airways are widely patent. Musculoskeletal: No acute displaced fracture. Reconstructed images demonstrate no additional findings. Review of the MIP images confirms the above findings. CTA ABDOMEN AND PELVIS FINDINGS VASCULAR Aorta: Normal caliber aorta without aneurysm, dissection, vasculitis or significant stenosis. Celiac: Patent without evidence of aneurysm, dissection, vasculitis or significant stenosis. SMA: Patent without evidence of aneurysm, dissection, vasculitis or significant stenosis. Renals: There are 2 renal arteries bilaterally, which are widely patent without significant atherosclerosis or stenosis. IMA: Patent without evidence of aneurysm, dissection, vasculitis or significant stenosis. Inflow: Patent without evidence of aneurysm, dissection, vasculitis or significant stenosis. Veins: No obvious venous abnormality within the limitations of this arterial phase study. Review of the MIP images confirms the above findings. NON-VASCULAR Hepatobiliary: No focal liver abnormality is seen. No gallstones, gallbladder wall thickening, or biliary dilatation. Pancreas: Unremarkable. No pancreatic ductal dilatation or surrounding inflammatory changes. Spleen:  Normal in size without focal abnormality. Heterogeneous enhancement related to arterial phase of exam. Adrenals/Urinary Tract: There is a punctate 2 mm obstructing mid right ureteral calculus reference image 134, with right-sided obstructive uropathy as result. Left kidney is unremarkable. Stable right adrenal adenoma. Left adrenal is normal. Bladder is unremarkable. Stomach/Bowel: Scattered colonic diverticulosis without diverticulitis. Normal appendix. No bowel obstruction or ileus. Postsurgical changes from prior partial sigmoid colon resection and reanastomosis.  Lymphatic: No pathologic adenopathy within the abdomen or pelvis. Reproductive: Prostate is unremarkable. Other: No abdominal wall hernia or abnormality. No abdominopelvic ascites. Musculoskeletal: No acute or destructive bony lesions. Reconstructed images demonstrate no additional findings. Review of the MIP images confirms the above findings. IMPRESSION: 1. No evidence of aneurysm or dissection within the thoracoabdominal aorta. 2. Punctate less than 2 mm obstructing calculus mid right ureter, with mild right-sided obstructive uropathy. 3. Diverticulosis without diverticulitis. 4. Otherwise no acute intrathoracic, intra-abdominal, or intrapelvic process. Electronically Signed   By: Randa Ngo M.D.   On: 08/18/2019 15:15    Assessment & Plan:   Problem List Items Addressed This Visit       Unprioritized   Chronic insomnia    Managed with ambien.  Refills given.  Encouraged to use sparingly in favor of non pharmcologic methods of relaxation       Hepatic steatosis - Primary    .  Current liver enzymes are normal and all modifiable risk factors  Have been addressed  Lab Results  Component Value Date   ALT 19 07/11/2020   AST 21 07/11/2020   ALKPHOS 66 07/11/2020   BILITOT 0.5 07/11/2020         Relevant Orders   Lipid panel   Comprehensive metabolic panel   Degenerative joint disease (DJD) of hip    He is scheduled for a joint injection next week , ordered by Aluicio      Iron deficiency anemia    Taking iron daily,  Hgb back to normal   Lab Results  Component Value Date   WBC 14.8 (H) 12/16/2020   HGB 15.4 12/16/2020   HCT 43.3 12/16/2020   MCV 88.5 12/16/2020   PLT 165 12/16/2020         Relevant Orders   CBC with Differential/Platelet   Iron, TIBC and Ferritin Panel   Other Visit Diagnoses     Need for immunization against influenza       Relevant Orders   Flu Vaccine QUAD 30moIM (Fluarix, Fluzone & Alfiuria Quad PF) (Completed)     No orders of the  defined types were placed in this encounter.   Medications Discontinued During This Encounter  Medication Reason   fluticasone (FLONASE) 50 MCG/ACT nasal spray    olopatadine (PATANOL) 0.1 % ophthalmic solution     Follow-up: No follow-ups on file.   TCrecencio Mc MD

## 2021-02-03 NOTE — Patient Instructions (Addendum)
  You can add up to 2000 mg of acetominophen (tylenol) every day safely  In divided doses (500 mg every 6 hours  Or 1000 mg every 12 hours.)  in addition to your advil   You can reduce iron to every other day and repeat your labs in 3 months

## 2021-02-03 NOTE — Assessment & Plan Note (Addendum)
Taking iron daily,  Hgb back to normal   Lab Results  Component Value Date   WBC 14.8 (H) 12/16/2020   HGB 15.4 12/16/2020   HCT 43.3 12/16/2020   MCV 88.5 12/16/2020   PLT 165 12/16/2020

## 2021-02-05 NOTE — Assessment & Plan Note (Signed)
Managed with ambien.  Refills given.  Encouraged to use sparingly in favor of non pharmcologic methods of relaxation

## 2021-02-05 NOTE — Assessment & Plan Note (Signed)
He is scheduled for a joint injection next week , ordered by Aluicio

## 2021-02-05 NOTE — Assessment & Plan Note (Signed)
.    Current liver enzymes are normal and all modifiable risk factors  Have been addressed  Lab Results  Component Value Date   ALT 19 07/11/2020   AST 21 07/11/2020   ALKPHOS 66 07/11/2020   BILITOT 0.5 07/11/2020

## 2021-03-02 ENCOUNTER — Telehealth: Payer: Managed Care, Other (non HMO) | Admitting: Internal Medicine

## 2021-03-02 ENCOUNTER — Encounter: Payer: Self-pay | Admitting: Internal Medicine

## 2021-03-02 VITALS — Ht 70.0 in | Wt 200.0 lb

## 2021-03-02 DIAGNOSIS — J40 Bronchitis, not specified as acute or chronic: Secondary | ICD-10-CM

## 2021-03-02 DIAGNOSIS — J069 Acute upper respiratory infection, unspecified: Secondary | ICD-10-CM | POA: Insufficient documentation

## 2021-03-02 DIAGNOSIS — R0981 Nasal congestion: Secondary | ICD-10-CM

## 2021-03-02 MED ORDER — ONDANSETRON HCL 4 MG PO TABS: 4.0000 mg | ORAL_TABLET | Freq: Three times a day (TID) | ORAL | 0 refills | Status: DC | PRN

## 2021-03-02 NOTE — Assessment & Plan Note (Addendum)
.    Symptoms started 2 days ago with sinus pain and pressure,  Purulent sputum, and negative home COVID test.  Continue sudafed and tylenol   Will add prednisone . Advised that there is no indication for  abx unless sinus symptoms persistent > 5 days.  Advised that if he  develops fevers,  .chest x ray needed and ordered

## 2021-03-02 NOTE — Progress Notes (Signed)
Virtual Visit via [employer] Note  This visit type was conducted due to national recommendations for restrictions regarding the COVID-19 pandemic (e.g. social distancing).  This format is felt to be most appropriate for this patient at this time.  All issues noted in this document were discussed and addressed.  No physical exam was performed (except for noted visual exam findings with Video Visits).   I connected withNAME@ on 03/02/21 at 10:00 AM EDT by a video enabled telemedicine application  and verified that I am speaking with the correct person using two identifiers. Location patient: home Location provider: work or home office Persons participating in the virtual visit: patient, provider  I discussed the limitations, risks, security and privacy concerns of performing an evaluation and management service by telephone and the availability of in person appointments. I also discussed with the patient that there may be a patient responsible charge related to this service. The patient expressed understanding and agreed to proceed.   Reason for visit: sinus congestion, pain and productive cough  HPI:  65 yr old healthy male presents with 2 day history of sneezing, rhinitis and sinus congestion . Cough is productive of purulent looking sputum,  but denies fevers, malaise , pleurisy and COVID test is negative. Taking tylenol and sudafed. Symptoms have improved slightly but he was worried about it "going into his chest"    ROS: See pertinent positives and negatives per HPI.  Past Medical History:  Diagnosis Date   Actinic keratosis    Chronic insomnia 03/26/2014   Managed with generic ambien.      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 no longer on medicines.   Hepatic steatosis 02/22/2015   Kidney stone 08/20/2019   Left rotator cuff tear  10/30/2013   Reflux esophagitis 09/10/2012   Subacromial bursitis 09/18/2013   Urinary retention due to benign prostatic hyperplasia 04/27/2014   Vertigo    3 episodes.  last one approx 6 mos ago   Wears contact lenses     Past Surgical History:  Procedure Laterality Date   COLON SURGERY  August 2012   sigmoid colectomy, diverticulitis   COLONOSCOPY WITH PROPOFOL N/A 05/06/2015   Procedure: COLONOSCOPY WITH PROPOFOL;  Surgeon: Lucilla Lame, MD;  Location: [facility];  Service: Endoscopy;  Laterality: N/A;   COLONOSCOPY WITH PROPOFOL N/A 08/07/2019   Procedure: COLONOSCOPY WITH PROPOFOL;  Surgeon: Lucilla Lame, MD;  Location: [facility];  Service: Endoscopy;  Laterality: N/A;   EYE SURGERY     tear duct repair    POLYPECTOMY  05/06/2015   Procedure: POLYPECTOMY;  Surgeon: Lucilla Lame, MD;  Location: [facility];  Service: Endoscopy;;   RHINOPLASTY  1992   deviated septum due to [place]   foe deviated septm, traumatic  [place]    ROTATOR CUFF REPAIR  1991   Dr. Marry Guan   SHOULDER ARTHROSCOPY W/ ROTATOR CUFF REPAIR  1991   [facility]    Family History  Problem Relation Age of Onset   Diabetes Mother    Mental illness Mother        Dementia   COPD Father    Hyperlipidemia Father    Mental illness Father        Parkinson's Disease    SOCIAL HX:  reports that he has never smoked. He has never used smokeless tobacco. He  reports current alcohol use. He reports that he does not use drugs.    Current Outpatient Medications:    Ascorbic Acid (VITAMIN C) 100 MG tablet, Take 100 mg by mouth daily., Disp: , Rfl:    cholecalciferol (VITAMIN D3) 25 MCG (1000 UNIT) tablet, Take 1,000 Units by mouth daily., Disp: , Rfl:    clobetasol (TEMOVATE) 0.05 % external solution, Apply topically as needed. MIX WITH A JAR OF CERAVE CREAM AND  APPLY TO AA 1-2 TIMES A DAY AS NEEDED FOR ITCHY RASH., Disp: 50 mL, Rfl: 1   hydrOXYzine (ATARAX/VISTARIL) 25 MG tablet, Take  1 tablet (25 mg total) by mouth as needed. TK 1 T PO HS PRF ITCHING. MAY MAKE DROWSY, Disp: 30 tablet, Rfl: 1   ibuprofen (ADVIL) 400 MG tablet, Take 400 mg by mouth every 6 (six) hours as needed., Disp: , Rfl:    Multiple Vitamin (MULTIVITAMIN) tablet, Take 1 tablet by mouth daily., Disp: , Rfl:    saw palmetto 160 MG capsule, Take 160 mg by mouth 2 (two) times daily., Disp: , Rfl:    zinc gluconate 50 MG tablet, Take 50 mg by mouth daily., Disp: , Rfl:    zolpidem (AMBIEN) 10 MG tablet, TAKES 1/2 TABLET BY MOUTH EVERY NIGHT AT BEDTIME AS NEEDED FOR SLEEP, Disp: 30 tablet, Rfl: 4   ondansetron (ZOFRAN) 4 MG tablet, Take 1 tablet (4 mg total) by mouth every 8 (eight) hours as needed for nausea or vomiting., Disp: 20 tablet, Rfl: 0  EXAM:  VITALS per patient if applicable:  GENERAL: alert, oriented, appears well and in no acute distress  HEENT: atraumatic, conjunttiva clear, no obvious abnormalities on inspection of external nose and ears  NECK: normal movements of the head and neck  LUNGS: on inspection no signs of respiratory distress, breathing rate appears normal, no obvious gross SOB, gasping or wheezing  CV: no obvious cyanosis  MS: moves all visible extremities without noticeable abnormality  PSYCH/NEURO: pleasant and cooperative, no obvious depression or anxiety, speech and thought processing grossly intact  ASSESSMENT AND PLAN:  Discussed the following assessment and plan:  Bronchitis - Plan: DG Chest 2 View  Head congestion - Plan: ondansetron (ZOFRAN) 4 MG tablet  Viral URI with cough  Viral URI with cough .  Symptoms started 2 days ago with sinus pain and pressure,  Purulent sputum, and negative home COVID test.  Continue sudafed and tylenol   Will add prednisone . Advised that there is no indication for  abx unless sinus symptoms persistent > 5 days.  Advised that if he  develops fevers,  .chest x ray needed and ordered     I discussed the assessment and treatment  plan with the patient. The patient was provided an opportunity to ask questions and all were answered. The patient agreed with the plan and demonstrated an understanding of the instructions.   The patient was advised to call back or seek an in-person evaluation if the symptoms worsen or if the condition fails to improve as anticipated.   I spent 20  minutes dedicated to the care of this patient on the date of this encounter to include pre-visit review of his medical history,  Face-to-face time with the patient , and post visit ordering of testing and therapeutics.    Crecencio Mc, MD

## 2021-05-15 ENCOUNTER — Encounter: Payer: Self-pay | Admitting: Internal Medicine

## 2021-05-16 NOTE — Telephone Encounter (Signed)
Refilled: 10/10/2020 Last OV: 02/03/2021 Next OV: not scheduled

## 2021-05-17 MED ORDER — ZOLPIDEM TARTRATE 10 MG PO TABS: ORAL_TABLET | ORAL | 4 refills | Status: DC

## 2021-05-29 ENCOUNTER — Encounter: Payer: Self-pay | Admitting: Nurse Practitioner

## 2021-05-29 ENCOUNTER — Telehealth (INDEPENDENT_AMBULATORY_CARE_PROVIDER_SITE_OTHER): Payer: Managed Care, Other (non HMO) | Admitting: Nurse Practitioner

## 2021-05-29 ENCOUNTER — Other Ambulatory Visit: Payer: Self-pay

## 2021-05-29 VITALS — BP 140/84 | Wt 203.0 lb

## 2021-05-29 DIAGNOSIS — R051 Acute cough: Secondary | ICD-10-CM

## 2021-05-29 DIAGNOSIS — J029 Acute pharyngitis, unspecified: Secondary | ICD-10-CM

## 2021-05-29 DIAGNOSIS — R0982 Postnasal drip: Secondary | ICD-10-CM | POA: Diagnosis not present

## 2021-05-29 DIAGNOSIS — G4489 Other headache syndrome: Secondary | ICD-10-CM

## 2021-05-29 MED ORDER — FLUTICASONE PROPIONATE 50 MCG/ACT NA SUSP: 2.0000 | Freq: Every day | NASAL | 0 refills | Status: DC

## 2021-05-29 NOTE — Assessment & Plan Note (Signed)
Can try to use some Flonase pending testing results.  Medication sent to pharmacy

## 2021-05-29 NOTE — Progress Notes (Signed)
Patient ID: Seth Doyle, male    DOB: 08/20/1955, 66 y.o.   MRN: [id number]  Virtual visit completed through [employer], a video enabled telemedicine application. Due to national recommendations of social distancing due to COVID-19, a virtual visit is felt to be most appropriate for this patient at this time. Reviewed limitations, risks, security and privacy concerns of performing a virtual visit and the availability of in person appointments. I also reviewed that there may be a patient responsible charge related to this service. The patient agreed to proceed.   Patient location: home Provider location: [place] at [facility], office Persons participating in this virtual visit: patient, provider   If any vitals were documented, they were collected by patient at home unless specified below.    BP 140/84 Comment: per patient   Wt 203 lb (92.1 kg)    BMI 29.13 kg/m    CC: Cough Subjective:   HPI: Seth Doyle is a 66 y.o. male presenting on 05/29/2021 for Cough (Sx started 05/28/20, nasal congestion, sore throat, post nasal drip, headache. Covid test negative this morning 05/29/20 at home and went to [facility] morning and did covid and flu test and results are pending)  Symtpoms staretd on 05/28/2021 At home covid was negative. He did go and got flu and Covid tested at [facility] today. Pending results Pfizer x2 and one booster No sick contacts OTC cold medicine with out great relief     Relevant past medical, surgical, family and social history reviewed and updated as indicated. Interim medical history since our last visit reviewed. Allergies and medications reviewed and updated. Outpatient Medications Prior to Visit  Medication Sig Dispense Refill   Ascorbic Acid (VITAMIN C) 100 MG tablet Take 100 mg by mouth daily.     cholecalciferol (VITAMIN D3) 25 MCG (1000 UNIT) tablet Take 1,000 Units by mouth daily.     clobetasol (TEMOVATE) 0.05 % external solution Apply topically as needed. MIX  WITH A JAR OF CERAVE CREAM AND  APPLY TO AA 1-2 TIMES A DAY AS NEEDED FOR ITCHY RASH. 50 mL 1   hydrOXYzine (ATARAX/VISTARIL) 25 MG tablet Take 1 tablet (25 mg total) by mouth as needed. TK 1 T PO HS PRF ITCHING. MAY MAKE DROWSY 30 tablet 1   ibuprofen (ADVIL) 400 MG tablet Take 400 mg by mouth every 6 (six) hours as needed.     Multiple Vitamin (MULTIVITAMIN) tablet Take 1 tablet by mouth daily.     ondansetron (ZOFRAN) 4 MG tablet Take 1 tablet (4 mg total) by mouth every 8 (eight) hours as needed for nausea or vomiting. 20 tablet 0   saw palmetto 160 MG capsule Take 160 mg by mouth 2 (two) times daily.     zinc gluconate 50 MG tablet Take 50 mg by mouth daily.     zolpidem (AMBIEN) 10 MG tablet TAKES 1/2 TABLET BY MOUTH EVERY NIGHT AT BEDTIME AS NEEDED FOR SLEEP 30 tablet 4   No facility-administered medications prior to visit.     Per HPI unless specifically indicated in ROS section below Review of Systems  Constitutional:  Positive for fatigue. Negative for chills and fever.  HENT:  Positive for congestion, postnasal drip (yellow green) and sore throat. Negative for ear discharge, ear pain, sinus pressure and sinus pain.   Respiratory:  Positive for cough (yellow green). Negative for shortness of breath.   Cardiovascular:  Negative for chest pain.  Gastrointestinal:  Negative for constipation, nausea and vomiting.  Musculoskeletal:  Positive for arthralgias and myalgias.  Neurological:  Positive for headaches.  Objective:  BP 140/84 Comment: per patient   Wt 203 lb (92.1 kg)    BMI 29.13 kg/m   Wt Readings from Last 3 Encounters:  05/29/21 203 lb (92.1 kg)  03/02/21 200 lb (90.7 kg)  02/03/21 200 lb (90.7 kg)       Physical exam: Gen: alert, NAD, not ill appearing Pulm: speaks in complete sentences without increased work of breathing Psych: normal mood, normal thought content      Results for orders placed or performed in visit on 12/16/20  Iron and TIBC  Result Value Ref  Range   Iron 52 45 - 182 ug/dL   TIBC 328 250 - 450 ug/dL   Saturation Ratios 16 (L) 17.9 - 39.5 %   UIBC 276 ug/dL  Ferritin  Result Value Ref Range   Ferritin 66 24 - 336 ng/mL  CBC with Differential/Platelet  Result Value Ref Range   WBC 14.8 (H) 4.0 - 10.5 K/uL   RBC 4.89 4.22 - 5.81 MIL/uL   Hemoglobin 15.4 13.0 - 17.0 g/dL   HCT 43.3 39.0 - 52.0 %   MCV 88.5 80.0 - 100.0 fL   MCH 31.5 26.0 - 34.0 pg   MCHC 35.6 30.0 - 36.0 g/dL   RDW 12.0 11.5 - 15.5 %   Platelets 165 150 - 400 K/uL   nRBC 0.0 0.0 - 0.2 %   Neutrophils Relative % 81 %   Neutro Abs 12.0 (H) 1.7 - 7.7 K/uL   Lymphocytes Relative 10 %   Lymphs Abs 1.5 0.7 - 4.0 K/uL   Monocytes Relative 8 %   Monocytes Absolute 1.2 (H) 0.1 - 1.0 K/uL   Eosinophils Relative 0 %   Eosinophils Absolute 0.0 0.0 - 0.5 K/uL   Basophils Relative 0 %   Basophils Absolute 0.0 0.0 - 0.1 K/uL   Immature Granulocytes 1 %   Abs Immature Granulocytes 0.07 0.00 - 0.07 K/uL   Assessment & Plan:   Problem List Items Addressed This Visit       Other   PND (post-nasal drip) - Primary    Can try to use some Flonase pending testing results.  Medication sent to pharmacy      Relevant Medications   fluticasone (FLONASE) 50 MCG/ACT nasal spray   Sore throat    Continue stay well-hydrated.  May continue over-the-counter analgesics as needed.  Also consider using warm salt water gargles for relief along with throat lozenges.      Other headache syndrome    Continue using over-the-counter analgesics as needed and staying hydrated.      Acute cough    Offer patient cough medication.  States he has leftover cough syrup from his primary care provider that he can use if needed.        Meds ordered this encounter  Medications   fluticasone (FLONASE) 50 MCG/ACT nasal spray    Sig: Place 2 sprays into both nostrils daily.    Dispense:  16 g    Refill:  0    Order Specific Question:   Supervising Provider    Answer:   TOWER, MARNE A  [[id number]]   No orders of the defined types were placed in this encounter.   I discussed the assessment and treatment plan with the patient. The patient was provided an opportunity to ask questions and all were answered. The patient agreed with the plan and demonstrated an  understanding of the instructions. The patient was advised to call back or seek an in-person evaluation if the symptoms worsen or if the condition fails to improve as anticipated.  Follow up plan: No follow-ups on file.  Romilda Garret, NP

## 2021-05-29 NOTE — Assessment & Plan Note (Signed)
Continue stay well-hydrated.  May continue over-the-counter analgesics as needed.  Also consider using warm salt water gargles for relief along with throat lozenges.

## 2021-05-29 NOTE — Assessment & Plan Note (Signed)
Continue using over-the-counter analgesics as needed and staying hydrated.

## 2021-05-29 NOTE — Assessment & Plan Note (Signed)
Offer patient cough medication.  States he has leftover cough syrup from his primary care provider that he can use if needed.

## 2021-05-30 ENCOUNTER — Encounter: Payer: Self-pay | Admitting: Internal Medicine

## 2021-05-30 ENCOUNTER — Encounter: Payer: Self-pay | Admitting: Nurse Practitioner

## 2021-05-30 DIAGNOSIS — U071 COVID-19: Secondary | ICD-10-CM

## 2021-05-31 DIAGNOSIS — U071 COVID-19: Secondary | ICD-10-CM | POA: Insufficient documentation

## 2021-05-31 MED ORDER — MOLNUPIRAVIR EUA 200MG CAPSULE
4.0000 | ORAL_CAPSULE | Freq: Two times a day (BID) | ORAL | 0 refills | Status: AC
Start: 2021-05-31 — End: 2021-06-05

## 2021-05-31 NOTE — Telephone Encounter (Signed)
Mr. Boakye is returning your call, please call him back at [phone]

## 2021-05-31 NOTE — Telephone Encounter (Signed)
Called and spoke with patient about covid being positive and appropriate treatment sent in.

## 2021-07-10 ENCOUNTER — Ambulatory Visit: Payer: Managed Care, Other (non HMO) | Admitting: Internal Medicine

## 2021-07-10 ENCOUNTER — Telehealth: Payer: Self-pay | Admitting: Internal Medicine

## 2021-07-10 ENCOUNTER — Other Ambulatory Visit: Payer: Self-pay

## 2021-07-10 ENCOUNTER — Encounter: Payer: Self-pay | Admitting: Internal Medicine

## 2021-07-10 VITALS — BP 130/70 | HR 80 | Temp 97.6°F | Ht 70.0 in | Wt 191.8 lb

## 2021-07-10 DIAGNOSIS — W540XXA Bitten by dog, initial encounter: Secondary | ICD-10-CM | POA: Diagnosis not present

## 2021-07-10 DIAGNOSIS — Z23 Encounter for immunization: Secondary | ICD-10-CM | POA: Diagnosis not present

## 2021-07-10 DIAGNOSIS — T148XXA Other injury of unspecified body region, initial encounter: Secondary | ICD-10-CM | POA: Insufficient documentation

## 2021-07-10 DIAGNOSIS — F411 Generalized anxiety disorder: Secondary | ICD-10-CM

## 2021-07-10 DIAGNOSIS — F430 Acute stress reaction: Secondary | ICD-10-CM | POA: Diagnosis not present

## 2021-07-10 MED ORDER — AMOXICILLIN-POT CLAVULANATE 875-125 MG PO TABS: 1.0000 | ORAL_TABLET | Freq: Two times a day (BID) | ORAL | 0 refills | Status: DC

## 2021-07-10 MED ORDER — ALPRAZOLAM 0.25 MG PO TABS: 0.2500 mg | ORAL_TABLET | Freq: Two times a day (BID) | ORAL | 0 refills | Status: DC | PRN

## 2021-07-10 NOTE — Patient Instructions (Signed)
I'm praying for Seth Doyle that you all receive GOOD NEWS  [facility]  Start the augmentin if you develop any redness or increase in pain

## 2021-07-10 NOTE — Progress Notes (Signed)
Subjective:  Patient ID: Seth Doyle, male    DOB: 1956/04/28  Age: 66 y.o. MRN: [id number]  CC: The primary encounter diagnosis was Dog bite, initial encounter. Diagnoses of Puncture wound and Anxiety as acute reaction to exceptional stress were also pertinent to this visit.   This visit occurred during the SARS-CoV-2 public health emergency.  Safety protocols were in place, including screening questions prior to the visit, additional usage of staff PPE, and extensive cleaning of exam room while observing appropriate contact time as indicated for disinfecting solutions.    HPI Seth Doyle presents for evaluation of dog bite.   Chief Complaint  Patient presents with   Acute Visit    Dog bite on right pointer finger   Last  Td vaccination 2014    Seth Doyle was bitten yesterday by his eloved  family dog , Tank , a  69 yr old fully vaccinated 180 lb mastiff  after Tank had a witnessed tonic clonic  seizure at home.  The bite  was superficial and occurred when the dog was post ictal and involved  Seth Doyle's his right index finger.  T(He had been waving his hands in fron of Tank's face).  Tank recovered after a few moments (per review of patient's recorded video) and resumed his affectionate , responsive demeanor directly after the episode.   Seth Doyle reports the the wound bled. His wife is an Therapist, sports and cleaned it properlyu.  He has not bruising of the finger,  is able to move all digits without pain or loss of ROM.    He has had no fevers, bruising or discharge from the puncture wound.     He is distraught and anxious today due to the likelihood that his dog has a brain tumor and has not been able to sleep .    Outpatient Medications Prior to Visit  Medication Sig Dispense Refill   Ascorbic Acid (VITAMIN C) 100 MG tablet Take 100 mg by mouth daily.     cholecalciferol (VITAMIN D3) 25 MCG (1000 UNIT) tablet Take 1,000 Units by mouth daily.     clobetasol (TEMOVATE) 0.05 % external solution  Apply topically as needed. MIX WITH A JAR OF CERAVE CREAM AND  APPLY TO AA 1-2 TIMES A DAY AS NEEDED FOR ITCHY RASH. 50 mL 1   hydrOXYzine (ATARAX/VISTARIL) 25 MG tablet Take 1 tablet (25 mg total) by mouth as needed. TK 1 T PO HS PRF ITCHING. MAY MAKE DROWSY 30 tablet 1   ibuprofen (ADVIL) 400 MG tablet Take 400 mg by mouth every 6 (six) hours as needed.     Multiple Vitamin (MULTIVITAMIN) tablet Take 1 tablet by mouth daily.     ondansetron (ZOFRAN) 4 MG tablet Take 1 tablet (4 mg total) by mouth every 8 (eight) hours as needed for nausea or vomiting. 20 tablet 0   saw palmetto 160 MG capsule Take 160 mg by mouth 2 (two) times daily.     zinc gluconate 50 MG tablet Take 50 mg by mouth daily.     zolpidem (AMBIEN) 10 MG tablet TAKES 1/2 TABLET BY MOUTH EVERY NIGHT AT BEDTIME AS NEEDED FOR SLEEP 30 tablet 4   fluticasone (FLONASE) 50 MCG/ACT nasal spray Place 2 sprays into both nostrils daily. (Patient not taking: Reported on 07/10/2021) 16 g 0   No facility-administered medications prior to visit.    Review of Systems;  Patient denies headache, fevers, malaise, unintentional weight loss, skin rash, eye pain, sinus congestion and  sinus pain, sore throat, dysphagia,  hemoptysis , cough, dyspnea, wheezing, chest pain, palpitations, orthopnea, edema, abdominal pain, nausea, melena, diarrhea, constipation, flank pain, dysuria, hematuria, urinary  Frequency, nocturia, numbness, tingling, seizures,  Focal weakness, Loss of consciousness,  Tremor, insomnia, depression, anxiety, and suicidal ideation.      Objective:  BP 130/70 (BP Location: Left Arm, Patient Position: Sitting, Cuff Size: Large)    Pulse 80    Temp 97.6 F (36.4 C) (Oral)    Ht 5\' 10"  (1.778 m)    Wt 191 lb 12.8 oz (87 kg)    SpO2 95%    BMI 27.52 kg/m   BP Readings from Last 3 Encounters:  07/10/21 130/70  05/29/21 140/84  02/03/21 (!) 150/84    Wt Readings from Last 3 Encounters:  07/10/21 191 lb 12.8 oz (87 kg)  05/29/21  203 lb (92.1 kg)  03/02/21 200 lb (90.7 kg)    General appearance: alert, cooperative and appears stated age Back: symmetric, no curvature. ROM normal. No CVA tenderness. Lungs: clear to auscultation bilaterally Heart: regular rate and rhythm, S1, S2 normal, no murmur, click, rub or gallop Pulses: 2+ and symmetric radial pulses.  Cap refill < 2 sec.    Skin: Skin color, texture, turgor normal.  2 small punctate wounds at base of index finger on palmar side of hand  Lymph nodes: Cervical, supraclavicular, and axillary nodes normal.  Lab Results  Component Value Date   HGBA1C 5.7 09/20/2017   HGBA1C 5.7 01/06/2016    Lab Results  Component Value Date   CREATININE 1.22 07/22/2020   CREATININE 1.38 07/11/2020   CREATININE 1.22 09/25/2019    Lab Results  Component Value Date   WBC 14.8 (H) 12/16/2020   HGB 15.4 12/16/2020   HCT 43.3 12/16/2020   PLT 165 12/16/2020   GLUCOSE 102 (H) 07/22/2020   CHOL CANCELED 01/17/2018   TRIG CANCELED 01/17/2018   HDL CANCELED 01/17/2018   LDLDIRECT 69.0 02/28/2016   LDLCALC 90 12/07/2016   ALT 19 07/11/2020   AST 21 07/11/2020   NA 138 07/22/2020   K 4.1 07/22/2020   CL 104 07/22/2020   CREATININE 1.22 07/22/2020   BUN 27 (H) 07/22/2020   CO2 27 07/22/2020   TSH 1.239 10/05/2019   PSA 0.93 07/11/2020   HGBA1C 5.7 09/20/2017   MICROALBUR 0.7 02/28/2016    CT Angio Chest/Abd/Pel for Dissection W and/or Wo Contrast  Result Date: 08/18/2019 CLINICAL DATA:  Right lower quadrant abdominal pain EXAM: CT ANGIOGRAPHY CHEST, ABDOMEN AND PELVIS TECHNIQUE: Multidetector CT imaging through the chest, abdomen and pelvis was performed using the standard protocol during bolus administration of intravenous contrast. Multiplanar reconstructed images and MIPs were obtained and reviewed to evaluate the vascular anatomy. CONTRAST:  110mL OMNIPAQUE IOHEXOL 350 MG/ML SOLN COMPARISON:  06/12/2019 FINDINGS: CTA CHEST FINDINGS Cardiovascular: This is a  technically adequate evaluation of the thoracic aorta. No aneurysm or dissection. The heart is unremarkable without pericardial effusion. While not optimized for opacification of the pulmonary vasculature, there is sufficient contrast enhancement to exclude pulmonary emboli. Mediastinum/Nodes: No enlarged mediastinal, hilar, or axillary lymph nodes. Thyroid gland, trachea, and esophagus demonstrate no significant findings. Lungs/Pleura: No airspace disease, effusion, or pneumothorax. Central airways are widely patent. Musculoskeletal: No acute displaced fracture. Reconstructed images demonstrate no additional findings. Review of the MIP images confirms the above findings. CTA ABDOMEN AND PELVIS FINDINGS VASCULAR Aorta: Normal caliber aorta without aneurysm, dissection, vasculitis or significant stenosis. Celiac: Patent without evidence of aneurysm,  dissection, vasculitis or significant stenosis. SMA: Patent without evidence of aneurysm, dissection, vasculitis or significant stenosis. Renals: There are 2 renal arteries bilaterally, which are widely patent without significant atherosclerosis or stenosis. IMA: Patent without evidence of aneurysm, dissection, vasculitis or significant stenosis. Inflow: Patent without evidence of aneurysm, dissection, vasculitis or significant stenosis. Veins: No obvious venous abnormality within the limitations of this arterial phase study. Review of the MIP images confirms the above findings. NON-VASCULAR Hepatobiliary: No focal liver abnormality is seen. No gallstones, gallbladder wall thickening, or biliary dilatation. Pancreas: Unremarkable. No pancreatic ductal dilatation or surrounding inflammatory changes. Spleen: Normal in size without focal abnormality. Heterogeneous enhancement related to arterial phase of exam. Adrenals/Urinary Tract: There is a punctate 2 mm obstructing mid right ureteral calculus reference image 134, with right-sided obstructive uropathy as result. Left  kidney is unremarkable. Stable right adrenal adenoma. Left adrenal is normal. Bladder is unremarkable. Stomach/Bowel: Scattered colonic diverticulosis without diverticulitis. Normal appendix. No bowel obstruction or ileus. Postsurgical changes from prior partial sigmoid colon resection and reanastomosis. Lymphatic: No pathologic adenopathy within the abdomen or pelvis. Reproductive: Prostate is unremarkable. Other: No abdominal wall hernia or abnormality. No abdominopelvic ascites. Musculoskeletal: No acute or destructive bony lesions. Reconstructed images demonstrate no additional findings. Review of the MIP images confirms the above findings. IMPRESSION: 1. No evidence of aneurysm or dissection within the thoracoabdominal aorta. 2. Punctate less than 2 mm obstructing calculus mid right ureter, with mild right-sided obstructive uropathy. 3. Diverticulosis without diverticulitis. 4. Otherwise no acute intrathoracic, intra-abdominal, or intrapelvic process. Electronically Signed   By: Randa Ngo M.D.   On: 08/18/2019 15:15    Assessment & Plan:   Problem List Items Addressed This Visit     Anxiety as acute reaction to exceptional stress    He is very concerned about his beloved dog's health and the potential diagnosis of brain tumor .  Alprazolam rx has been  given for prn use       Relevant Medications   ALPRAZolam (XANAX) 0.25 MG tablet   Dog bite - Primary    His dog is fully vaccinated.  Td vaccine given as he is due in > 1 year.       Relevant Orders   Td : Tetanus/diphtheria >7yo Preservative  free (Completed)   Puncture wound    Right index finger,  Caused by dogbite .  Currently there are no signs of infection at > 24 hours.  rx for augemtin sent to pharmacy for future use if cellulitis develops        I spent 30 minutes dedicated to the care of this patient on the date of this encounter to include pre-visit review of patient's medical history,  most recent imaging studies,  Face-to-face time with the patient , and post visit ordering of testing and therapeutics.    Follow-up: No follow-ups on file.   Crecencio Mc, MD

## 2021-07-10 NOTE — Telephone Encounter (Signed)
Patient scheduled today at 1:15p.

## 2021-07-10 NOTE — Telephone Encounter (Signed)
Spoke with pt and he stated that his dog was having a seizure when the pt got bit by him. Pt stated that he is not worried about the dog bite he is just more shuck up about the seizure because he has never seen one before. Pt's last TDAP vaccine was in 2014. Pt stated that there is just two holes in the skin but didn't go all the way to the bone. No appts available.

## 2021-07-10 NOTE — Telephone Encounter (Signed)
Pt called in stating that his dog bit him while he was having a seizure. Pt has two small cuts on right hand.

## 2021-07-10 NOTE — Assessment & Plan Note (Addendum)
Right index finger,  Caused by dogbite .  Currently there are no signs of infection at > 24 hours.  rx for augemtin sent to pharmacy for future use if cellulitis develops

## 2021-07-11 DIAGNOSIS — F411 Generalized anxiety disorder: Secondary | ICD-10-CM | POA: Insufficient documentation

## 2021-07-11 DIAGNOSIS — F430 Acute stress reaction: Secondary | ICD-10-CM | POA: Insufficient documentation

## 2021-07-11 DIAGNOSIS — W540XXA Bitten by dog, initial encounter: Secondary | ICD-10-CM | POA: Insufficient documentation

## 2021-07-11 NOTE — Assessment & Plan Note (Signed)
He is very concerned about his beloved dog's health and the potential diagnosis of brain tumor .  Alprazolam rx has been  given for prn use

## 2021-07-11 NOTE — Assessment & Plan Note (Signed)
His dog is fully vaccinated.  Td vaccine given as he is due in > 1 year.

## 2021-09-05 ENCOUNTER — Ambulatory Visit: Payer: Managed Care, Other (non HMO) | Admitting: Dermatology

## 2021-09-13 ENCOUNTER — Ambulatory Visit: Payer: Managed Care, Other (non HMO) | Admitting: Dermatology

## 2021-09-13 DIAGNOSIS — Z1283 Encounter for screening for malignant neoplasm of skin: Secondary | ICD-10-CM | POA: Diagnosis not present

## 2021-09-13 DIAGNOSIS — D489 Neoplasm of uncertain behavior, unspecified: Secondary | ICD-10-CM

## 2021-09-13 DIAGNOSIS — D0339 Melanoma in situ of other parts of face: Secondary | ICD-10-CM | POA: Diagnosis not present

## 2021-09-13 DIAGNOSIS — L299 Pruritus, unspecified: Secondary | ICD-10-CM

## 2021-09-13 DIAGNOSIS — L570 Actinic keratosis: Secondary | ICD-10-CM | POA: Diagnosis not present

## 2021-09-13 DIAGNOSIS — L853 Xerosis cutis: Secondary | ICD-10-CM | POA: Diagnosis not present

## 2021-09-13 DIAGNOSIS — L820 Inflamed seborrheic keratosis: Secondary | ICD-10-CM

## 2021-09-13 DIAGNOSIS — D039 Melanoma in situ, unspecified: Secondary | ICD-10-CM

## 2021-09-13 DIAGNOSIS — L814 Other melanin hyperpigmentation: Secondary | ICD-10-CM

## 2021-09-13 DIAGNOSIS — L821 Other seborrheic keratosis: Secondary | ICD-10-CM

## 2021-09-13 DIAGNOSIS — L578 Other skin changes due to chronic exposure to nonionizing radiation: Secondary | ICD-10-CM

## 2021-09-13 DIAGNOSIS — D229 Melanocytic nevi, unspecified: Secondary | ICD-10-CM

## 2021-09-13 DIAGNOSIS — D1800 Hemangioma unspecified site: Secondary | ICD-10-CM

## 2021-09-13 HISTORY — DX: Melanoma in situ, unspecified: D03.9

## 2021-09-13 MED ORDER — CLOBETASOL PROPIONATE 0.05 % EX SOLN
CUTANEOUS | 1 refills | Status: DC | PRN
Start: 2021-09-13 — End: 2022-03-21

## 2021-09-13 MED ORDER — HYDROXYZINE HCL 25 MG PO TABS: 25.0000 mg | ORAL_TABLET | ORAL | 1 refills | Status: DC | PRN

## 2021-09-13 MED ORDER — FLUOROURACIL 5 % EX CREA: TOPICAL_CREAM | Freq: Two times a day (BID) | CUTANEOUS | 1 refills | Status: DC

## 2021-09-13 NOTE — Patient Instructions (Addendum)
Start Cream after trip and after areas have healed at scalp.  ? ?Start 5-fluorouracil/calcipotriene cream twice a day for 7 - 14 days to affected areas including scalp. Prescription sent to [facility] Patient provided with contact information for pharmacy and advised the pharmacy will mail the prescription to their home. Patient provided with handout reviewing treatment course and side effects and advised to call or message [place] on MyChart with any concerns. ? ? ? ? ?5-Fluorouracil/Calcipotriene Patient Education  ? ?Actinic keratoses are the dry, red scaly spots on the skin caused by sun damage. A portion of these spots can turn into skin cancer with time, and treating them can help prevent development of skin cancer.  ? ?Treatment of these spots requires removal of the defective skin cells. There are various ways to remove actinic keratoses, including freezing with liquid nitrogen, treatment with creams, or treatment with a blue light procedure in the office.  ? ?5-fluorouracil cream is a topical cream used to treat actinic keratoses. It works by interfering with the growth of abnormal fast-growing skin cells, such as actinic keratoses. These cells peel off and are replaced by healthy ones.  ? ?5-fluorouracil/calcipotriene is a combination of the 5-fluorouracil cream with a vitamin D analog cream called calcipotriene. The calcipotriene alone does not treat actinic keratoses. However, when it is combined with 5-fluorouracil, it helps the 5-fluorouracil treat the actinic keratoses much faster so that the same results can be achieved with a much shorter treatment time. ? ?INSTRUCTIONS FOR 5-FLUOROURACIL/CALCIPOTRIENE CREAM:  ? ?5-fluorouracil/calcipotriene cream typically only needs to be used for 4-7 days. A thin layer should be applied twice a day to the treatment areas recommended by your physician.  ? ?If your physician prescribed you separate tubes of 5-fluourouracil and calcipotriene, apply a thin layer  of 5-fluorouracil followed by a thin layer of calcipotriene.  ? ?Avoid contact with your eyes, nostrils, and mouth. Do not use 5-fluorouracil/calcipotriene cream on infected or open wounds.  ? ?You will develop redness, irritation and some crusting at areas where you have pre-cancer damage/actinic keratoses. IF YOU DEVELOP PAIN, BLEEDING, OR SIGNIFICANT CRUSTING, STOP THE TREATMENT EARLY - you have already gotten a good response and the actinic keratoses should clear up well. ? ?Wash your hands after applying 5-fluorouracil 5% cream on your skin.  ? ?A moisturizer or sunscreen with a minimum SPF 30 should be applied each morning.  ? ?Once you have finished the treatment, you can apply a thin layer of Vaseline twice a day to irritated areas to soothe and calm the areas more quickly. If you experience significant discomfort, contact your physician. ? ?For some patients it is necessary to repeat the treatment for best results. ? ?SIDE EFFECTS: When using 5-fluorouracil/calcipotriene cream, you may have mild irritation, such as redness, dryness, swelling, or a mild burning sensation. This usually resolves within 2 weeks. The more actinic keratoses you have, the more redness and inflammation you can expect during treatment. Eye irritation has been reported rarely. If this occurs, please let us know.  ?If you have any trouble using this cream, please call the office. If you have any other questions about this information, please do not hesitate to ask me before you leave the office. ? ?Biopsy Wound Care Instructions ? ?Leave the original bandage on for 24 hours if possible.  If the bandage becomes soaked or soiled before that time, it is OK to remove it and examine the wound.  A small amount of post-operative bleeding is normal.  If excessive bleeding occurs, remove the bandage, place gauze over the site and apply continuous pressure (no peeking) over the area for 30 minutes. If this does not work, please call our clinic  as soon as possible or page your doctor if it is after hours.  ? ?Once a day, cleanse the wound with soap and water. It is fine to shower. If a thick crust develops you may use a Q-tip dipped into dilute hydrogen peroxide (mix 1:1 with water) to dissolve it.  Hydrogen peroxide can slow the healing process, so use it only as needed.   ? ?After washing, apply petroleum jelly (Vaseline) or an antibiotic ointment if your doctor prescribed one for you, followed by a bandage.   ? ?For best healing, the wound should be covered with a layer of ointment at all times. If you are not able to keep the area covered with a bandage to hold the ointment in place, this may mean re-applying the ointment several times a day.  Continue this wound care until the wound has healed and is no longer open.  ? ?Itching and mild discomfort is normal during the healing process. However, if you develop pain or severe itching, please call our office.  ? ?If you have any discomfort, you can take Tylenol (acetaminophen) or ibuprofen as directed on the bottle. (Please do not take these if you have an allergy to them or cannot take them for another reason). ? ?Some redness, tenderness and white or yellow material in the wound is normal healing.  If the area becomes very sore and red, or develops a thick yellow-green material (pus), it may be infected; please notify us.   ? ?If you have stitches, return to clinic as directed to have the stitches removed. You will continue wound care for 2-3 days after the stitches are removed.  ? ?Wound healing continues for up to one year following surgery. It is not unusual to experience pain in the scar from time to time during the interval.  If the pain becomes severe or the scar thickens, you should notify the office.   ? ?A slight amount of redness in a scar is expected for the first six months.  After six months, the redness will fade and the scar will soften and fade.  The color difference becomes less  noticeable with time.  If there are any problems, return for a post-op surgery check at your earliest convenience. ? ?To improve the appearance of the scar, you can use silicone scar gel, cream, or sheets (such as Mederma or Serica) every night for up to one year. These are available over the counter (without a prescription). ? ?Please call our office at [phone] for any questions or concerns. ? ? ? ? ?Actinic keratoses are precancerous spots that appear secondary to cumulative UV radiation exposure/sun exposure over time. They are chronic with expected duration over 1 year. A portion of actinic keratoses will progress to squamous cell carcinoma of the skin. It is not possible to reliably predict which spots will progress to skin cancer and so treatment is recommended to prevent development of skin cancer. ? ?Recommend daily broad spectrum sunscreen SPF 30+ to sun-exposed areas, reapply every 2 hours as needed.  ?Recommend staying in the shade or wearing long sleeves, sun glasses (UVA+UVB protection) and wide brim hats (4-inch brim around the entire circumference of the hat). ?Call for new or changing lesions.  ? ? ?Cryotherapy Aftercare ? ?Wash gently with soap and water  everyday.   ?Apply Vaseline and Band-Aid daily until healed.  ? ? ?Seborrheic Keratosis ? ?What causes seborrheic keratoses? ?Seborrheic keratoses are harmless, common skin growths that first appear during adult life.  As time goes by, more growths appear.  Some people may develop a large number of them.  Seborrheic keratoses appear on both covered and uncovered body parts.  They are not caused by sunlight.  The tendency to develop seborrheic keratoses can be inherited.  They vary in color from skin-colored to gray, brown, or even black.  They can be either smooth or have a rough, warty surface.   ?Seborrheic keratoses are superficial and look as if they were stuck on the skin.  Under the microscope this type of keratosis looks like layers  upon layers of skin.  That is why at times the top layer may seem to fall off, but the rest of the growth remains and re-grows.   ? ?Treatment ?Seborrheic keratoses do not need to be treated, but can easily be r

## 2021-09-13 NOTE — Progress Notes (Signed)
? ?Follow-Up Visit ?  ?Subjective  ?Seth Doyle is a 66 y.o. male who presents for the following: Annual Exam (1 year tbse. Hx of aks, seb derm , and xerosis. Using clobetasol and cerve mix. ). Pt's itching is controlled with qod use of mixture. ? ? ?The patient presents for Upper Body Skin Exam (UBSE) for skin cancer screening and mole check.  The patient has spots, moles and lesions to be evaluated, some may be new or changing and the patient has concerns that these could be cancer. ? ?The following portions of the chart were reviewed this encounter and updated as appropriate:   ?  ? ?Review of Systems: No other skin or systemic complaints except as noted in HPI or Assessment and Plan. ? ? ?Objective  ?Well appearing patient in no apparent distress; mood and affect are within normal limits. ? ?A full examination was performed including scalp, head, eyes, ears, nose, lips, neck, chest, axillae, abdomen, back, buttocks, bilateral upper extremities, bilateral lower extremities, hands, feet, fingers, toes, fingernails, and toenails. All findings within normal limits unless otherwise noted below. ? ?scalp x 13, temple x 2 (15) ?Erythematous thin papules/macules with gritty scale.  ? ?chest and right shoulder ?Few small pink papules at mid back , erythema at chest , small pink papules at arms  ? ?left arm x 1, right lower leg x 1 (2) ?Erythematous stuck-on, waxy papule or plaque ? ?right upper forehead ?10 mm brown slightly irregular pigmented patch, slightly waxy  ? ? ? ? ? ? ? ?Assessment & Plan  ?Actinic keratosis (15) ?scalp x 13, temple x 2 ? ? ? ? ?Actinic keratoses are precancerous spots that appear secondary to cumulative UV radiation exposure/sun exposure over time. They are chronic with expected duration over 1 year. A portion of actinic keratoses will progress to squamous cell carcinoma of the skin. It is not possible to reliably predict which spots will progress to skin cancer and so treatment is  recommended to prevent development of skin cancer. ? ?Recommend daily broad spectrum sunscreen SPF 30+ to sun-exposed areas, reapply every 2 hours as needed.  ?Recommend staying in the shade or wearing long sleeves, sun glasses (UVA+UVB protection) and wide brim hats (4-inch brim around the entire circumference of the hat). ?Call for new or changing lesions. ? ?Destruction of lesion - scalp x 13, temple x 2 ? ?Destruction method: cryotherapy   ?Informed consent: discussed and consent obtained   ?Lesion destroyed using liquid nitrogen: Yes   ?Region frozen until ice ball extended beyond lesion: Yes   ?Outcome: patient tolerated procedure well with no complications   ?Post-procedure details: wound care instructions given   ?Additional details:  Prior to procedure, discussed risks of blister formation, small wound, skin dyspigmentation, or rare scar following cryotherapy. Recommend Vaseline ointment to treated areas while healing. ? ? ?Related Medications ?fluorouracil (EFUDEX) 5 % cream ?Apply topically 2 (two) times daily. Apply to scalp 7- 14 days ? 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 Currently well-controlled.  ? ?Recommend mild soap and moisturizing cream 1-2 times daily.  Can try CeraVe anti itch cream. Gentle skin care handout provided.  ? ?Continue Clobetasol solution mixed in CeraVe Cream Apply qd/bid Aas prn itch dsp 3m 1Rf. Avoid face, groin, axilla. Caution skin atrophy with long-term use. Pt is using once every other day ?  ?Continue hydroxyzine '25mg'$  take 1 po qhs prn itch dsp #  30 1Rf. ?  ?  ?Topical steroids (such as triamcinolone, fluocinolone, fluocinonide, mometasone, clobetasol, halobetasol, betamethasone, hydrocortisone) can cause thinning and lightening of the skin if they are used for too long in the same area. Your physician has selected the right strength medicine for your problem and area affected on the  body. Please use your medication only as directed by your physician to prevent side effects.  ?  ? ?clobetasol (TEMOVATE) 0.05 % external solution - chest and right shoulder ?Apply topically as needed. MIX WITH A JAR OF CERAVE CREAM AND  APPLY TO AA 1-2 TIMES A DAY AS NEEDED FOR ITCHY RASH. ? ?hydrOXYzine (ATARAX) 25 MG tablet - chest and right shoulder ?Take 1 tablet (25 mg total) by mouth as needed. TK 1 T PO HS PRF ITCHING. MAY MAKE DROWSY ? ?Inflamed seborrheic keratosis (2) ?left arm x 1, right lower leg x 1 ? ?Destruction of lesion - left arm x 1, right lower leg x 1 ? ?Destruction method: cryotherapy   ?Informed consent: discussed and consent obtained   ?Lesion destroyed using liquid nitrogen: Yes   ?Region frozen until ice ball extended beyond lesion: Yes   ?Outcome: patient tolerated procedure well with no complications   ?Post-procedure details: wound care instructions given   ?Additional details:  Prior to procedure, discussed risks of blister formation, small wound, skin dyspigmentation, or rare scar following cryotherapy. Recommend Vaseline ointment to treated areas while healing. ? ? ?Neoplasm of uncertain behavior ?right upper forehead ? ?Skin / nail biopsy ?Type of biopsy: tangential   ?Informed consent: discussed and consent obtained   ?Patient was prepped and draped in usual sterile fashion: Area prepped with alcohol. ?Anesthesia: the lesion was anesthetized in a standard fashion   ?Anesthetic:  1% lidocaine w/ epinephrine 1-100,000 buffered w/ 8.4% NaHCO3 ?Instrument used: flexible razor blade   ?Hemostasis achieved with: pressure, aluminum chloride and electrodesiccation   ?Outcome: patient tolerated procedure well   ?Post-procedure details: wound care instructions given   ?Post-procedure details comment:  Ointment and small bandage applied ? ?Specimen 1 - Surgical pathology ?Differential Diagnosis: R/o seborrheic keratosis vs lentigo vs atypia  ? ?Check Margins: No ? ?R/o seborrheic keratosis vs  lentigo vs atypia  ? ?Lentigines ?- Scattered tan macules ?- Due to sun exposure ?- Benign-appearing, observe ?- Recommend daily broad spectrum sunscreen SPF 30+ to sun-exposed areas, reapply every 2 hours as needed. ?- Call for any changes ? ?Seborrheic Keratoses ?- Stuck-on, waxy, tan-brown papules and/or plaques  ?- Benign-appearing ?- Discussed benign etiology and prognosis. ?- Observe ?- Call for any changes ? ?Melanocytic Nevi ?- Tan-brown and/or pink-flesh-colored symmetric macules and papules ?- Benign appearing on exam today ?- Observation ?- Call clinic for new or changing moles ?- Recommend daily use of broad spectrum spf 30+ sunscreen to sun-exposed areas.  ? ?Hemangiomas ?- Red papules ?- Discussed benign nature ?- Observe ?- Call for any changes ? ?Actinic Damage - Severe, confluent actinic changes with pre-cancerous actinic keratoses  ?- Severe, chronic, not at goal, secondary to cumulative UV radiation exposure over time ?- diffuse scaly erythematous macules and papules with underlying dyspigmentation ?- Discussed Prescription "Field Treatment" for Severe, Chronic Confluent Actinic Changes with Pre-Cancerous Actinic Keratoses ?Field treatment involves treatment of an entire area of skin that has confluent Actinic Changes (Sun/ Ultraviolet light damage) and PreCancerous Actinic Keratoses by method of PhotoDynamic Therapy (PDT) and/or prescription Topical Chemotherapy agents such as 5-fluorouracil, 5-fluorouracil/calcipotriene, and/or imiquimod.  The purpose is to decrease the number  of clinically evident and subclinical PreCancerous lesions to prevent progression to development of skin cancer by chemically destroying early precancer changes that may or may not be visible.  It has been shown to reduce the risk of developing skin cancer in the treated area. As a result of treatment, redness, scaling, crusting, and open sores may occur during treatment course. One or more than one of these methods may be  used and may have to be used several times to control, suppress and eliminate the PreCancerous changes. Discussed treatment course, expected reaction, and possible side effects. ?- Recommend daily broad

## 2021-09-25 ENCOUNTER — Other Ambulatory Visit: Payer: Self-pay

## 2021-09-25 DIAGNOSIS — D0330 Melanoma in situ of unspecified part of face: Secondary | ICD-10-CM

## 2021-09-25 NOTE — Progress Notes (Signed)
ch

## 2021-11-09 ENCOUNTER — Telehealth: Payer: Self-pay

## 2021-11-09 NOTE — Telephone Encounter (Signed)
Specimen tracking and history updated from [facility] progress notes/photo. Signed off on by Dr. Nicole Kindred. aw

## 2021-12-22 ENCOUNTER — Other Ambulatory Visit: Payer: Self-pay | Admitting: Internal Medicine

## 2021-12-25 ENCOUNTER — Telehealth: Payer: Self-pay

## 2021-12-25 NOTE — Telephone Encounter (Signed)
Called pt discussed start the 5FU/VitD twice daily to scalp for 10 days.   5-fluorouracil/calcipotriene cream is is a type of field treatment used to treat precancers, thin skin cancers, and areas of sun damage. Expected reaction includes irritation and mild inflammation potentially progressing to more severe inflammation including redness, scaling, crusting and open sores/erosions.  If too much irritation occurs, ensure application of only a thin layer and decrease frequency of use to achieve a tolerable level of inflammation. Recommend applying Vaseline ointment to open sores as needed.  Minimize sun exposure while under treatment. Recommend daily broad spectrum sunscreen SPF 30+ to sun-exposed areas, reapply every 2 hours as needed.

## 2021-12-25 NOTE — Telephone Encounter (Signed)
Pt had Mohs surgery for a Melanoma on the forehead- in June by Dr Manley Mason, pt report area is completely healed he would like to know if he can start 5FU on the scalp as he was prescribed at his last office visit here in April

## 2022-01-04 DIAGNOSIS — M1612 Unilateral primary osteoarthritis, left hip: Secondary | ICD-10-CM | POA: Diagnosis not present

## 2022-01-04 DIAGNOSIS — M25552 Pain in left hip: Secondary | ICD-10-CM | POA: Diagnosis not present

## 2022-02-07 DIAGNOSIS — M25552 Pain in left hip: Secondary | ICD-10-CM | POA: Diagnosis not present

## 2022-02-23 ENCOUNTER — Telehealth (INDEPENDENT_AMBULATORY_CARE_PROVIDER_SITE_OTHER): Payer: BC Managed Care – PPO | Admitting: Family Medicine

## 2022-02-23 ENCOUNTER — Telehealth: Payer: Self-pay | Admitting: Internal Medicine

## 2022-02-23 ENCOUNTER — Encounter: Payer: Self-pay | Admitting: Family Medicine

## 2022-02-23 VITALS — BP 121/70 | Ht 70.0 in | Wt 183.0 lb

## 2022-02-23 DIAGNOSIS — H1031 Unspecified acute conjunctivitis, right eye: Secondary | ICD-10-CM | POA: Diagnosis not present

## 2022-02-23 MED ORDER — NEOMYCIN-POLYMYXIN-DEXAMETH 3.5-10000-0.1 OP SUSP: 2.0000 [drp] | Freq: Four times a day (QID) | OPHTHALMIC | 0 refills | Status: DC

## 2022-02-23 MED ORDER — NEOMYCIN-POLYMYXIN-HC 3.5-10000-1 OP SUSP: 2.0000 [drp] | Freq: Four times a day (QID) | OPHTHALMIC | 0 refills | Status: DC

## 2022-02-23 NOTE — Assessment & Plan Note (Signed)
Acute Most likely viral conjunctivitis.  Reviewed symptomatic care.  No sign of periorbital cellulitis with full range of motion of the eye. Will provide topical drops for comfort and prevention of bacterial superinfection.

## 2022-02-23 NOTE — Telephone Encounter (Signed)
error 

## 2022-02-23 NOTE — Progress Notes (Signed)
VIRTUAL VISIT A virtual visit is felt to be most appropriate for this patient at this time.   I connected with the patient on 02/23/22 at 12:00 PM EDT by virtual telehealth platform and verified that I am speaking with the correct person using two identifiers.   I discussed the limitations, risks, security and privacy concerns of performing an evaluation and management service by  virtual telehealth platform and the availability of in person appointments. I also discussed with the patient that there may be a patient responsible charge related to this service. The patient expressed understanding and agreed to proceed.  Patient location: Home Provider Location:  [place] Participants: Eliezer Lofts and Fransico Michael   Chief Complaint  Patient presents with   Conjunctivitis    Right Eye    History of Present Illness: 66 year old male patient of Dr. Lupita Dawn presents with new onset redness of the eye.   He reports  new onset  redness and discharge in right eye starting this AM.  NO itchiness, no eye pain.   No fever, no cough, no sob.   Has not tried any treatment.   He thinks step daughter had it last week.  COVID 19 screen No recent travel or known exposure to COVID19 The patient denies respiratory symptoms of COVID 19 at this time.  The importance of social distancing was discussed today.   Review of Systems  Constitutional:  Negative for chills and fever.  HENT:  Negative for congestion and ear pain.   Eyes:  Positive for discharge and redness. Negative for pain.  Respiratory:  Negative for cough and shortness of breath.   Cardiovascular:  Negative for chest pain, palpitations and leg swelling.  Gastrointestinal:  Negative for abdominal pain, blood in stool, constipation, diarrhea, nausea and vomiting.  Genitourinary:  Negative for dysuria.  Musculoskeletal:  Negative for falls and myalgias.  Skin:  Negative for rash.  Neurological:  Negative for dizziness.   Psychiatric/Behavioral:  Negative for depression. The patient is not nervous/anxious.       Past Medical History:  Diagnosis Date   Actinic keratosis    Chronic insomnia 03/26/2014   Managed with generic ambien.      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 no longer on medicines.   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  last one approx 6 mos ago   Wears contact lenses     reports that he has never smoked. He has never used smokeless tobacco. He reports current alcohol use. He reports that he does not use drugs.   Current Outpatient Medications:    Ascorbic Acid (VITAMIN C) 100 MG tablet, Take 100 mg by mouth daily., Disp: , Rfl:    clobetasol (TEMOVATE) 0.05 % external solution, Apply topically as needed. MIX WITH A JAR OF CERAVE CREAM AND  APPLY TO AA 1-2 TIMES A DAY AS NEEDED FOR ITCHY RASH., Disp: 50 mL, Rfl: 1   fluorouracil (EFUDEX) 5 % cream, Apply topically 2 (two) times daily. Apply to scalp 7- 14 days, Disp: 15  g, Rfl: 1   hydrOXYzine (ATARAX) 25 MG tablet, Take 1 tablet (25 mg total) by mouth as needed. TK 1 T PO HS PRF ITCHING. MAY MAKE DROWSY, Disp: 30 tablet, Rfl: 1   Multiple Vitamin (MULTIVITAMIN) tablet, Take 1 tablet by mouth daily., Disp: , Rfl:    ondansetron (ZOFRAN) 4 MG tablet, Take 1 tablet (4 mg total) by mouth every 8 (eight) hours as needed for nausea or vomiting., Disp: 20 tablet, Rfl: 0   zolpidem (AMBIEN) 10 MG tablet, TAKE 1/2 TABLET BY MOUTH EVERY NIGHT AT BEDTIME AS NEEDED FOR SLEEP, Disp: 30  tablet, Rfl: 2   Observations/Objective: Blood pressure 121/70, height '5\' 10"'$  (1.778 m), weight 183 lb (83 kg).    Physical Exam Constitutional:      General: The patient is not in acute distress.  EYES: Erythema right conjunctive no surrounding redness periorbitally, full extraocular muscles intact Pulmonary:     Effort: Pulmonary effort is normal. No respiratory distress.  Neurological:     Mental Status: The patient is alert and oriented to person, place, and time.  Psychiatric:        Mood and Affect: Mood normal.        Behavior: Behavior normal.   Assessment and Plan Problem List Items Addressed This Visit     Acute conjunctivitis of right eye - Primary    Acute Most likely viral conjunctivitis.  Reviewed symptomatic care.  No sign of periorbital cellulitis with full range of motion of the eye. Will provide topical drops for comfort and prevention of bacterial superinfection.         I discussed the assessment and treatment plan with the patient. The patient was provided an opportunity to ask questions and all were answered. The patient agreed with the plan and demonstrated an understanding of the instructions.   The patient was advised to call back or seek an in-person evaluation if the symptoms worsen or if the condition fails to improve as anticipated.     Eliezer Lofts, MD

## 2022-02-23 NOTE — Addendum Note (Signed)
Addended by: Eliezer Lofts E on: 02/23/2022 03:45 PM   Modules accepted: Orders

## 2022-03-20 ENCOUNTER — Ambulatory Visit: Payer: Managed Care, Other (non HMO) | Admitting: Dermatology

## 2022-03-21 ENCOUNTER — Ambulatory Visit: Payer: BC Managed Care – PPO | Admitting: Dermatology

## 2022-03-21 ENCOUNTER — Encounter: Payer: Self-pay | Admitting: Dermatology

## 2022-03-21 DIAGNOSIS — L309 Dermatitis, unspecified: Secondary | ICD-10-CM

## 2022-03-21 DIAGNOSIS — L570 Actinic keratosis: Secondary | ICD-10-CM | POA: Diagnosis not present

## 2022-03-21 DIAGNOSIS — Z86006 Personal history of melanoma in-situ: Secondary | ICD-10-CM

## 2022-03-21 DIAGNOSIS — Z1283 Encounter for screening for malignant neoplasm of skin: Secondary | ICD-10-CM

## 2022-03-21 DIAGNOSIS — L578 Other skin changes due to chronic exposure to nonionizing radiation: Secondary | ICD-10-CM

## 2022-03-21 DIAGNOSIS — D229 Melanocytic nevi, unspecified: Secondary | ICD-10-CM

## 2022-03-21 DIAGNOSIS — L814 Other melanin hyperpigmentation: Secondary | ICD-10-CM

## 2022-03-21 DIAGNOSIS — L853 Xerosis cutis: Secondary | ICD-10-CM

## 2022-03-21 DIAGNOSIS — L821 Other seborrheic keratosis: Secondary | ICD-10-CM

## 2022-03-21 DIAGNOSIS — D692 Other nonthrombocytopenic purpura: Secondary | ICD-10-CM

## 2022-03-21 MED ORDER — CLOBETASOL PROPIONATE 0.05 % EX CREA: TOPICAL_CREAM | CUTANEOUS | 2 refills | Status: AC

## 2022-03-21 MED ORDER — CLOBETASOL PROPIONATE 0.05 % EX SOLN: CUTANEOUS | 1 refills | Status: DC | PRN

## 2022-03-21 MED ORDER — HYDROXYZINE HCL 25 MG PO TABS: ORAL_TABLET | ORAL | 2 refills | Status: DC

## 2022-03-21 NOTE — Patient Instructions (Addendum)
Scalp/Face: Continue Fluorouracil/Calcipotriene cream twice daily to scalp, temples, forehead up to 14 days as tolerated  Rash/Dermatitis body: Start Clobetasol cream twice daily to affected areas as needed for rash/itching. Avoid applying to face, groin, and axilla. Use as directed. Long-term use can cause thinning of the skin.  Continue Hydroxyzine '25mg'$  1 tablet at bedtime as needed for itching.  Topical steroids (such as triamcinolone, fluocinolone, fluocinonide, mometasone, clobetasol, halobetasol, betamethasone, hydrocortisone) can cause thinning and lightening of the skin if they are used for too long in the same area. Your physician has selected the right strength medicine for your problem and area affected on the body. Please use your medication only as directed by your physician to prevent side effects.   Eczema Skin Care  Buy TWO 16oz jars of CeraVe moisturizing cream  [facility], [facility], [facility] (no prescription needed)  Costs about $15 per jar   Jar #1: Use as a moisturizer as needed. Can be applied to any area of the body. Use twice daily to unaffected areas.  Jar #2: Pour one 76m bottle of clobetasol 0.05% solution into jar, mix well. Label this jar to indicate the medication has been added. Use twice daily to affected areas. Do not apply to face, groin or underarms.  Moisturizer may burn or sting initially. Try for at least 4 weeks.    Recommend daily broad spectrum sunscreen SPF 30+ to sun-exposed areas, reapply every 2 hours as needed. Call for new or changing lesions.  Staying in the shade or wearing long sleeves, sun glasses (UVA+UVB protection) and wide brim hats (4-inch brim around the entire circumference of the hat) are also recommended for sun protection.    Melanoma ABCDEs  Melanoma is the most dangerous type of skin cancer, and is the leading cause of death from skin disease.  You are more likely to develop melanoma if you: Have light-colored skin, light-colored  eyes, or red or blond hair Spend a lot of time in the sun Tan regularly, either outdoors or in a tanning bed Have had blistering sunburns, especially during childhood Have a close family member who has had a melanoma Have atypical moles or large birthmarks  Early detection of melanoma is key since treatment is typically straightforward and cure rates are extremely high if we catch it early.   The first sign of melanoma is often a change in a mole or a new dark spot.  The ABCDE system is a way of remembering the signs of melanoma.  A for asymmetry:  The two halves do not match. B for border:  The edges of the growth are irregular. C for color:  A mixture of colors are present instead of an even brown color. D for diameter:  Melanomas are usually (but not always) greater than 685m- the size of a pencil eraser. E for evolution:  The spot keeps changing in size, shape, and color.  Please check your skin once per month between visits. You can use a small mirror in front and a large mirror behind you to keep an eye on the back side or your body.   If you see any new or changing lesions before your next follow-up, please call to schedule a visit.  Please continue daily skin protection including broad spectrum sunscreen SPF 30+ to sun-exposed areas, reapplying every 2 hours as needed when you're outdoors.   Staying in the shade or wearing long sleeves, sun glasses (UVA+UVB protection) and wide brim hats (4-inch brim around the entire circumference  of the hat) are also recommended for sun protection.    Due to recent changes in healthcare laws, you may see results of your pathology and/or laboratory studies on MyChart before the doctors have had a chance to review them. We understand that in some cases there may be results that are confusing or concerning to you. Please understand that not all results are received at the same time and often the doctors may need to interpret multiple results in order  to provide you with the best plan of care or course of treatment. Therefore, we ask that you please give [place] 2 business days to thoroughly review all your results before contacting the office for clarification. Should we see a critical lab result, you will be contacted sooner.   If You Need Anything After Your Visit  If you have any questions or concerns for your doctor, please call our main line at [phone] and press option 4 to reach your doctor's medical assistant. If no one answers, please leave a voicemail as directed and we will return your call as soon as possible. Messages left after 4 pm will be answered the following business day.   You may also send [place] a message via [place]. We typically respond to MyChart messages within 1-2 business days.  For prescription refills, please ask your pharmacy to contact our office. Our fax number is [phone].  If you have an urgent issue when the clinic is closed that cannot wait until the next business day, you can page your doctor at the number below.    Please note that while we do our best to be available for urgent issues outside of office hours, we are not available 24/7.   If you have an urgent issue and are unable to reach [place], you may choose to seek medical care at your doctor's office, retail clinic, urgent care center, or emergency room.  If you have a medical emergency, please immediately call 911 or go to the emergency department.  Pager Numbers  - Dr. Nehemiah Massed: [phone]  - Dr. Laurence Ferrari: [phone]  - Dr. Nicole Kindred: [phone]  In the event of inclement weather, please call our main line at [phone] for an update on the status of any delays or closures.  Dermatology Medication Tips: Please keep the boxes that topical medications come in in order to help keep track of the instructions about where and how to use these. Pharmacies typically print the medication instructions only on the boxes and not directly on the  medication tubes.   If your medication is too expensive, please contact our office at [phone] option 4 or send [place] a message through [place].   We are unable to tell what your co-pay for medications will be in advance as this is different depending on your insurance coverage. However, we may be able to find a substitute medication at lower cost or fill out paperwork to get insurance to cover a needed medication.   If a prior authorization is required to get your medication covered by your insurance company, please allow [place] 1-2 business days to complete this process.  Drug prices often vary depending on where the prescription is filled and some pharmacies may offer cheaper prices.  The website www.goodrx.com contains coupons for medications through different pharmacies. The prices here do not account for what the cost may be with help from insurance (it may be cheaper with your insurance), but the website can give you the price if you did not use any  insurance.  - You can print the associated coupon and take it with your prescription to the pharmacy.  - You may also stop by our office during regular business hours and pick up a GoodRx coupon card.  - If you need your prescription sent electronically to a different pharmacy, notify our office through [facility] or by phone at [phone] option 4.     Si Usted Necesita Algo Despus de Su Visita  Tambin puede enviarnos un mensaje a travs de Pharmacist, community. Por lo general respondemos a los mensajes de MyChart en el transcurso de 1 a 2 das hbiles.  Para renovar recetas, por favor pida a su farmacia que se ponga en contacto con nuestra oficina. Harland Dingwall de fax es [place] [phone].  Si tiene un asunto urgente cuando la clnica est cerrada y que no puede esperar hasta el siguiente da hbil, puede llamar/localizar a su doctor(a) al nmero que aparece a continuacin.   Por favor, tenga en cuenta que aunque hacemos todo lo posible  para estar disponibles para asuntos urgentes fuera del horario de [place], no estamos disponibles las 24 horas del da, los 7 das de la [place].   Si tiene un problema urgente y no puede comunicarse con nosotros, puede optar por buscar atencin mdica  en el consultorio de su doctor(a), en una clnica privada, en un centro de atencin urgente o en una sala de emergencias.  Si tiene Engineering geologist, por favor llame inmediatamente al 911 o vaya a la sala de emergencias.  Nmeros de bper  - Dr. Nehemiah Massed: [phone]  - Dra. Moye: [phone]  - Dra. Nicole Kindred: [phone]  En caso de inclemencias del [place], por favor llame a Johnsie Kindred principal al [phone] para una actualizacin sobre el [place] de cualquier retraso o cierre.  Consejos para la medicacin en dermatologa: Por favor, guarde las cajas en las que vienen los medicamentos de uso tpico para ayudarle a seguir las instrucciones sobre dnde y cmo usarlos. Las farmacias generalmente imprimen las instrucciones del medicamento slo en las cajas y no directamente en los tubos del [place].   Si su medicamento es muy caro, por favor, pngase en contacto con Zigmund Daniel llamando al [phone] y presione la opcin 4 o envenos un mensaje a travs de Pharmacist, community.   No podemos decirle cul ser su copago por los medicamentos por adelantado ya que esto es diferente dependiendo de la cobertura de su seguro. Sin embargo, es posible que podamos encontrar un medicamento sustituto a Electrical engineer un formulario para que el seguro cubra el medicamento que se considera necesario.   Si se requiere una autorizacin previa para que su compaa de seguros [place] su medicamento, por favor permtanos de 1 a 2 das hbiles para completar este proceso.  Los precios de los medicamentos varan con frecuencia dependiendo del Environmental consultant de dnde se surte la receta y alguna farmacias pueden ofrecer precios ms baratos.  El sitio web  www.goodrx.com tiene cupones para medicamentos de Airline pilot. Los precios aqu no tienen en cuenta lo que podra costar con la ayuda del seguro (puede ser ms barato con su seguro), pero el sitio web puede darle el precio si no utiliz Research scientist (physical sciences).  - Puede imprimir el cupn correspondiente y llevarlo con su receta a la farmacia.  - Tambin puede pasar por nuestra oficina durante el horario de atencin regular y Charity fundraiser una tarjeta de cupones de GoodRx.  - Si necesita que su receta se enve electrnicamente a Ardelia Mems  farmacia diferente, informe a nuestra oficina a travs de MyChart de [place] o por telfono llamando al [phone] y presione la opcin 4.

## 2022-03-21 NOTE — Progress Notes (Signed)
Follow-Up Visit   Subjective  Seth Doyle is a 66 y.o. male who presents for the following: Actinic Keratosis (6 month follow up. Scalp and temple. Tx with LN2 at last visit. Started 5FU/Calcipotriene cream 8 days ago to scalp) and Annual Exam (HxMIS, right upper forehead. Mohs 11/06/2021). He has a h/o xerosis with pruritus treated with CeraVe/Clobetasol mix.  Needs rfs of Clobetasol and hydroxyzine.  Rash comes and goes on arms and chest and meds help with itching. New rash on R arm.  The patient has spots, moles and lesions to be evaluated, some may be new or changing and the patient has concerns that these could be cancer.   The following portions of the chart were reviewed this encounter and updated as appropriate:      Review of Systems: No other skin or systemic complaints except as noted in HPI or Assessment and Plan.   Objective  Well appearing patient in no apparent distress; mood and affect are within normal limits.  A full examination was performed including scalp, head, eyes, ears, nose, lips, neck, chest, axillae, abdomen, back, buttocks, bilateral upper extremities, bilateral lower extremities, hands, feet, fingers, toes, fingernails, and toenails. All findings within normal limits unless otherwise noted below.  Scalp Erythematous thin papules/macules with gritty scale.   Right Medial Elbow Firm pink annular papules, itches per pt   Assessment & Plan   History of Melanoma in Situ. Right upper forehead. Mohs 11/06/2021 at [facility]. - No evidence of recurrence today - Recommend regular full body skin exams - Recommend daily broad spectrum sunscreen SPF 30+ to sun-exposed areas, reapply every 2 hours as needed.  - Call if any new or changing lesions are noted between office visits   Lentigines. Torso, head, limbs. - Scattered tan macules - Due to sun exposure - Benign-appearing, observe - Recommend daily broad spectrum sunscreen SPF 30+ to sun-exposed areas,  reapply every 2 hours as needed. - Call for any changes  Seborrheic Keratoses. Torso, head, limbs. - Stuck-on, waxy, tan-brown papules and/or plaques  - Benign-appearing - Discussed benign etiology and prognosis. - Observe - Call for any changes  Melanocytic Nevi - Tan-brown and/or pink-flesh-colored symmetric macules and papules - Benign appearing on exam today - Observation - Call clinic for new or changing moles - Recommend daily use of broad spectrum spf 30+ sunscreen to sun-exposed areas.   Hemangiomas - Red papules - Discussed benign nature - Observe - Call for any changes  Actinic Damage with PreCancerous Actinic Keratoses Counseling for Topical Chemotherapy Management: Patient exhibits: - Severe, confluent actinic changes with pre-cancerous actinic keratoses that is secondary to cumulative UV radiation exposure over time - Condition that is severe; chronic, not at goal. - diffuse scaly erythematous macules and papules with underlying dyspigmentation - Discussed Prescription "Field Treatment" topical Chemotherapy for Severe, Chronic Confluent Actinic Changes with Pre-Cancerous Actinic Keratoses Field treatment involves treatment of an entire area of skin that has confluent Actinic Changes (Sun/ Ultraviolet light damage) and PreCancerous Actinic Keratoses by method of PhotoDynamic Therapy (PDT) and/or prescription Topical Chemotherapy agents such as 5-fluorouracil, 5-fluorouracil/calcipotriene, and/or imiquimod.  The purpose is to decrease the number of clinically evident and subclinical PreCancerous lesions to prevent progression to development of skin cancer by chemically destroying early precancer changes that may or may not be visible.  It has been shown to reduce the risk of developing skin cancer in the treated area. As a result of treatment, redness, scaling, crusting, and open sores may occur during  treatment course. One or more than one of these methods may be used and  may have to be used several times to control, suppress and eliminate the PreCancerous changes. Discussed treatment course, expected reaction, and possible side effects. - Recommend daily broad spectrum sunscreen SPF 30+ to sun-exposed areas, reapply every 2 hours as needed.  - Staying in the shade or wearing long sleeves, sun glasses (UVA+UVB protection) and wide brim hats (4-inch brim around the entire circumference of the hat) are also recommended. - Call for new or changing lesions.  Continue 5FU/Calcipotriene cream twice daily to scalp, temples, forehead up to 14 days as tolerated  Skin cancer screening performed today.  Purpura - Chronic; persistent and recurrent.  Treatable, but not curable. - Violaceous macules and patches - Benign - Related to trauma, age, sun damage and/or use of blood thinners, chronic use of topical and/or oral steroids - Observe - Can use OTC arnica containing moisturizer such as Dermend Bruise Formula if desired - Call for worsening or other concerns   AK (actinic keratosis) Scalp  Actinic keratoses are precancerous spots that appear secondary to cumulative UV radiation exposure/sun exposure over time. They are chronic with expected duration over 1 year. A portion of actinic keratoses will progress to squamous cell carcinoma of the skin. It is not possible to reliably predict which spots will progress to skin cancer and so treatment is recommended to prevent development of skin cancer.  Recommend daily broad spectrum sunscreen SPF 30+ to sun-exposed areas, reapply every 2 hours as needed.  Recommend staying in the shade or wearing long sleeves, sun glasses (UVA+UVB protection) and wide brim hats (4-inch brim around the entire circumference of the hat). Call for new or changing lesions.  Continue 5FU/Calcipotriene cream twice daily to scalp, temples, forehead up to 14 days as tolerated Will treat residual Aks with cryotherapy on f/up.  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 Condition is symptomatic/ bothersome to patient. Not currently at goal.   Start Clobetasol cream twice daily to affected areas R arm x 2 weeks and as needed for rash/itching. Avoid applying to face, groin, and axilla. Use as directed. Long-term use can cause thinning of the skin.  Continue Hydroxyzine '25mg'$  1 tablet at bedtime as needed for itching. Continue Clobetasol/CeraVe cream mix to aas body qd/bid prn itchy rash, avoid f/g/a  Topical steroids (such as triamcinolone, fluocinolone, fluocinonide, mometasone, clobetasol, halobetasol, betamethasone, hydrocortisone) can cause thinning and lightening of the skin if they are used for too long in the same area. Your physician has selected the right strength medicine for your problem and area affected on the body. Please use your medication only as directed by your physician to prevent side effects.      clobetasol cream (TEMOVATE) 0.05 % - Right Medial Elbow Apply twice daily to affected areas on body as needed for rash/itching. Avoid applying to face, groin, and axilla.  Xerosis cutis  Related Medications hydrOXYzine (ATARAX) 25 MG tablet Take 1 tablet at bedtime as needed for itching.  clobetasol (TEMOVATE) 0.05 % external solution Apply topically as needed. MIX WITH A JAR OF CERAVE CREAM AND  APPLY TO AA 1-2 TIMES A DAY AS NEEDED FOR ITCHY RASH.   Return in about 6 months (around 09/19/2022) for TBSE, HxMIS.  I, Emelia Salisbury, CMA, am acting as scribe for Brendolyn Patty, MD.  Documentation: I have reviewed the above documentation for accuracy and  completeness, and I agree with the above.  Brendolyn Patty MD

## 2022-04-18 DIAGNOSIS — H8112 Benign paroxysmal vertigo, left ear: Secondary | ICD-10-CM | POA: Diagnosis not present

## 2022-04-18 DIAGNOSIS — R42 Dizziness and giddiness: Secondary | ICD-10-CM | POA: Diagnosis not present

## 2022-04-26 DIAGNOSIS — H8112 Benign paroxysmal vertigo, left ear: Secondary | ICD-10-CM | POA: Diagnosis not present

## 2022-04-30 ENCOUNTER — Encounter: Payer: Self-pay | Admitting: Internal Medicine

## 2022-05-22 ENCOUNTER — Ambulatory Visit: Payer: BC Managed Care – PPO | Admitting: Internal Medicine

## 2022-05-22 ENCOUNTER — Encounter: Payer: Self-pay | Admitting: Internal Medicine

## 2022-05-22 VITALS — BP 128/84 | HR 69 | Temp 97.6°F | Ht 70.0 in | Wt 193.0 lb

## 2022-05-22 DIAGNOSIS — R5383 Other fatigue: Secondary | ICD-10-CM | POA: Diagnosis not present

## 2022-05-22 DIAGNOSIS — E559 Vitamin D deficiency, unspecified: Secondary | ICD-10-CM

## 2022-05-22 DIAGNOSIS — R030 Elevated blood-pressure reading, without diagnosis of hypertension: Secondary | ICD-10-CM

## 2022-05-22 DIAGNOSIS — E785 Hyperlipidemia, unspecified: Secondary | ICD-10-CM

## 2022-05-22 DIAGNOSIS — D509 Iron deficiency anemia, unspecified: Secondary | ICD-10-CM

## 2022-05-22 DIAGNOSIS — R7301 Impaired fasting glucose: Secondary | ICD-10-CM

## 2022-05-22 DIAGNOSIS — Z125 Encounter for screening for malignant neoplasm of prostate: Secondary | ICD-10-CM

## 2022-05-22 DIAGNOSIS — Z0000 Encounter for general adult medical examination without abnormal findings: Secondary | ICD-10-CM

## 2022-05-22 DIAGNOSIS — Z9889 Other specified postprocedural states: Secondary | ICD-10-CM

## 2022-05-22 DIAGNOSIS — Z85820 Personal history of malignant melanoma of skin: Secondary | ICD-10-CM

## 2022-05-22 MED ORDER — AMOXICILLIN-POT CLAVULANATE 875-125 MG PO TABS: 1.0000 | ORAL_TABLET | Freq: Two times a day (BID) | ORAL | 0 refills | Status: DC

## 2022-05-22 MED ORDER — ZOLPIDEM TARTRATE 10 MG PO TABS: ORAL_TABLET | ORAL | 5 refills | Status: DC

## 2022-05-22 NOTE — Progress Notes (Deleted)
Subjective:  Patient ID: Seth Doyle, male    DOB: [birth year]  Age: 67 y.o. MRN: [id number]  CC: The primary encounter diagnosis was Iron deficiency anemia, unspecified iron deficiency anemia type. Diagnoses of Fatigue, unspecified type, Impaired fasting glucose, Elevated blood pressure reading, Hyperlipidemia, unspecified hyperlipidemia type, and Prostate cancer screening were also pertinent to this visit.   HPI Seth Doyle presents for  Chief Complaint  Patient presents with   Medical Management of Chronic Issues      Outpatient Medications Prior to Visit  Medication Sig Dispense Refill   Ascorbic Acid (VITAMIN C) 100 MG tablet Take 100 mg by mouth daily.     clobetasol (TEMOVATE) 0.05 % external solution Apply topically as needed. MIX WITH A JAR OF CERAVE CREAM AND  APPLY TO AA 1-2 TIMES A DAY AS NEEDED FOR ITCHY RASH. 50 mL 1   clobetasol cream (TEMOVATE) 0.05 % Apply twice daily to affected areas on body as needed for rash/itching. Avoid applying to face, groin, and axilla. 30 g 2   hydrOXYzine (ATARAX) 25 MG tablet Take 1 tablet at bedtime as needed for itching. 30 tablet 2   Multiple Vitamin (MULTIVITAMIN) tablet Take 1 tablet by mouth daily.     zolpidem (AMBIEN) 10 MG tablet TAKE 1/2 TABLET BY MOUTH EVERY NIGHT AT BEDTIME AS NEEDED FOR SLEEP 30 tablet 2   fluorouracil (EFUDEX) 5 % cream Apply topically 2 (two) times daily. Apply to scalp 7- 14 days (Patient not taking: Reported on 05/22/2022) 15 g 1   neomycin-polymyxin b-dexamethasone (MAXITROL) 3.5-10000-0.1 SUSP Place 2 drops into the right eye every 6 (six) hours. 5 mL 0   ondansetron (ZOFRAN) 4 MG tablet Take 1 tablet (4 mg total) by mouth every 8 (eight) hours as needed for nausea or vomiting. 20 tablet 0   No facility-administered medications prior to visit.    Review of Systems;  Patient denies headache, fevers, malaise, unintentional weight loss, skin rash, eye pain, sinus congestion and sinus pain, sore  throat, dysphagia,  hemoptysis , cough, dyspnea, wheezing, chest pain, palpitations, orthopnea, edema, abdominal pain, nausea, melena, diarrhea, constipation, flank pain, dysuria, hematuria, urinary  Frequency, nocturia, numbness, tingling, seizures,  Focal weakness, Loss of consciousness,  Tremor, insomnia, depression, anxiety, and suicidal ideation.      Objective:  BP 128/84   Pulse 69   Temp 97.6 F (36.4 C) (Oral)   Ht '5\' 10"'$  (1.778 m)   Wt 193 lb (87.5 kg)   SpO2 97%   BMI 27.69 kg/m   BP Readings from Last 3 Encounters:  05/22/22 128/84  02/23/22 121/70  07/10/21 130/70    Wt Readings from Last 3 Encounters:  05/22/22 193 lb (87.5 kg)  02/23/22 183 lb (83 kg)  07/10/21 191 lb 12.8 oz (87 kg)    Physical Exam  Lab Results  Component Value Date   HGBA1C 5.7 09/20/2017   HGBA1C 5.7 01/06/2016    Lab Results  Component Value Date   CREATININE 1.22 07/22/2020   CREATININE 1.38 07/11/2020   CREATININE 1.22 09/25/2019    Lab Results  Component Value Date   WBC 14.8 (H) 12/16/2020   HGB 15.4 12/16/2020   HCT 43.3 12/16/2020   PLT 165 12/16/2020   GLUCOSE 102 (H) 07/22/2020   CHOL CANCELED 01/17/2018   TRIG CANCELED 01/17/2018   HDL CANCELED 01/17/2018   LDLDIRECT 69.0 02/28/2016   LDLCALC 90 12/07/2016   ALT 19 07/11/2020   AST 21 07/11/2020  NA 138 07/22/2020   K 4.1 07/22/2020   CL 104 07/22/2020   CREATININE 1.22 07/22/2020   BUN 27 (H) 07/22/2020   CO2 27 07/22/2020   TSH 1.239 10/05/2019   PSA 0.93 07/11/2020   HGBA1C 5.7 09/20/2017   MICROALBUR 0.7 02/28/2016    CT Angio Chest/Abd/Pel for Dissection W and/or Wo Contrast  Result Date: 08/18/2019 CLINICAL DATA:  Right lower quadrant abdominal pain EXAM: CT ANGIOGRAPHY CHEST, ABDOMEN AND PELVIS TECHNIQUE: Multidetector CT imaging through the chest, abdomen and pelvis was performed using the standard protocol during bolus administration of intravenous contrast. Multiplanar reconstructed images  and MIPs were obtained and reviewed to evaluate the vascular anatomy. CONTRAST:  174m OMNIPAQUE IOHEXOL 350 MG/ML SOLN COMPARISON:  06/12/2019 FINDINGS: CTA CHEST FINDINGS Cardiovascular: This is a technically adequate evaluation of the thoracic aorta. No aneurysm or dissection. The heart is unremarkable without pericardial effusion. While not optimized for opacification of the pulmonary vasculature, there is sufficient contrast enhancement to exclude pulmonary emboli. Mediastinum/Nodes: No enlarged mediastinal, hilar, or axillary lymph nodes. Thyroid gland, trachea, and esophagus demonstrate no significant findings. Lungs/Pleura: No airspace disease, effusion, or pneumothorax. Central airways are widely patent. Musculoskeletal: No acute displaced fracture. Reconstructed images demonstrate no additional findings. Review of the MIP images confirms the above findings. CTA ABDOMEN AND PELVIS FINDINGS VASCULAR Aorta: Normal caliber aorta without aneurysm, dissection, vasculitis or significant stenosis. Celiac: Patent without evidence of aneurysm, dissection, vasculitis or significant stenosis. SMA: Patent without evidence of aneurysm, dissection, vasculitis or significant stenosis. Renals: There are 2 renal arteries bilaterally, which are widely patent without significant atherosclerosis or stenosis. IMA: Patent without evidence of aneurysm, dissection, vasculitis or significant stenosis. Inflow: Patent without evidence of aneurysm, dissection, vasculitis or significant stenosis. Veins: No obvious venous abnormality within the limitations of this arterial phase study. Review of the MIP images confirms the above findings. NON-VASCULAR Hepatobiliary: No focal liver abnormality is seen. No gallstones, gallbladder wall thickening, or biliary dilatation. Pancreas: Unremarkable. No pancreatic ductal dilatation or surrounding inflammatory changes. Spleen: Normal in size without focal abnormality. Heterogeneous enhancement  related to arterial phase of exam. Adrenals/Urinary Tract: There is a punctate 2 mm obstructing mid right ureteral calculus reference image 134, with right-sided obstructive uropathy as result. Left kidney is unremarkable. Stable right adrenal adenoma. Left adrenal is normal. Bladder is unremarkable. Stomach/Bowel: Scattered colonic diverticulosis without diverticulitis. Normal appendix. No bowel obstruction or ileus. Postsurgical changes from prior partial sigmoid colon resection and reanastomosis. Lymphatic: No pathologic adenopathy within the abdomen or pelvis. Reproductive: Prostate is unremarkable. Other: No abdominal wall hernia or abnormality. No abdominopelvic ascites. Musculoskeletal: No acute or destructive bony lesions. Reconstructed images demonstrate no additional findings. Review of the MIP images confirms the above findings. IMPRESSION: 1. No evidence of aneurysm or dissection within the thoracoabdominal aorta. 2. Punctate less than 2 mm obstructing calculus mid right ureter, with mild right-sided obstructive uropathy. 3. Diverticulosis without diverticulitis. 4. Otherwise no acute intrathoracic, intra-abdominal, or intrapelvic process. Electronically Signed   By: MRanda NgoM.D.   On: 08/18/2019 15:15    Assessment & Plan:  .Iron deficiency anemia, unspecified iron deficiency anemia type  Fatigue, unspecified type  Impaired fasting glucose  Elevated blood pressure reading  Hyperlipidemia, unspecified hyperlipidemia type  Prostate cancer screening     I provided 30 minutes of face-to-face time during this encounter reviewing patient's last visit with me, patient's  most recent visit with cardiology,  nephrology,  and neurology,  recent surgical and non surgical procedures, previous  labs and imaging studies, counseling on currently addressed issues,  and post visit ordering to diagnostics and therapeutics .   Follow-up: No follow-ups on file.   Crecencio Mc, MD

## 2022-05-22 NOTE — Assessment & Plan Note (Signed)

## 2022-05-22 NOTE — Assessment & Plan Note (Signed)
S/p Moh;s procedure by Anne Fu in August.  Healing well.

## 2022-05-22 NOTE — Progress Notes (Signed)
Patient ID: Seth Doyle, male    DOB: 08/12/1955  Age: 67 y.o. MRN: [id number]  The patient is here for annual preventive examination and management of other chronic and acute problems.   The risk factors are reflected in the social history.   The roster of all physicians providing medical care to patient - is listed in the Snapshot section of the chart.   Activities of daily living:  The patient is 100% independent in all ADLs: dressing, toileting, feeding as well as independent mobility   Home safety : The patient has smoke detectors in the home. They wear seatbelts.  There are no unsecured firearms at home. There is no violence in the home.    There is no risks for hepatitis, STDs or HIV. There is no   history of blood transfusion. They have no travel history to infectious disease endemic areas of the world.   The patient has seen their dentist in the last six month. They have seen their eye doctor in the last year. The patinet  denies slight hearing difficulty with regard to whispered voices and some television programs.  They have deferred audiologic testing in the last year.  They do not  have excessive sun exposure. Discussed the need for sun protection: hats, long sleeves and use of sunscreen if there is significant sun exposure.    Diet: the importance of a healthy diet is discussed. They do have a healthy diet.   The benefits of regular aerobic exercise were discussed. The patient  exercises  3 to 5 days per week  for  60 minutes.    Depression screen: there are no signs or vegative symptoms of depression- irritability, change in appetite, anhedonia, sadness/tearfullness.   The following portions of the patient's history were reviewed and updated as appropriate: allergies, current medications, past family history, past medical history,  past surgical history, past social history  and problem list.   Visual acuity was not assessed per patient preference since the patient has  regular follow up with an  ophthalmologist. Hearing and body mass index were assessed and reviewed.    During the course of the visit the patient was educated and counseled about appropriate screening and preventive services including : fall prevention , diabetes screening, nutrition counseling, colorectal cancer screening, and recommended immunizations.    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 Refills needed   3) SELF TREATED  an URI over Christmas which  progressed to sinusitis.  Used augmentin    Review of Symptoms  Patient denies headache, fevers, malaise, unintentional weight loss, skin rash, eye pain, sinus congestion and sinus pain, sore throat, dysphagia,  hemoptysis , cough, dyspnea, wheezing, chest pain, palpitations, orthopnea, edema, abdominal pain, nausea, melena, diarrhea, constipation, flank pain, dysuria, hematuria, urinary  Frequency, nocturia, numbness, tingling, seizures,  Focal weakness, Loss of consciousness,  Tremor,  depression, anxiety, and suicidal ideation.    Physical Exam:  BP 128/84   Pulse 69   Temp 97.6 F (36.4 C) (Oral)   Ht '5\' 10"'$  (1.778 m)   Wt 193 lb (87.5 kg)   SpO2 97%   BMI 27.69 kg/m    Physical Exam Vitals reviewed.  Constitutional:      General: He is not in acute distress.    Appearance: Normal appearance. He is normal weight. He is not ill-appearing, toxic-appearing or diaphoretic.  HENT:  Head: Normocephalic and atraumatic.     Right Ear: Tympanic membrane, ear canal and external ear normal. There is no impacted cerumen.     Left Ear: Tympanic membrane, ear canal and external ear normal. There is no impacted cerumen.     Nose: Nose normal.     Mouth/Throat:     Mouth: Mucous membranes are moist.     Pharynx: Oropharynx is clear.  Eyes:     General: No scleral icterus.       Right eye: No discharge.        Left eye: No discharge.      Conjunctiva/sclera: Conjunctivae normal.  Neck:     Thyroid: No thyromegaly.     Vascular: No carotid bruit or JVD.  Cardiovascular:     Rate and Rhythm: Normal rate and regular rhythm.     Heart sounds: Normal heart sounds.  Pulmonary:     Effort: Pulmonary effort is normal. No respiratory distress.     Breath sounds: Normal breath sounds.  Abdominal:     General: Bowel sounds are normal.     Palpations: Abdomen is soft. There is no mass.     Tenderness: There is no abdominal tenderness. There is no guarding or rebound.  Musculoskeletal:        General: Normal range of motion.     Cervical back: Normal range of motion and neck supple.  Lymphadenopathy:     Cervical: No cervical adenopathy.  Skin:    General: Skin is warm and dry.  Neurological:     General: No focal deficit present.     Mental Status: He is alert and oriented to person, place, and time. Mental status is at baseline.  Psychiatric:        Mood and Affect: Mood normal.        Behavior: Behavior normal.        Thought Content: Thought content normal.        Judgment: Judgment normal.     Assessment and Plan: Iron deficiency anemia, unspecified iron deficiency anemia type -     CBC with Differential/Platelet  Fatigue, unspecified type -     TSH  Impaired fasting glucose -     Comprehensive metabolic panel -     Hemoglobin A1c  Elevated blood pressure reading -     Comprehensive metabolic panel -     Microalbumin / creatinine urine ratio  Hyperlipidemia, unspecified hyperlipidemia type -     Lipid panel -     LDL cholesterol, direct  Prostate cancer screening -     PSA  Vitamin D deficiency -     VITAMIN D 25 Hydroxy (Vit-D Deficiency, Fractures)  Encounter for preventive health examination Assessment & Plan: age appropriate education and counseling updated, referrals for preventative services and immunizations addressed, dietary and smoking counseling addressed, most recent labs reviewed.   I have personally reviewed and have noted:   1) the patient's medical and social history 2) The pt's use of alcohol, tobacco, and illicit drugs 3) The patient's current medications and supplements 4) Functional ability including ADL's, fall risk, home safety risk, hearing and visual impairment 5) Diet and physical activities 6) Evidence for depression or mood disorder 7) The patient's height, weight, and BMI have been recorded in the chart   I have made referrals, and provided counseling and education based on review of the above    History of melanoma excision Assessment & Plan: S/p Moh;s procedure by Anne Fu in  August.  Healing well.     Other orders -     Amoxicillin-Pot Clavulanate; Take 1 tablet by mouth 2 (two) times daily.  Dispense: 14 tablet; Refill: 0 -     Zolpidem Tartrate; TAKE 1/2 TABLET BY MOUTH EVERY NIGHT AT BEDTIME AS NEEDED FOR SLEEP  Dispense: 30 tablet; Refill: 5    No follow-ups on file.  Crecencio Mc, MD

## 2022-05-23 LAB — LIPID PANEL
Cholesterol: 131 mg/dL (ref 0–200)
HDL: 34.3 mg/dL — ABNORMAL LOW (ref >39.00)
LDL Cholesterol: 73 mg/dL (ref 0–99)
NonHDL: 97.06
Total CHOL/HDL Ratio: 4
Triglycerides: 120 mg/dL (ref 0.0–149.0)
VLDL: 24 mg/dL (ref 0.0–40.0)

## 2022-05-23 LAB — MICROALBUMIN / CREATININE URINE RATIO
Creatinine,U: 55.6 mg/dL
Microalb Creat Ratio: 1.3 mg/g (ref 0.0–30.0)
Microalb, Ur: <0.7 mg/dL (ref 0.0–1.9)

## 2022-05-23 LAB — CBC WITH DIFFERENTIAL/PLATELET
Basophils Absolute: 0 10*3/uL (ref 0.0–0.1)
Basophils Relative: 0.5 % (ref 0.0–3.0)
Eosinophils Absolute: 0 10*3/uL (ref 0.0–0.7)
Eosinophils Relative: 1 % (ref 0.0–5.0)
HCT: 43.2 % (ref 39.0–52.0)
Hemoglobin: 14.7 g/dL (ref 13.0–17.0)
Lymphocytes Relative: 29.5 % (ref 12.0–46.0)
Lymphs Abs: 1.5 10*3/uL (ref 0.7–4.0)
MCHC: 33.9 g/dL (ref 30.0–36.0)
MCV: 88.8 fl (ref 78.0–100.0)
Monocytes Absolute: 0.4 10*3/uL (ref 0.1–1.0)
Monocytes Relative: 8.9 % (ref 3.0–12.0)
Neutro Abs: 3 10*3/uL (ref 1.4–7.7)
Neutrophils Relative %: 60.1 % (ref 43.0–77.0)
Platelets: 215 10*3/uL (ref 150.0–400.0)
RBC: 4.87 Mil/uL (ref 4.22–5.81)
RDW: 12.2 % (ref 11.5–15.5)
WBC: 5 10*3/uL (ref 4.0–10.5)

## 2022-05-23 LAB — TSH: TSH: 0.53 u[IU]/mL (ref 0.35–5.50)

## 2022-05-23 LAB — COMPREHENSIVE METABOLIC PANEL
ALT: 24 U/L (ref 0–53)
AST: 22 U/L (ref 0–37)
Albumin: 4.3 g/dL (ref 3.5–5.2)
Alkaline Phosphatase: 55 U/L (ref 39–117)
BUN: 30 mg/dL — ABNORMAL HIGH (ref 6–23)
CO2: 24 mEq/L (ref 19–32)
Calcium: 9.3 mg/dL (ref 8.4–10.5)
Chloride: 104 mEq/L (ref 96–112)
Creatinine, Ser: 1.01 mg/dL (ref 0.40–1.50)
GFR: 78.43 mL/min (ref >60.00)
Glucose, Bld: 88 mg/dL (ref 70–99)
Potassium: 4 mEq/L (ref 3.5–5.1)
Sodium: 137 mEq/L (ref 135–145)
Total Bilirubin: 0.5 mg/dL (ref 0.2–1.2)
Total Protein: 6.6 g/dL (ref 6.0–8.3)

## 2022-05-23 LAB — HEMOGLOBIN A1C: Hgb A1c MFr Bld: 5.6 % (ref 4.6–6.5)

## 2022-05-23 LAB — PSA: PSA: 0.85 ng/mL (ref 0.10–4.00)

## 2022-05-23 LAB — LDL CHOLESTEROL, DIRECT: Direct LDL: 82 mg/dL

## 2022-05-23 LAB — VITAMIN D 25 HYDROXY (VIT D DEFICIENCY, FRACTURES): VITD: 23.48 ng/mL — ABNORMAL LOW (ref 30.00–100.00)

## 2022-05-24 MED ORDER — ERGOCALCIFEROL 1.25 MG (50000 UT) PO CAPS: 50000.0000 [IU] | ORAL_CAPSULE | ORAL | 0 refills | Status: DC

## 2022-05-24 NOTE — Assessment & Plan Note (Signed)
Replacing with mega dose given recent history of melanoma excision

## 2022-05-24 NOTE — Addendum Note (Signed)
Addended by: Crecencio Mc on: 05/24/2022 09:56 PM   Modules accepted: Orders

## 2022-05-27 ENCOUNTER — Encounter: Payer: Self-pay | Admitting: Internal Medicine

## 2022-05-27 DIAGNOSIS — R030 Elevated blood-pressure reading, without diagnosis of hypertension: Secondary | ICD-10-CM

## 2022-05-29 NOTE — Assessment & Plan Note (Signed)
Home readings in January 2024  have been < 130/80 on 3 separate occasions

## 2022-07-11 DIAGNOSIS — M25552 Pain in left hip: Secondary | ICD-10-CM | POA: Diagnosis not present

## 2022-08-15 ENCOUNTER — Telehealth: Payer: BC Managed Care – PPO | Admitting: Nurse Practitioner

## 2022-08-15 DIAGNOSIS — H1032 Unspecified acute conjunctivitis, left eye: Secondary | ICD-10-CM | POA: Diagnosis not present

## 2022-08-15 MED ORDER — POLYMYXIN B-TRIMETHOPRIM 10000-0.1 UNIT/ML-% OP SOLN: 1.0000 [drp] | Freq: Four times a day (QID) | OPHTHALMIC | 0 refills | Status: AC

## 2022-08-15 NOTE — Progress Notes (Signed)
E-Visit for Pink Eye   We are sorry that you are not feeling well.  Here is how we plan to help!  Based on what you have shared with me it looks like you have conjunctivitis.  Conjunctivitis is a common inflammatory or infectious condition of the eye that is often referred to as "pink eye".  In most cases it is contagious (viral or bacterial). However, not all conjunctivitis requires antibiotics (ex. Allergic).  We have made appropriate suggestions for you based upon your presentation.  I have prescribed Polytrim Ophthalmic drops 1-2 drops 4 times a day times 5 days  Pink eye can be highly contagious.  It is typically spread through direct contact with secretions, or contaminated objects or surfaces that one may have touched.  Strict handwashing is suggested with soap and water is urged.  If not available, use alcohol based had sanitizer.  Avoid unnecessary touching of the eye.  If you wear contact lenses, you will need to refrain from wearing them until you see no white discharge from the eye for at least 24 hours after being on medication.  You should see symptom improvement in 1-2 days after starting the medication regimen.  Call us if symptoms are not improved in 1-2 days.  Home Care: Wash your hands often! Do not wear your contacts until you complete your treatment plan. Avoid sharing towels, bed linen, personal items with a person who has pink eye. See attention for anyone in your home with similar symptoms.  Get Help Right Away If: Your symptoms do not improve. You develop blurred or loss of vision. Your symptoms worsen (increased discharge, pain or redness)   Thank you for choosing an e-visit.  Your e-visit answers were reviewed by a board certified advanced clinical practitioner to complete your personal care plan. Depending upon the condition, your plan could have included both over the counter or prescription medications.  Please review your pharmacy choice. Make sure the  pharmacy is open so you can pick up prescription now. If there is a problem, you may contact your provider through MyChart messaging and have the prescription routed to another pharmacy.  Your safety is important to us. If you have drug allergies check your prescription carefully.   For the next 24 hours you can use MyChart to ask questions about today's visit, request a non-urgent call back, or ask for a work or school excuse. You will get an email in the next two days asking about your experience. I hope that your e-visit has been valuable and will speed your recovery.  Meds ordered this encounter  Medications   trimethoprim-polymyxin b (POLYTRIM) ophthalmic solution    Sig: Place 1 drop into the left eye 4 (four) times daily for 5 days.    Dispense:  10 mL    Refill:  0     I spent approximately 5 minutes reviewing the patient's history, current symptoms and coordinating their care today.   

## 2022-08-16 ENCOUNTER — Other Ambulatory Visit: Payer: Self-pay | Admitting: Internal Medicine

## 2022-08-16 NOTE — Telephone Encounter (Signed)
Refilled: 05/24/2022 Last OV: 05/24/2022 Next OV: not scheduled Last Lab: 05/22/2022

## 2022-09-18 ENCOUNTER — Ambulatory Visit: Payer: BC Managed Care – PPO | Admitting: Dermatology

## 2022-09-18 DIAGNOSIS — D2339 Other benign neoplasm of skin of other parts of face: Secondary | ICD-10-CM

## 2022-09-18 DIAGNOSIS — D229 Melanocytic nevi, unspecified: Secondary | ICD-10-CM

## 2022-09-18 DIAGNOSIS — Z86006 Personal history of melanoma in-situ: Secondary | ICD-10-CM

## 2022-09-18 DIAGNOSIS — L853 Xerosis cutis: Secondary | ICD-10-CM

## 2022-09-18 DIAGNOSIS — L738 Other specified follicular disorders: Secondary | ICD-10-CM

## 2022-09-18 DIAGNOSIS — Z1283 Encounter for screening for malignant neoplasm of skin: Secondary | ICD-10-CM | POA: Diagnosis not present

## 2022-09-18 DIAGNOSIS — L814 Other melanin hyperpigmentation: Secondary | ICD-10-CM

## 2022-09-18 DIAGNOSIS — D239 Other benign neoplasm of skin, unspecified: Secondary | ICD-10-CM

## 2022-09-18 DIAGNOSIS — L821 Other seborrheic keratosis: Secondary | ICD-10-CM

## 2022-09-18 DIAGNOSIS — L578 Other skin changes due to chronic exposure to nonionizing radiation: Secondary | ICD-10-CM

## 2022-09-18 DIAGNOSIS — L309 Dermatitis, unspecified: Secondary | ICD-10-CM

## 2022-09-18 MED ORDER — HYDROXYZINE HCL 25 MG PO TABS: ORAL_TABLET | ORAL | 3 refills | Status: DC

## 2022-09-18 NOTE — Patient Instructions (Addendum)
     Melanoma ABCDEs  Melanoma is the most dangerous type of skin cancer, and is the leading cause of death from skin disease.  You are more likely to develop melanoma if you: Have light-colored skin, light-colored eyes, or red or blond hair Spend a lot of time in the sun Tan regularly, either outdoors or in a tanning bed Have had blistering sunburns, especially during childhood Have a close family member who has had a melanoma Have atypical moles or large birthmarks  Early detection of melanoma is key since treatment is typically straightforward and cure rates are extremely high if we catch it early.   The first sign of melanoma is often a change in a mole or a new dark spot.  The ABCDE system is a way of remembering the signs of melanoma.  A for asymmetry:  The two halves do not match. B for border:  The edges of the growth are irregular. C for color:  A mixture of colors are present instead of an even brown color. D for diameter:  Melanomas are usually (but not always) greater than 6mm - the size of a pencil eraser. E for evolution:  The spot keeps changing in size, shape, and color.  Please check your skin once per month between visits. You can use a small mirror in front and a large mirror behind you to keep an eye on the back side or your body.   If you see any new or changing lesions before your next follow-up, please call to schedule a visit.  Please continue daily skin protection including broad spectrum sunscreen SPF 30+ to sun-exposed areas, reapplying every 2 hours as needed when you're outdoors.   Staying in the shade or wearing long sleeves, sun glasses (UVA+UVB protection) and wide brim hats (4-inch brim around the entire circumference of the hat) are also recommended for sun protection.    Due to recent changes in healthcare laws, you may see results of your pathology and/or laboratory studies on MyChart before the doctors have had a chance to review them. We  understand that in some cases there may be results that are confusing or concerning to you. Please understand that not all results are received at the same time and often the doctors may need to interpret multiple results in order to provide you with the best plan of care or course of treatment. Therefore, we ask that you please give us 2 business days to thoroughly review all your results before contacting the office for clarification. Should we see a critical lab result, you will be contacted sooner.   If You Need Anything After Your Visit  If you have any questions or concerns for your doctor, please call our main line at [phone] and press option 4 to reach your doctor's medical assistant. If no one answers, please leave a voicemail as directed and we will return your call as soon as possible. Messages left after 4 pm will be answered the following business day.   You may also send us a message via MyChart. We typically respond to MyChart messages within 1-2 business days.  For prescription refills, please ask your pharmacy to contact our office. Our fax number is [phone].  If you have an urgent issue when the clinic is closed that cannot wait until the next business day, you can page your doctor at the number below.    Please note that while we do our best to be available for urgent issues   outside of office hours, we are not available 24/7.   If you have an urgent issue and are unable to reach us, you may choose to seek medical care at your doctor's office, retail clinic, urgent care center, or emergency room.  If you have a medical emergency, please immediately call 911 or go to the emergency department.  Pager Numbers  - Dr. Kowalski: [phone]  - Dr. Moye: [phone]  - Dr. Stewart: [phone]  In the event of inclement weather, please call our main line at [phone] for an update on the status of any delays or closures.  Dermatology Medication Tips: Please  keep the boxes that topical medications come in in order to help keep track of the instructions about where and how to use these. Pharmacies typically print the medication instructions only on the boxes and not directly on the medication tubes.   If your medication is too expensive, please contact our office at [phone] option 4 or send us a message through MyChart.   We are unable to tell what your co-pay for medications will be in advance as this is different depending on your insurance coverage. However, we may be able to find a substitute medication at lower cost or fill out paperwork to get insurance to cover a needed medication.   If a prior authorization is required to get your medication covered by your insurance company, please allow us 1-2 business days to complete this process.  Drug prices often vary depending on where the prescription is filled and some pharmacies may offer cheaper prices.  The website www.goodrx.com contains coupons for medications through different pharmacies. The prices here do not account for what the cost may be with help from insurance (it may be cheaper with your insurance), but the website can give you the price if you did not use any insurance.  - You can print the associated coupon and take it with your prescription to the pharmacy.  - You may also stop by our office during regular business hours and pick up a GoodRx coupon card.  - If you need your prescription sent electronically to a different pharmacy, notify our office through [facility] MyChart or by phone at [phone] option 4.     Si Usted Necesita Algo Despus de Su Visita  Tambin puede enviarnos un mensaje a travs de MyChart. Por lo general respondemos a los mensajes de MyChart en el transcurso de 1 a 2 das hbiles.  Para renovar recetas, por favor pida a su farmacia que se ponga en contacto con nuestra oficina. Nuestro nmero de fax es el [phone].  Si tiene un asunto urgente  cuando la clnica est cerrada y que no puede esperar hasta el siguiente da hbil, puede llamar/localizar a su doctor(a) al nmero que aparece a continuacin.   Por favor, tenga en cuenta que aunque hacemos todo lo posible para estar disponibles para asuntos urgentes fuera del horario de oficina, no estamos disponibles las 24 horas del da, los 7 das de la semana.   Si tiene un problema urgente y no puede comunicarse con nosotros, puede optar por buscar atencin mdica  en el consultorio de su doctor(a), en una clnica privada, en un centro de atencin urgente o en una sala de emergencias.  Si tiene una emergencia mdica, por favor llame inmediatamente al 911 o vaya a la sala de emergencias.  Nmeros de bper  - Dr. Kowalski: [phone]  - Dra. Moye: [phone]  - Dra. Stewart: [phone]  En caso   de inclemencias del tiempo, por favor llame a nuestra lnea principal al [phone] para una actualizacin sobre el estado de cualquier retraso o cierre.  Consejos para la medicacin en dermatologa: Por favor, guarde las cajas en las que vienen los medicamentos de uso tpico para ayudarle a seguir las instrucciones sobre dnde y cmo usarlos. Las farmacias generalmente imprimen las instrucciones del medicamento slo en las cajas y no directamente en los tubos del medicamento.   Si su medicamento es muy caro, por favor, pngase en contacto con nuestra oficina llamando al [phone] y presione la opcin 4 o envenos un mensaje a travs de MyChart.   No podemos decirle cul ser su copago por los medicamentos por adelantado ya que esto es diferente dependiendo de la cobertura de su seguro. Sin embargo, es posible que podamos encontrar un medicamento sustituto a menor costo o llenar un formulario para que el seguro cubra el medicamento que se considera necesario.   Si se requiere una autorizacin previa para que su compaa de seguros cubra su medicamento, por favor permtanos de 1 a 2  das hbiles para completar este proceso.  Los precios de los medicamentos varan con frecuencia dependiendo del lugar de dnde se surte la receta y alguna farmacias pueden ofrecer precios ms baratos.  El sitio web www.goodrx.com tiene cupones para medicamentos de diferentes farmacias. Los precios aqu no tienen en cuenta lo que podra costar con la ayuda del seguro (puede ser ms barato con su seguro), pero el sitio web puede darle el precio si no utiliz ningn seguro.  - Puede imprimir el cupn correspondiente y llevarlo con su receta a la farmacia.  - Tambin puede pasar por nuestra oficina durante el horario de atencin regular y recoger una tarjeta de cupones de GoodRx.  - Si necesita que su receta se enve electrnicamente a una farmacia diferente, informe a nuestra oficina a travs de MyChart de [facility] o por telfono llamando al [phone] y presione la opcin 4.  

## 2022-09-18 NOTE — Progress Notes (Signed)
Follow-Up Visit   Subjective  OBIE KALLENBACH is a 67 y.o. male who presents for the following: Skin Cancer Screening and Full Body Skin Exam  The patient presents for Total-Body Skin Exam (TBSE) for skin cancer screening and mole check. The patient has spots, moles and lesions to be evaluated, some may be new or changing.  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 He has GA of the elbows and uses Clobetasol Cream prn, Clobetasol/CeraVe mix, and hydroxyzine 25 MG prn itch.   The following portions of the chart were reviewed this encounter and updated as appropriate: medications, allergies, medical history  Review of Systems:  No other skin or systemic complaints except as noted in HPI or Assessment and Plan.  Objective  Well appearing patient in no apparent distress; mood and affect are within normal limits.  A full examination was performed including scalp, head, eyes, ears, nose, lips, neck, chest, axillae, abdomen, back, buttocks, bilateral upper extremities, bilateral lower extremities, hands, feet, fingers, toes, fingernails, and toenails. All findings within normal limits unless otherwise noted below.   Relevant physical exam findings are noted in the Assessment and Plan.  Right Upper Eyelid 6.0 mm soft tranlucent papule at lateral R upper eyelid   Assessment & Plan   LENTIGINES, SEBORRHEIC KERATOSES, HEMANGIOMAS - Benign normal skin lesions - Benign-appearing - Call for any changes  MELANOCYTIC NEVI - Tan-brown and/or pink-flesh-colored symmetric macules and papules - Benign appearing on exam today - Observation - Call clinic for new or changing moles - Recommend daily use of broad spectrum spf 30+ sunscreen to sun-exposed areas.   ACTINIC DAMAGE - Chronic condition, secondary to cumulative UV/sun exposure - diffuse scaly erythematous macules with  underlying dyspigmentation - Recommend daily broad spectrum sunscreen SPF 30+ to sun-exposed areas, reapply every 2 hours as needed.  - Staying in the shade or wearing long sleeves, sun glasses (UVA+UVB protection) and wide brim hats (4-inch brim around the entire circumference of the hat) are also recommended for sun protection.  - Call for new or changing lesions. - Good result from 5FU/Calcipotriene Cream treatment.   SKIN CANCER SCREENING PERFORMED TODAY.  HISTORY OF MELANOMA IN SITU - Right upper forehead, Mohs 11/06/20[facility] at [facility]. - No evidence of recurrence today of the right upper forehead - Recommend regular full body skin exams - Recommend daily broad spectrum sunscreen SPF 30+ to sun-exposed areas, reapply every 2 hours as needed.  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 Condition is symptomatic/ bothersome to patient. Improves with treatment but not currently at goal.    Treatment Plan: Continue Hydroxyzine 25mg  1 tablet at bedtime as needed for itching dsp #30 3Rf. Continue Clobetasol/CeraVe cream mix to aas body qd/bid prn itchy rash, avoid f/g/a. Continue Clobetasol Cream QD/BID prn itch to more severe areas.    Topical steroids (such as triamcinolone, fluocinolone, fluocinonide, mometasone, clobetasol, halobetasol, betamethasone, hydrocortisone) can cause thinning and lightening of the skin if they are used for too long in the same area. Your physician has selected the right strength medicine for your problem and area affected on the body. Please use your medication only as directed by your physician to prevent side effects.  Sebaceous Hyperplasia - Small yellow papules with a central dell - Benign-appearing - Observe. Call for changes.  Hydrocystoma Right Upper  Eyelid  Benign-appearing.  Observation.  Call clinic for new or changing lesions.  Recommend daily use of broad spectrum spf 30+ sunscreen to sun-exposed areas.    Xerosis cutis  Related Medications clobetasol (TEMOVATE) 0.05 % external solution Apply topically as needed. MIX WITH A JAR OF CERAVE CREAM AND  APPLY TO AA 1-2 TIMES A DAY AS NEEDED FOR ITCHY RASH.  hydrOXYzine (ATARAX) 25 MG tablet Take 1 tablet at bedtime as needed for itching.   Return in about 6 months (around 03/20/2023) for UBSE, Hx melanoma, Hx AKs.  ICherlyn Labella, CMA, am acting as scribe for Willeen Niece, MD .   Documentation: I have reviewed the above documentation for accuracy and completeness, and I agree with the above.  Willeen Niece, MD

## 2022-09-27 ENCOUNTER — Ambulatory Visit: Payer: BC Managed Care – PPO | Admitting: Dermatology

## 2022-10-07 ENCOUNTER — Encounter: Payer: Self-pay | Admitting: Internal Medicine

## 2022-10-08 NOTE — Telephone Encounter (Signed)
Pt called in to make an appt with Tullo, per previous message. Next available opening with her its 11/20/22, as per pt that's too far out. I asked if he can see another provider, he said yes. So I booked him with Evelene Croon for 10/12/22 @11 :20am.

## 2022-10-08 NOTE — Telephone Encounter (Signed)
noted 

## 2022-10-12 ENCOUNTER — Ambulatory Visit: Payer: BC Managed Care – PPO | Admitting: Nurse Practitioner

## 2022-10-12 VITALS — BP 124/72 | HR 78 | Temp 98.0°F | Ht 70.0 in | Wt 190.0 lb

## 2022-10-12 DIAGNOSIS — N5089 Other specified disorders of the male genital organs: Secondary | ICD-10-CM | POA: Diagnosis not present

## 2022-10-12 DIAGNOSIS — M19012 Primary osteoarthritis, left shoulder: Secondary | ICD-10-CM | POA: Diagnosis not present

## 2022-10-12 DIAGNOSIS — M19011 Primary osteoarthritis, right shoulder: Secondary | ICD-10-CM | POA: Diagnosis not present

## 2022-10-12 NOTE — Patient Instructions (Signed)
[place] placed.  Nice meeting you today.

## 2022-10-12 NOTE — Progress Notes (Signed)
Established Patient Office Visit  Subjective:  Patient ID: Seth Doyle, male    DOB: 1956-01-15  Age: 67 y.o. MRN: [id number]  CC:  Chief Complaint  Patient presents with   Mass    Lump found on right side testicle. Slight pressure.    HPI  Seth Doyle presents for lump on the scrotum that he noticed 5 days ago.  He reports that the size of the lump is not increasing.  He reports some tenderness to touch.  He denies any difficulty urinating, moving or walking. HPI   Past Medical History:  Diagnosis Date   Actinic keratosis    Chronic insomnia 03/26/2014   Managed with generic ambien.      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 no longer on medicines.   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  last one approx 6 mos ago   Wears contact lenses     Past Surgical History:  Procedure Laterality Date   COLON SURGERY  August 2012   sigmoid colectomy, diverticulitis   COLONOSCOPY WITH PROPOFOL N/A 05/06/2015   Procedure: COLONOSCOPY WITH PROPOFOL;  Surgeon: Midge Minium, MD;  Location: [facility];  Service: Endoscopy;  Laterality: N/A;   COLONOSCOPY WITH PROPOFOL N/A 08/07/2019   Procedure: COLONOSCOPY WITH PROPOFOL;  Surgeon: Midge Minium, MD;  Location: [facility];  Service: Endoscopy;  Laterality: N/A;   EYE SURGERY     tear duct repair    POLYPECTOMY  05/06/2015   Procedure: POLYPECTOMY;  Surgeon: Midge Minium, MD;  Location: [facility];  Service: Endoscopy;;   RHINOPLASTY  1992   deviated  septum due to volleyball   RHINOPLASTY  1992   foe deviated septm, traumatic  [place]    ROTATOR CUFF REPAIR  1991   Dr. Ernest Pine   SHOULDER ARTHROSCOPY W/ ROTATOR CUFF REPAIR  1991   [facility]    Family History  Problem Relation Age of Onset   Diabetes Mother    Mental illness Mother        Dementia   COPD Father    Hyperlipidemia Father    Mental illness Father        Parkinson's Disease    Social History   Socioeconomic History   Marital status: Married    Spouse name: Not on file   Number of children: Not on file   Years of education: Not on file   Highest education level: Associate degree: occupational, Scientist, product/process development, or vocational program  Occupational History   Not on file  Tobacco Use   Smoking status: Never   Smokeless tobacco: Never  Vaping Use   Vaping Use: Never used  Substance and Sexual Activity   Alcohol use: Yes    Comment: rare - once /year   Drug use: No   Sexual activity: Yes  Other Topics Concern   Not on file  Social History Narrative   Not on file   Social Determinants of Health   Financial Resource Strain: Medium Risk (10/12/2022)   Overall Financial Resource Strain (CARDIA)  Difficulty of Paying Living Expenses: Somewhat hard  Food Insecurity: No Food Insecurity (10/12/2022)   Hunger Vital Sign    Worried About Running Out of Food in the Last Year: Never true    Ran Out of Food in the Last Year: Never true  Transportation Needs: No Transportation Needs (10/12/2022)   PRAPARE - Administrator, Civil Service (Medical): No    Lack of Transportation (Non-Medical): No  Physical Activity: Sufficiently Active (10/12/2022)   Exercise Vital Sign    Days of Exercise per Week: 5 days    Minutes of Exercise per Session: 40 min  Stress: No Stress Concern Present (10/12/2022)   [employer] of Occupational Health - Occupational Stress Questionnaire    Feeling of Stress : Only a little  Social Connections: Socially Integrated (10/12/2022)    Social Connection and Isolation Panel [NHANES]    Frequency of Communication with Friends and Family: More than three times a week    Frequency of Social Gatherings with Friends and Family: Twice a week    Attends Religious Services: More than 4 times per year    Active Member of [employer] or Organizations: Yes    Attends Engineer, structural: More than 4 times per year    Marital Status: Married  Catering manager Violence: Not on file     Outpatient Medications Prior to Visit  Medication Sig Dispense Refill   Ascorbic Acid (VITAMIN C) 100 MG tablet Take 100 mg by mouth daily.     clobetasol cream (TEMOVATE) 0.05 % Apply twice daily to affected areas on body as needed for rash/itching. Avoid applying to face, groin, and axilla. 30 g 2   hydrOXYzine (ATARAX) 25 MG tablet Take 1 tablet at bedtime as needed for itching. 30 tablet 3   meloxicam (MOBIC) 15 MG tablet Take 1 tablet every day by oral route for 30 days.     zolpidem (AMBIEN) 10 MG tablet TAKE 1/2 TABLET BY MOUTH EVERY NIGHT AT BEDTIME AS NEEDED FOR SLEEP 30 tablet 5   amoxicillin-clavulanate (AUGMENTIN) 875-125 MG tablet Take 1 tablet by mouth 2 (two) times daily. 14 tablet 0   clobetasol (TEMOVATE) 0.05 % external solution Apply topically as needed. MIX WITH A JAR OF CERAVE CREAM AND  APPLY TO AA 1-2 TIMES A DAY AS NEEDED FOR ITCHY RASH. 50 mL 1   ergocalciferol (DRISDOL) 1.25 MG (50000 UT) capsule Take 1 capsule (50,000 Units total) by mouth once a week. 12 capsule 0   Multiple Vitamin (MULTIVITAMIN) tablet Take 1 tablet by mouth daily.     No facility-administered medications prior to visit.    Allergies  Allergen Reactions   Wellbutrin [Bupropion] Itching    ROS Review of Systems  Constitutional: Negative.   HENT: Negative.    Eyes: Negative.   Respiratory: Negative.    Genitourinary:        Lump in the scrotum      Objective:    Physical Exam Constitutional:      Appearance: Normal appearance.  HENT:      Head: Normocephalic.  Cardiovascular:     Rate and Rhythm: Normal rate and regular rhythm.     Pulses: Normal pulses.     Heart sounds: Normal heart sounds.  Pulmonary:     Effort: Pulmonary effort is normal.     Breath sounds: Normal breath sounds. No stridor. No wheezing.  Abdominal:     General: Bowel sounds are normal.     Palpations: Abdomen  is soft. There is no mass.     Tenderness: There is no abdominal tenderness.     Hernia: There is no hernia in the left inguinal area or right inguinal area.  Genitourinary:    Comments: small mobile lump to right scrotum Musculoskeletal:        General: No swelling.     Cervical back: Normal range of motion and neck supple.     Right lower leg: No edema.     Left lower leg: No edema.  Skin:    General: Skin is warm.     Findings: No rash.  Neurological:     General: No focal deficit present.     Mental Status: He is alert and oriented to person, place, and time. Mental status is at baseline.  Psychiatric:        Mood and Affect: Mood normal.        Behavior: Behavior normal.        Thought Content: Thought content normal.        Judgment: Judgment normal.     BP 124/72 (BP Location: Right Arm)   Pulse 78   Temp 98 F (36.7 C) (Temporal)   Ht 5\' 10"  (1.778 m)   Wt 190 lb (86.2 kg)   SpO2 98%   BMI 27.26 kg/m  Wt Readings from Last 3 Encounters:  10/12/22 190 lb (86.2 kg)  05/22/22 193 lb (87.5 kg)  02/23/22 183 lb (83 kg)     Health Maintenance  Topic Date Due   HIV Screening  Never done   COVID-19 Vaccine (4 - 2023-24 season) 10/28/2022 (Originally 01/19/2022)   Pneumonia Vaccine 67+ Years old (1 of 1 - PCV) 10/12/2023 (Originally 07/31/2022)   INFLUENZA VACCINE  12/20/2022   Colonoscopy  08/06/2024   DTaP/Tdap/Td (3 - Td or Tdap) 07/11/2031   Hepatitis C Screening  Completed   Zoster Vaccines- Shingrix  Completed   HPV VACCINES  Aged Out    There are no preventive care reminders to display for this  patient.  Lab Results  Component Value Date   TSH 0.53 05/22/2022   Lab Results  Component Value Date   WBC 5.0 05/22/2022   HGB 14.7 05/22/2022   HCT 43.2 05/22/2022   MCV 88.8 05/22/2022   PLT 215.0 05/22/2022   Lab Results  Component Value Date   NA 137 05/22/2022   K 4.0 05/22/2022   CO2 24 05/22/2022   GLUCOSE 88 05/22/2022   BUN 30 (H) 05/22/2022   CREATININE 1.01 05/22/2022   BILITOT 0.5 05/22/2022   ALKPHOS 55 05/22/2022   AST 22 05/22/2022   ALT 24 05/22/2022   PROT 6.6 05/22/2022   ALBUMIN 4.3 05/22/2022   CALCIUM 9.3 05/22/2022   ANIONGAP 10 07/07/2017   GFR 78.43 05/22/2022   Lab Results  Component Value Date   CHOL 131 05/22/2022   Lab Results  Component Value Date   HDL 34.30 (L) 05/22/2022   Lab Results  Component Value Date   LDLCALC 73 05/22/2022   Lab Results  Component Value Date   TRIG 120.0 05/22/2022   Lab Results  Component Value Date   CHOLHDL 4 05/22/2022   Lab Results  Component Value Date   HGBA1C 5.6 05/22/2022      Assessment & Plan:  Lump in scrotum Assessment & Plan: Mobile lump to the right scrotum. Scrotum ultrasound ordered.  Orders: -     US SCROTUM W/DOPPLER    Follow-up: No follow-ups on  file.   Kara Dies, NP

## 2022-10-19 ENCOUNTER — Ambulatory Visit: Payer: BC Managed Care – PPO

## 2022-10-21 DIAGNOSIS — N5089 Other specified disorders of the male genital organs: Secondary | ICD-10-CM | POA: Insufficient documentation

## 2022-10-21 DIAGNOSIS — N433 Hydrocele, unspecified: Secondary | ICD-10-CM | POA: Insufficient documentation

## 2022-10-21 NOTE — Assessment & Plan Note (Signed)
Mobile lump to the right scrotum. Scrotum ultrasound ordered.

## 2022-10-25 ENCOUNTER — Ambulatory Visit
Admission: RE | Admit: 2022-10-25 | Discharge: 2022-10-25 | Disposition: A | Discharge status: Home / Self Care | Payer: BC Managed Care – PPO | Source: Ambulatory Visit | Attending: Nurse Practitioner | Admitting: Nurse Practitioner

## 2022-10-25 DIAGNOSIS — N5089 Other specified disorders of the male genital organs: Secondary | ICD-10-CM | POA: Diagnosis not present

## 2022-10-25 DIAGNOSIS — N433 Hydrocele, unspecified: Secondary | ICD-10-CM | POA: Diagnosis not present

## 2022-11-05 DIAGNOSIS — M25551 Pain in right hip: Secondary | ICD-10-CM | POA: Diagnosis not present

## 2022-11-09 ENCOUNTER — Other Ambulatory Visit: Payer: Self-pay | Admitting: Nurse Practitioner

## 2022-11-09 DIAGNOSIS — N433 Hydrocele, unspecified: Secondary | ICD-10-CM

## 2022-11-16 ENCOUNTER — Encounter: Payer: Self-pay | Admitting: Internal Medicine

## 2022-11-16 ENCOUNTER — Ambulatory Visit: Payer: BC Managed Care – PPO | Admitting: Internal Medicine

## 2022-11-16 VITALS — BP 116/72 | HR 75 | Temp 98.2°F | Ht 70.0 in | Wt 184.6 lb

## 2022-11-16 DIAGNOSIS — E278 Other specified disorders of adrenal gland: Secondary | ICD-10-CM

## 2022-11-16 DIAGNOSIS — E786 Lipoprotein deficiency: Secondary | ICD-10-CM

## 2022-11-16 DIAGNOSIS — N433 Hydrocele, unspecified: Secondary | ICD-10-CM

## 2022-11-16 DIAGNOSIS — E559 Vitamin D deficiency, unspecified: Secondary | ICD-10-CM

## 2022-11-16 DIAGNOSIS — M1611 Unilateral primary osteoarthritis, right hip: Secondary | ICD-10-CM

## 2022-11-16 DIAGNOSIS — G4709 Other insomnia: Secondary | ICD-10-CM

## 2022-11-16 DIAGNOSIS — F5104 Psychophysiologic insomnia: Secondary | ICD-10-CM

## 2022-11-16 DIAGNOSIS — E550 Rickets, active: Secondary | ICD-10-CM

## 2022-11-16 DIAGNOSIS — C439 Malignant melanoma of skin, unspecified: Secondary | ICD-10-CM | POA: Diagnosis not present

## 2022-11-16 DIAGNOSIS — Z87442 Personal history of urinary calculi: Secondary | ICD-10-CM

## 2022-11-16 MED ORDER — ZOLPIDEM TARTRATE 10 MG PO TABS: ORAL_TABLET | ORAL | 5 refills | Status: DC

## 2022-11-16 NOTE — Progress Notes (Unsigned)
Subjective:  Patient ID: Seth Doyle, male    DOB: [birth year]  Age: 67 y.o. MRN: [id number]  CC: There were no encounter diagnoses.   HPI Seth Doyle presents for  Chief Complaint  Patient presents with   Medical Management of Chronic Issues   1) OA hips:  s/p bilateral steroid injections last week,  using meloxicam.  2) overweight:  wt loss intentional  body fat 95   3)   Outpatient Medications Prior to Visit  Medication Sig Dispense Refill   Ascorbic Acid (VITAMIN C) 100 MG tablet Take 100 mg by mouth daily.     clobetasol cream (TEMOVATE) 0.05 % Apply twice daily to affected areas on body as needed for rash/itching. Avoid applying to face, groin, and axilla. 30 g 2   hydrOXYzine (ATARAX) 25 MG tablet Take 1 tablet at bedtime as needed for itching. 30 tablet 3   meloxicam (MOBIC) 15 MG tablet Take 1 tablet every day by oral route for 30 days.     zolpidem (AMBIEN) 10 MG tablet TAKE 1/2 TABLET BY MOUTH EVERY NIGHT AT BEDTIME AS NEEDED FOR SLEEP 30 tablet 5   No facility-administered medications prior to visit.    Review of Systems;  Patient denies headache, fevers, malaise, unintentional weight loss, skin rash, eye pain, sinus congestion and sinus pain, sore throat, dysphagia,  hemoptysis , cough, dyspnea, wheezing, chest pain, palpitations, orthopnea, edema, abdominal pain, nausea, melena, diarrhea, constipation, flank pain, dysuria, hematuria, urinary  Frequency, nocturia, numbness, tingling, seizures,  Focal weakness, Loss of consciousness,  Tremor, insomnia, depression, anxiety, and suicidal ideation.      Objective:  BP (!) 132/92   Pulse 75   Temp 98.2 F (36.8 C) (Oral)   Ht 5\' 10"  (1.778 m)   Wt 184 lb 9.6 oz (83.7 kg)   SpO2 94%   BMI 26.49 kg/m   BP Readings from Last 3 Encounters:  11/16/22 (!) 132/92  10/12/22 124/72  05/22/22 128/84    Wt Readings from Last 3 Encounters:  11/16/22 184 lb 9.6 oz (83.7 kg)  10/12/22 190 lb (86.2 kg)   05/22/22 193 lb (87.5 kg)    Physical Exam  Lab Results  Component Value Date   HGBA1C 5.6 05/22/2022   HGBA1C 5.7 09/20/2017   HGBA1C 5.7 01/06/2016    Lab Results  Component Value Date   CREATININE 1.01 05/22/2022   CREATININE 1.22 07/22/2020   CREATININE 1.38 07/11/2020    Lab Results  Component Value Date   WBC 5.0 05/22/2022   HGB 14.7 05/22/2022   HCT 43.2 05/22/2022   PLT 215.0 05/22/2022   GLUCOSE 88 05/22/2022   CHOL 131 05/22/2022   TRIG 120.0 05/22/2022   HDL 34.30 (L) 05/22/2022   LDLDIRECT 82.0 05/22/2022   LDLCALC 73 05/22/2022   ALT 24 05/22/2022   AST 22 05/22/2022   NA 137 05/22/2022   K 4.0 05/22/2022   CL 104 05/22/2022   CREATININE 1.01 05/22/2022   BUN 30 (H) 05/22/2022   CO2 24 05/22/2022   TSH 0.53 05/22/2022   PSA 0.85 05/22/2022   HGBA1C 5.6 05/22/2022   MICROALBUR <0.7 05/22/2022    US SCROTUM W/DOPPLER  Result Date: 10/28/2022 CLINICAL DATA:  Right scrotal lump for multiple weeks EXAM: SCROTAL ULTRASOUND DOPPLER ULTRASOUND OF THE TESTICLES TECHNIQUE: Complete ultrasound examination of the testicles, epididymis, and other scrotal structures was performed. Color and spectral Doppler ultrasound were also utilized to evaluate blood flow to the testicles. COMPARISON:  None Available. FINDINGS: Right testicle Measurements: 4.7 x 1.8 x 2.6 cm. No mass or microlithiasis visualized. Note is made of a 3 mm cyst within the testicle. Left testicle Measurements: 4.6 x 1.9 x 2.7 cm. No mass or microlithiasis visualized. Right epididymis:  Normal in size and appearance. Left epididymis:  Normal in size and appearance. Hydrocele:  Small bilateral hydroceles. Varicocele:  None visualized. Pulsed Doppler interrogation of both testes demonstrates normal low resistance arterial and venous waveforms bilaterally. IMPRESSION: 1. No acute process. 2. Small bilateral hydroceles. Electronically Signed   By: Annia Belt M.D.   On: 10/28/2022 22:22    Assessment &  Plan:  .There are no diagnoses linked to this encounter.   I provided 30 minutes of face-to-face time during this encounter reviewing patient's last visit with me, patient's  most recent visit with cardiology,  nephrology,  and neurology,  recent surgical and non surgical procedures, previous  labs and imaging studies, counseling on currently addressed issues,  and post visit ordering to diagnostics and therapeutics .   Follow-up: No follow-ups on file.   Sherlene Shams, MD

## 2022-11-16 NOTE — Assessment & Plan Note (Signed)
Scalp,  removed in June 2023 at [facility]

## 2022-11-16 NOTE — Patient Instructions (Signed)
You are doing well! !  Tell DonnA HELLO FOR ME!

## 2022-11-17 LAB — COMPREHENSIVE METABOLIC PANEL
AG Ratio: 1.9 (calc) (ref 1.0–2.5)
ALT: 18 U/L (ref 9–46)
AST: 24 U/L (ref 10–35)
Albumin: 4.6 g/dL (ref 3.6–5.1)
Alkaline phosphatase (APISO): 60 U/L (ref 35–144)
BUN: 23 mg/dL (ref 7–25)
CO2: 23 mmol/L (ref 20–32)
Calcium: 10.2 mg/dL (ref 8.6–10.3)
Chloride: 104 mmol/L (ref 98–110)
Creat: 1.17 mg/dL (ref 0.70–1.35)
Globulin: 2.4 g/dL (calc) (ref 1.9–3.7)
Glucose, Bld: 85 mg/dL (ref 65–99)
Potassium: 4.3 mmol/L (ref 3.5–5.3)
Sodium: 139 mmol/L (ref 135–146)
Total Bilirubin: 0.7 mg/dL (ref 0.2–1.2)
Total Protein: 7 g/dL (ref 6.1–8.1)

## 2022-11-17 LAB — VITAMIN D 25 HYDROXY (VIT D DEFICIENCY, FRACTURES): Vit D, 25-Hydroxy: 44 ng/mL (ref 30–100)

## 2022-11-17 LAB — LIPID PANEL
Cholesterol: 122 mg/dL (ref <200)
HDL: 45 mg/dL (ref >=40)
LDL Cholesterol (Calc): 63 mg/dL (calc)
Non-HDL Cholesterol (Calc): 77 mg/dL (calc) (ref <130)
Total CHOL/HDL Ratio: 2.7 (calc) (ref <5.0)
Triglycerides: 60 mg/dL (ref <150)

## 2022-11-18 NOTE — Assessment & Plan Note (Signed)
Small,  noted on June 2024 scrotal U/s

## 2022-11-18 NOTE — Assessment & Plan Note (Signed)
Resolved with supplementation.  Continue supplements given history of  melanoma

## 2022-11-18 NOTE — Assessment & Plan Note (Signed)
Previously managed with [place] with recent return of sympotms not responding  to  non pharmcologic methods of relaxation .  Ambine refilled

## 2022-11-18 NOTE — Assessment & Plan Note (Signed)
Recent CT abd pelvis noted a non obstructing  stone in  the right renal pelvis.

## 2022-11-18 NOTE — Assessment & Plan Note (Signed)
S/p bilateral I/S steroid injections  recently, with mild  to moderate relief

## 2022-11-18 NOTE — Assessment & Plan Note (Signed)
Stable right adenoma by 2024 CT abd pelvis

## 2022-11-21 ENCOUNTER — Encounter: Payer: Self-pay | Admitting: Urology

## 2022-11-21 ENCOUNTER — Ambulatory Visit: Payer: BC Managed Care – PPO | Admitting: Urology

## 2022-11-21 VITALS — BP 164/95 | HR 78 | Ht 70.5 in | Wt 186.0 lb

## 2022-11-21 DIAGNOSIS — Z87442 Personal history of urinary calculi: Secondary | ICD-10-CM

## 2022-11-21 DIAGNOSIS — N200 Calculus of kidney: Secondary | ICD-10-CM

## 2022-11-21 DIAGNOSIS — N432 Other hydrocele: Secondary | ICD-10-CM | POA: Diagnosis not present

## 2022-11-21 DIAGNOSIS — M25551 Pain in right hip: Secondary | ICD-10-CM | POA: Diagnosis not present

## 2022-11-21 DIAGNOSIS — Z125 Encounter for screening for malignant neoplasm of prostate: Secondary | ICD-10-CM | POA: Diagnosis not present

## 2022-11-21 DIAGNOSIS — M25552 Pain in left hip: Secondary | ICD-10-CM | POA: Diagnosis not present

## 2022-11-21 NOTE — Progress Notes (Signed)
11/21/22 11:40 AM   Seth Doyle 03-25-1956 [id number]  CC: Right scrotal mass, hydrocele, PSA screening, history of kidney stones  HPI: Healthy 67 year old male referred for the above issues.  He reportedly felt the mass in the right scrotum and PCP ordered a scrotal ultrasound.  This showed no worrisome findings with small bilateral physiologic hydroceles, and a benign 3 mm cyst within the right testicle, no worrisome masses.  He denies any scrotal pain.  PSA normal at 0.85 from January 2024 which has been stable over the last 10 years.  He has a history of a single spontaneously passed kidney stone, CT from 2021 shows a punctate right lower pole stone, no hydronephrosis.   PMH: Past Medical History:  Diagnosis Date   Actinic keratosis    Chronic insomnia 03/26/2014   Managed with generic ambien.      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 no longer on medicines.   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  last one approx 6 mos ago   Wears contact lenses     Surgical History: Past Surgical History:  Procedure Laterality Date   COLON SURGERY  August 2012   sigmoid colectomy, diverticulitis   COLONOSCOPY WITH PROPOFOL N/A 05/06/2015   Procedure: COLONOSCOPY WITH PROPOFOL;  Surgeon: Midge Minium, MD;  Location: [facility];  Service: Endoscopy;  Laterality: N/A;   COLONOSCOPY WITH PROPOFOL N/A 08/07/2019   Procedure: COLONOSCOPY WITH PROPOFOL;  Surgeon: Midge Minium, MD;  Location: [facility];  Service:  Endoscopy;  Laterality: N/A;   EYE SURGERY     tear duct repair    POLYPECTOMY  05/06/2015   Procedure: POLYPECTOMY;  Surgeon: Midge Minium, MD;  Location: [facility];  Service: Endoscopy;;   RHINOPLASTY  1992   deviated septum due to volleyball   RHINOPLASTY  1992   foe deviated septm, traumatic  [facility]    ROTATOR CUFF REPAIR  1991   Dr. Ernest Pine   SHOULDER ARTHROSCOPY W/ ROTATOR CUFF REPAIR  1991   [facility]ooten    Family History: Family History  Problem Relation Age of Onset   Diabetes Mother    Mental illness Mother        Dementia   COPD Father    Hyperlipidemia Father    Mental illness Father        Parkinson's Disease    Social History:  reports that he has never smoked. He has never been exposed to tobacco smoke. He has never used smokeless tobacco. He reports current alcohol use. He reports that he does not use drugs.  Physical Exam: BP (!) 164/95   Pulse 78   Ht 5' 10.5" (1.791 m)   Wt 186 lb (84.4 kg)   BMI 26.31 kg/m    Constitutional:  Alert and oriented, No acute distress. Cardiovascular: No clubbing, cyanosis, or edema. Respiratory: Normal respiratory effort, no increased work of breathing. GI: Abdomen is soft, nontender, nondistended, no abdominal masses GU: Testicles 20 cc and descended bilaterally without masses, minimal physiologic hydroceles, no worrisome findings, no tenderness  Laboratory Data: See HPI  Pertinent Imaging: I have personally viewed and interpreted the recent scrotal ultrasound with no worrisome findings, bilateral small physiologic hydroceles, as well as the CT from 2021 showing no hydronephrosis and a punctate right lower pole nonobstructive stone.  Assessment & Plan:   67 year old male here with a number of urologic questions including bilateral small physiologic hydroceles on scrotal ultrasound, PSA screening, and history of kidney stones.  Reassurance provided regarding essentially normal scrotal ultrasound, exam benign, no  further imaging or follow-up needed.  Reassurance provided regarding normal PSA value, and benign PSA trend over the last 10 years, can continue screening every other year through age 58 per guideline recommendations.  We discussed general stone prevention strategies including adequate hydration with goal of producing 2.5 L of urine daily, increasing citric acid intake, increasing calcium intake during high oxalate meals, minimizing animal protein, and decreasing salt intake. Information about dietary recommendations given today.   Follow-up with urology as needed   Legrand Rams, MD 11/21/2022  [facility] [address] [place], [place] [address] [phone]

## 2022-12-04 ENCOUNTER — Encounter: Payer: Self-pay | Admitting: Internal Medicine

## 2022-12-05 NOTE — Telephone Encounter (Signed)
 LMTCB

## 2022-12-05 NOTE — Telephone Encounter (Signed)
Spoke with pt and scheduled him for a virtual appt tomorrow.

## 2022-12-05 NOTE — Telephone Encounter (Signed)
Patient returned office phone call. 

## 2022-12-06 ENCOUNTER — Encounter: Payer: Self-pay | Admitting: Nurse Practitioner

## 2022-12-06 ENCOUNTER — Telehealth: Payer: BC Managed Care – PPO | Admitting: Nurse Practitioner

## 2022-12-06 VITALS — Ht 71.0 in | Wt 179.0 lb

## 2022-12-06 DIAGNOSIS — U071 COVID-19: Secondary | ICD-10-CM

## 2022-12-06 MED ORDER — HYDROCOD POLI-CHLORPHE POLI ER 10-8 MG/5ML PO SUER: 5.0000 mL | Freq: Every evening | ORAL | 0 refills | Status: DC | PRN

## 2022-12-06 MED ORDER — PREDNISONE 10 MG PO TABS: ORAL_TABLET | ORAL | 0 refills | Status: DC

## 2022-12-06 NOTE — Progress Notes (Signed)
Virtual Visit via Video Note  I connected with Seth Doyle on 12/06/2022 at 11:37 AM by a video enabled telemedicine application and verified that I am speaking with the correct person using two identifiers.  Patient Location: Home Provider Location: Office/Clinic  I discussed the limitations, risks, security, and privacy concerns of performing an evaluation and management service by video and the availability of in person appointments. I also discussed with the patient that there may be a patient responsible charge related to this service. The patient expressed understanding and agreed to proceed.  Subjective: PCP: Sherlene Shams, MD  Chief Complaint  Patient presents with   Covid Positive   HPI  Patient tested positive for COVID 2 days ago.  The symptoms started 3 days ago had 100.for fever, tired cough with sputum production, headaches and body ache, No fever now still have headache and bodyaches and some nasal congestion. Denies any shortness of breath or chest pain. Medication use: Sudafed   ROS: Per HPI  Current Outpatient Medications:    Ascorbic Acid (VITAMIN C) 100 MG tablet, Take 100 mg by mouth daily., Disp: , Rfl:    chlorpheniramine-HYDROcodone (TUSSIONEX) 10-8 MG/5ML, Take 5 mLs by mouth at bedtime as needed for cough., Disp: 70 mL, Rfl: 0   clobetasol cream (TEMOVATE) 0.05 %, Apply twice daily to affected areas on body as needed for rash/itching. Avoid applying to face, groin, and axilla., Disp: 30 g, Rfl: 2   hydrOXYzine (ATARAX) 25 MG tablet, Take 1 tablet at bedtime as needed for itching., Disp: 30 tablet, Rfl: 3   meloxicam (MOBIC) 15 MG tablet, Take 1 tablet every day by oral route for 30 days., Disp: , Rfl:    predniSONE (DELTASONE) 10 MG tablet, Take 4 tablets ( total 40 mg) by mouth for 2 days; take 3 tablets ( total 30 mg) by mouth for 2 days; take 2 tablets ( total 20 mg) by mouth for 1 day; take 1 tablet ( total 10 mg) by mouth for 1 day., Disp: 17  tablet, Rfl: 0   zolpidem (AMBIEN) 10 MG tablet, TAKE 1/2 TABLET BY MOUTH EVERY NIGHT AT BEDTIME AS NEEDED FOR SLEEPTAKE 1/2 TABLET BY MOUTH EVERY NIGHT AT BEDTIME AS NEEDED FOR SLEEP, Disp: 30 tablet, Rfl: 5  Observations/Objective: Today's Vitals   12/06/22 1122  Weight: 179 lb (81.2 kg)  Height: 5\' 11"  (1.803 m)   Physical Exam Constitutional:      Appearance: Normal appearance.  Eyes:     Conjunctiva/sclera: Conjunctivae normal.  Pulmonary:     Effort: No respiratory distress.  Neurological:     Mental Status: He is alert.  Psychiatric:        Mood and Affect: Mood normal.        Behavior: Behavior normal.        Thought Content: Thought content normal.        Judgment: Judgment normal.     Assessment and Plan: COVID-19 Assessment & Plan: Discussed antibiotic treatment, patient politely declined. Symptomatic treatment discussed. Will treat with prednisone and Tussionex. Increase fluid intake, use salt water gargles and rest. Red flag discussed.  Patient was provided clear instructions to go to ER or urgent care if symptoms does not improve, red flag or new problem develops.  Patient verbalized understanding.    Other orders -     predniSONE; Take 4 tablets ( total 40 mg) by mouth for 2 days; take 3 tablets ( total 30 mg) by mouth for 2  days; take 2 tablets ( total 20 mg) by mouth for 1 day; take 1 tablet ( total 10 mg) by mouth for 1 day.  Dispense: 17 tablet; Refill: 0 -     Hydrocod Poli-Chlorphe Poli ER; Take 5 mLs by mouth at bedtime as needed for cough.  Dispense: 70 mL; Refill: 0    Follow Up Instructions: No follow-ups on file.   I discussed the assessment and treatment plan with the patient. The patient was provided an opportunity to ask questions, and all were answered. The patient agreed with the plan and demonstrated an understanding of the instructions.   The patient was advised to call back or seek an in-person evaluation if the symptoms worsen or if  the condition fails to improve as anticipated.  The above assessment and management plan was discussed with the patient. The patient verbalized understanding of and has agreed to the management plan.   Kara Dies, NP

## 2022-12-07 DIAGNOSIS — M19012 Primary osteoarthritis, left shoulder: Secondary | ICD-10-CM | POA: Diagnosis not present

## 2022-12-07 DIAGNOSIS — M19011 Primary osteoarthritis, right shoulder: Secondary | ICD-10-CM | POA: Diagnosis not present

## 2022-12-15 NOTE — Assessment & Plan Note (Addendum)
Discussed antibiotic treatment, patient politely declined. Symptomatic treatment discussed. Will treat with prednisone and Tussionex. Increase fluid intake, use salt water gargles and rest. Red flag discussed.  Patient was provided clear instructions to go to ER or urgent care if symptoms does not improve, red flag or new problem develops.  Patient verbalized understanding.

## 2022-12-20 ENCOUNTER — Encounter: Payer: Self-pay | Admitting: Internal Medicine

## 2022-12-21 ENCOUNTER — Ambulatory Visit: Payer: BC Managed Care – PPO | Admitting: Nurse Practitioner

## 2022-12-21 ENCOUNTER — Encounter: Payer: Self-pay | Admitting: Nurse Practitioner

## 2022-12-21 VITALS — BP 130/82 | HR 77 | Temp 97.9°F | Resp 16 | Ht 70.0 in | Wt 179.4 lb

## 2022-12-21 DIAGNOSIS — M5442 Lumbago with sciatica, left side: Secondary | ICD-10-CM | POA: Diagnosis not present

## 2022-12-21 MED ORDER — CYCLOBENZAPRINE HCL 10 MG PO TABS: 10.0000 mg | ORAL_TABLET | Freq: Three times a day (TID) | ORAL | 0 refills | Status: DC | PRN

## 2022-12-21 MED ORDER — PREDNISONE 10 MG PO TABS
ORAL_TABLET | ORAL | 0 refills | Status: DC
Start: 2022-12-21 — End: 2023-08-02

## 2022-12-21 MED ORDER — GABAPENTIN 100 MG PO CAPS: 100.0000 mg | ORAL_CAPSULE | Freq: Two times a day (BID) | ORAL | 1 refills | Status: DC

## 2022-12-21 NOTE — Telephone Encounter (Signed)
Pt called in to make an appt for his back pain. Pt its book to see Evelene Croon this afternoon 12/21/22.

## 2022-12-21 NOTE — Progress Notes (Unsigned)
Established Patient Office Visit  Subjective:  Patient ID: Seth Doyle, male    DOB: 11/06/1955  Age: 67 y.o. MRN: [id number]  CC:  Chief Complaint  Patient presents with   Back Pain    Lower back goes to left leg. X 10 days no falls no urinary symptoms.    HPI  Seth Doyle presents for  Back Pain This is a new problem. Episode onset: 10 days. The problem occurs constantly. The problem is unchanged. The pain is present in the lumbar spine. The quality of the pain is described as shooting. The pain radiates to the left foot. The pain is at a severity of 9/10. The pain is severe. The pain is The same all the time. The symptoms are aggravated by bending, twisting, standing and position. Pertinent negatives include no abdominal pain, chest pain or pelvic pain.     Past Medical History:  Diagnosis Date   Actinic keratosis    Chronic insomnia 03/26/2014   Managed with generic ambien.      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 no longer on medicines.   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  last one approx 6 mos ago   Wears contact lenses     Past Surgical History:  Procedure Laterality Date   COLON SURGERY  August 2012   sigmoid colectomy, diverticulitis   COLONOSCOPY WITH PROPOFOL N/A 05/06/2015   Procedure: COLONOSCOPY WITH PROPOFOL;  Surgeon: Midge Minium, MD;  Location: [facility];  Service: Endoscopy;  Laterality: N/A;   COLONOSCOPY WITH PROPOFOL N/A 08/07/2019   Procedure: COLONOSCOPY WITH  PROPOFOL;  Surgeon: Midge Minium, MD;  Location: [facility] END[facility];  Service: Endoscopy;  Laterality: N/A;   EYE SURGERY     tear duct repair    POLYPECTOMY  05/06/2015   Procedure: POLYPECTOMY;  Surgeon: Midge Minium, MD;  Location: [facility];  Service: Endoscopy;;   RHINOPLASTY  1992   deviated septum due to volleyball   RHINOPLASTY  1992   foe deviated septm, traumatic  [facility]    ROTATOR CUFF REPAIR  1991   Dr. Ernest Pine   SHOULDER ARTHROSCOPY W/ ROTATOR CUFF REPAIR  1991   [facility]    Family History  Problem Relation Age of Onset   Diabetes Mother    Mental illness Mother        Dementia   COPD Father    Hyperlipidemia Father    Mental illness Father        Parkinson's Disease    Social History   Socioeconomic History   Marital status: Married    Spouse name: Not on file   Number of children: Not on file   Years of education: Not on file   Highest education level: Associate degree: occupational, Scientist, product/process development, or vocational program  Occupational History   Not on file  Tobacco Use   Smoking status: Never    Passive exposure: Never   Smokeless tobacco: Never  Vaping Use   Vaping status: Never Used  Substance and Sexual Activity   Alcohol  use: Yes    Comment: rare - once /year   Drug use: No   Sexual activity: Yes  Other Topics Concern   Not on file  Social History Narrative   Not on file   Social Determinants of Health   Financial Resource Strain: Medium Risk (10/12/2022)   Overall Financial Resource Strain (CARDIA)    Difficulty of Paying Living Expenses: Somewhat hard  Food Insecurity: No Food Insecurity (10/12/2022)   Hunger Vital Sign    Worried About Running Out of Food in the Last Year: Never true    Ran Out of Food in the Last Year: Never true  Transportation Needs: No Transportation Needs (10/12/2022)   PRAPARE - Administrator, Civil Service (Medical): No    Lack of Transportation (Non-Medical): No  Physical Activity: Sufficiently  Active (10/12/2022)   Exercise Vital Sign    Days of Exercise per Week: 5 days    Minutes of Exercise per Session: 40 min  Stress: No Stress Concern Present (10/12/2022)   [employer] of Occupational Health - Occupational Stress Questionnaire    Feeling of Stress : Only a little  Social Connections: Socially Integrated (10/12/2022)   Social Connection and Isolation Panel [NHANES]    Frequency of Communication with Friends and Family: More than three times a week    Frequency of Social Gatherings with Friends and Family: Twice a week    Attends Religious Services: More than 4 times per year    Active Member of [employer] or Organizations: Yes    Attends Engineer, structural: More than 4 times per year    Marital Status: Married  Catering manager Violence: Not on file     Outpatient Medications Prior to Visit  Medication Sig Dispense Refill   Ascorbic Acid (VITAMIN C) 100 MG tablet Take 100 mg by mouth daily.     chlorpheniramine-HYDROcodone (TUSSIONEX) 10-8 MG/5ML Take 5 mLs by mouth at bedtime as needed for cough. 70 mL 0   clobetasol cream (TEMOVATE) 0.05 % Apply twice daily to affected areas on body as needed for rash/itching. Avoid applying to face, groin, and axilla. 30 g 2   hydrOXYzine (ATARAX) 25 MG tablet Take 1 tablet at bedtime as needed for itching. 30 tablet 3   meloxicam (MOBIC) 15 MG tablet Take 1 tablet every day by oral route for 30 days.     predniSONE (DELTASONE) 10 MG tablet Take 4 tablets ( total 40 mg) by mouth for 2 days; take 3 tablets ( total 30 mg) by mouth for 2 days; take 2 tablets ( total 20 mg) by mouth for 1 day; take 1 tablet ( total 10 mg) by mouth for 1 day. 17 tablet 0   zolpidem (AMBIEN) 10 MG tablet TAKE 1/2 TABLET BY MOUTH EVERY NIGHT AT BEDTIME AS NEEDED FOR SLEEPTAKE 1/2 TABLET BY MOUTH EVERY NIGHT AT BEDTIME AS NEEDED FOR SLEEP 30 tablet 5   No facility-administered medications prior to visit.    Allergies  Allergen Reactions    Wellbutrin [Bupropion] Itching    ROS Review of Systems  Cardiovascular:  Negative for chest pain.  Gastrointestinal:  Negative for abdominal pain.  Genitourinary:  Negative for pelvic pain.  Musculoskeletal:  Positive for back pain.   Negative unless indicated in HPI.    Objective:    Physical Exam  BP 130/82   Pulse 77   Temp 97.9 F (36.6 C)   Resp 16   Ht 5\' 10"  (1.778 m)  Wt 179 lb 6 oz (81.4 kg)   SpO2 97%   BMI 25.74 kg/m  Wt Readings from Last 3 Encounters:  12/21/22 179 lb 6 oz (81.4 kg)  12/06/22 179 lb (81.2 kg)  11/21/22 186 lb (84.4 kg)     Health Maintenance  Topic Date Due   HIV Screening  Never done   COVID-19 Vaccine (4 - 2023-24 season) 01/19/2022   INFLUENZA VACCINE  08/19/2023 (Originally 12/20/2022)   Pneumonia Vaccine 73+ Years old (1 of 1 - PCV) 10/12/2023 (Originally 07/31/2022)   Colonoscopy  08/06/2024   DTaP/Tdap/Td (3 - Td or Tdap) 07/11/2031   Hepatitis C Screening  Completed   Zoster Vaccines- Shingrix  Completed   HPV VACCINES  Aged Out    There are no preventive care reminders to display for this patient.  Lab Results  Component Value Date   TSH 0.53 05/22/2022   Lab Results  Component Value Date   WBC 5.0 05/22/2022   HGB 14.7 05/22/2022   HCT 43.2 05/22/2022   MCV 88.8 05/22/2022   PLT 215.0 05/22/2022   Lab Results  Component Value Date   NA 139 11/16/2022   K 4.3 11/16/2022   CO2 23 11/16/2022   GLUCOSE 85 11/16/2022   BUN 23 11/16/2022   CREATININE 1.17 11/16/2022   BILITOT 0.7 11/16/2022   ALKPHOS 55 05/22/2022   AST 24 11/16/2022   ALT 18 11/16/2022   PROT 7.0 11/16/2022   ALBUMIN 4.3 05/22/2022   CALCIUM 10.2 11/16/2022   ANIONGAP 10 07/07/2017   GFR 78.43 05/22/2022   Lab Results  Component Value Date   CHOL 122 11/16/2022   Lab Results  Component Value Date   HDL 45 11/16/2022   Lab Results  Component Value Date   LDLCALC 63 11/16/2022   Lab Results  Component Value Date   TRIG 60  11/16/2022   Lab Results  Component Value Date   CHOLHDL 2.7 11/16/2022   Lab Results  Component Value Date   HGBA1C 5.6 05/22/2022      Assessment & Plan:  There are no diagnoses linked to this encounter.  Follow-up: No follow-ups on file.   Kara Dies, NP

## 2022-12-23 DIAGNOSIS — M5442 Lumbago with sciatica, left side: Secondary | ICD-10-CM | POA: Insufficient documentation

## 2022-12-23 DIAGNOSIS — M48061 Spinal stenosis, lumbar region without neurogenic claudication: Secondary | ICD-10-CM | POA: Insufficient documentation

## 2022-12-23 NOTE — Assessment & Plan Note (Addendum)
Left straight leg raise positive. Will treat with prednisone tapering, flexeril and gabapentin. The medication can cause drowsiness advised patient to avoid driving or operating heavy machinery while taking the medication. Advised to alternate hot and cold pack. If symptoms not improving would consider Ortho referral.

## 2022-12-26 DIAGNOSIS — M5459 Other low back pain: Secondary | ICD-10-CM | POA: Diagnosis not present

## 2022-12-27 DIAGNOSIS — M5416 Radiculopathy, lumbar region: Secondary | ICD-10-CM | POA: Diagnosis not present

## 2023-01-05 DIAGNOSIS — M5416 Radiculopathy, lumbar region: Secondary | ICD-10-CM | POA: Diagnosis not present

## 2023-01-18 ENCOUNTER — Other Ambulatory Visit: Payer: Self-pay | Admitting: Nurse Practitioner

## 2023-01-24 DIAGNOSIS — M5416 Radiculopathy, lumbar region: Secondary | ICD-10-CM | POA: Diagnosis not present

## 2023-02-13 DIAGNOSIS — M25551 Pain in right hip: Secondary | ICD-10-CM | POA: Diagnosis not present

## 2023-02-13 DIAGNOSIS — M25552 Pain in left hip: Secondary | ICD-10-CM | POA: Diagnosis not present

## 2023-02-16 DIAGNOSIS — M5416 Radiculopathy, lumbar region: Secondary | ICD-10-CM | POA: Diagnosis not present

## 2023-03-13 DIAGNOSIS — M5451 Vertebrogenic low back pain: Secondary | ICD-10-CM | POA: Diagnosis not present

## 2023-04-02 ENCOUNTER — Ambulatory Visit: Payer: BC Managed Care – PPO | Admitting: Dermatology

## 2023-04-02 DIAGNOSIS — W908XXA Exposure to other nonionizing radiation, initial encounter: Secondary | ICD-10-CM | POA: Diagnosis not present

## 2023-04-02 DIAGNOSIS — L820 Inflamed seborrheic keratosis: Secondary | ICD-10-CM

## 2023-04-02 DIAGNOSIS — L578 Other skin changes due to chronic exposure to nonionizing radiation: Secondary | ICD-10-CM

## 2023-04-02 DIAGNOSIS — D1801 Hemangioma of skin and subcutaneous tissue: Secondary | ICD-10-CM

## 2023-04-02 DIAGNOSIS — Z86006 Personal history of melanoma in-situ: Secondary | ICD-10-CM

## 2023-04-02 DIAGNOSIS — D23111 Other benign neoplasm of skin of right upper eyelid, including canthus: Secondary | ICD-10-CM

## 2023-04-02 DIAGNOSIS — D2310 Other benign neoplasm of skin of unspecified eyelid, including canthus: Secondary | ICD-10-CM

## 2023-04-02 DIAGNOSIS — L853 Xerosis cutis: Secondary | ICD-10-CM | POA: Diagnosis not present

## 2023-04-02 DIAGNOSIS — L309 Dermatitis, unspecified: Secondary | ICD-10-CM

## 2023-04-02 DIAGNOSIS — Z1283 Encounter for screening for malignant neoplasm of skin: Secondary | ICD-10-CM | POA: Diagnosis not present

## 2023-04-02 DIAGNOSIS — D2239 Melanocytic nevi of other parts of face: Secondary | ICD-10-CM

## 2023-04-02 DIAGNOSIS — D229 Melanocytic nevi, unspecified: Secondary | ICD-10-CM

## 2023-04-02 DIAGNOSIS — L570 Actinic keratosis: Secondary | ICD-10-CM | POA: Diagnosis not present

## 2023-04-02 DIAGNOSIS — L821 Other seborrheic keratosis: Secondary | ICD-10-CM

## 2023-04-02 DIAGNOSIS — L814 Other melanin hyperpigmentation: Secondary | ICD-10-CM

## 2023-04-02 MED ORDER — HYDROXYZINE HCL 25 MG PO TABS: ORAL_TABLET | ORAL | 3 refills | Status: AC

## 2023-04-02 NOTE — Progress Notes (Signed)
Follow-Up Visit   Subjective  Seth Doyle is a 67 y.o. male who presents for the following: Skin Cancer Screening and Upper Body Skin Exam  The patient presents for Upper Body Skin Exam (UBSE) for skin cancer screening and mole check. The patient has spots, moles and lesions to be evaluated, some may be new or changing and the patient may have concern these could be cancer.  He has an irritated spot on R forearm.  Hx of MMis. Hx of dermatitis at neck and arms. Patient uses clobetasol/CeraVe prn flares and takes hydroxyzine as needed. Would like refills for hydroxyzine.   The following portions of the chart were reviewed this encounter and updated as appropriate: medications, allergies, medical history  Review of Systems:  No other skin or systemic complaints except as noted in HPI or Assessment and Plan.  Objective  Well appearing patient in no apparent distress; mood and affect are within normal limits.  All skin waist up examined. Relevant physical exam findings are noted in the Assessment and Plan.  Scalp (6) Erythematous thin papules/macules with gritty scale.   Right Forearm Erythematous stuck-on, waxy papule or plaque    Assessment & Plan   AK (actinic keratosis) (6) Scalp  Actinic keratoses are precancerous spots that appear secondary to cumulative UV radiation exposure/sun exposure over time. They are chronic with expected duration over 1 year. A portion of actinic keratoses will progress to squamous cell carcinoma of the skin. It is not possible to reliably predict which spots will progress to skin cancer and so treatment is recommended to prevent development of skin cancer.  Recommend daily broad spectrum sunscreen SPF 30+ to sun-exposed areas, reapply every 2 hours as needed.  Recommend staying in the shade or wearing long sleeves, sun glasses (UVA+UVB protection) and wide brim hats (4-inch brim around the entire circumference of the hat). Call for new or  changing lesions.  Destruction of lesion - Scalp (6)  Destruction method: cryotherapy   Informed consent: discussed and consent obtained   Lesion destroyed using liquid nitrogen: Yes   Region frozen until ice ball extended beyond lesion: Yes   Outcome: patient tolerated procedure well with no complications   Post-procedure details: wound care instructions given   Additional details:  Prior to procedure, discussed risks of blister formation, small wound, skin dyspigmentation, or rare scar following cryotherapy. Recommend Vaseline ointment to treated areas while healing.   Inflamed seborrheic keratosis Right Forearm  Symptomatic, irritating, patient would like treated.  Benign-appearing.  Call clinic for new or changing lesions.    Destruction of lesion - Right Forearm  Destruction method: cryotherapy   Informed consent: discussed and consent obtained   Lesion destroyed using liquid nitrogen: Yes   Region frozen until ice ball extended beyond lesion: Yes   Outcome: patient tolerated procedure well with no complications   Post-procedure details: wound care instructions given   Additional details:  Prior to procedure, discussed risks of blister formation, small wound, skin dyspigmentation, or rare scar following cryotherapy. Recommend Vaseline ointment to treated areas while healing.   Xerosis cutis  Related Medications hydrOXYzine (ATARAX) 25 MG tablet Take 1 tablet at bedtime as needed for itching.   Skin cancer screening performed today.  Actinic Damage - Chronic condition, secondary to cumulative UV/sun exposure - diffuse scaly erythematous macules with underlying dyspigmentation - Recommend daily broad spectrum sunscreen SPF 30+ to sun-exposed areas, reapply every 2 hours as needed.  - Staying in the shade or wearing long sleeves,  sun glasses (UVA+UVB protection) and wide brim hats (4-inch brim around the entire circumference of the hat) are also recommended for sun  protection.  - Call for new or changing lesions.  Lentigines, Seborrheic Keratoses, Hemangiomas - Benign normal skin lesions - Benign-appearing - Call for any changes  Melanocytic Nevi - Tan-brown and/or pink-flesh-colored symmetric macules and papules - Benign appearing on exam today - Observation - Call clinic for new or changing moles - Recommend daily use of broad spectrum spf 30+ sunscreen to sun-exposed areas.  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 - No evidence of recurrence today of the right upper forehead - Recommend regular full body skin exams - Recommend daily broad spectrum sunscreen SPF 30+ to sun-exposed areas, reapply every 2 hours as needed.  - Call if any new or changing lesions are noted between office visits  Dermatitis Exam: Clear today, pt states itchy rash comes and goes   Chronic condition with duration or expected duration over one year. Currently well-controlled with topical treatment as needed for flares.    Treatment Plan: Continue Hydroxyzine 25mg  1 tablet at bedtime as needed for itching dsp #30 3Rf. Continue Clobetasol/CeraVe cream mix to aas body qd/bid prn itchy rash, avoid f/g/a. Continue Clobetasol Cream QD/BID prn itch to more severe areas. Caution skin atrophy with long-term use.    Topical steroids (such as triamcinolone, fluocinolone, fluocinonide, mometasone, clobetasol, halobetasol, betamethasone, hydrocortisone) can cause thinning and lightening of the skin if they are used for too long in the same area. Your physician has selected the right strength medicine for your problem and area affected on the body. Please use your medication only as directed by your physician to prevent side effects.   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  Observation.  Stable. Call clinic for new or changing lesions.  Recommend daily use of broad spectrum  spf 30+ sunscreen to sun-exposed areas.   Return in about 6 months (around 09/30/2023) for UBSE, Hx MMis, Hx AK.  Anise Salvo, RMA, am acting as scribe for Willeen Niece, MD .   Documentation: I have reviewed the above documentation for accuracy and completeness, and I agree with the above.  Willeen Niece, MD

## 2023-04-02 NOTE — Patient Instructions (Addendum)

## 2023-05-01 DIAGNOSIS — M25552 Pain in left hip: Secondary | ICD-10-CM | POA: Diagnosis not present

## 2023-05-01 DIAGNOSIS — M25551 Pain in right hip: Secondary | ICD-10-CM | POA: Diagnosis not present

## 2023-05-06 ENCOUNTER — Encounter: Payer: Self-pay | Admitting: Physician Assistant

## 2023-05-06 ENCOUNTER — Telehealth: Payer: BC Managed Care – PPO | Admitting: Family Medicine

## 2023-05-06 DIAGNOSIS — J019 Acute sinusitis, unspecified: Secondary | ICD-10-CM | POA: Diagnosis not present

## 2023-05-06 DIAGNOSIS — B9689 Other specified bacterial agents as the cause of diseases classified elsewhere: Secondary | ICD-10-CM | POA: Diagnosis not present

## 2023-05-06 MED ORDER — AMOXICILLIN-POT CLAVULANATE 875-125 MG PO TABS: 1.0000 | ORAL_TABLET | Freq: Two times a day (BID) | ORAL | 0 refills | Status: DC

## 2023-05-06 NOTE — Addendum Note (Signed)
Addended by: Margaretann Loveless on: 05/06/2023 07:19 PM   Modules accepted: Orders

## 2023-05-06 NOTE — Progress Notes (Signed)

## 2023-05-06 NOTE — Telephone Encounter (Signed)
This encounter was created in error - please disregard.

## 2023-05-06 NOTE — Progress Notes (Signed)
Erroneous, duplicate. Please disregard.

## 2023-05-17 ENCOUNTER — Ambulatory Visit: Payer: BC Managed Care – PPO | Admitting: Internal Medicine

## 2023-05-23 ENCOUNTER — Other Ambulatory Visit: Payer: Self-pay | Admitting: Internal Medicine

## 2023-05-30 DIAGNOSIS — M5416 Radiculopathy, lumbar region: Secondary | ICD-10-CM | POA: Diagnosis not present

## 2023-06-28 DIAGNOSIS — M1612 Unilateral primary osteoarthritis, left hip: Secondary | ICD-10-CM | POA: Diagnosis not present

## 2023-06-28 DIAGNOSIS — M25551 Pain in right hip: Secondary | ICD-10-CM | POA: Diagnosis not present

## 2023-07-06 ENCOUNTER — Telehealth: Payer: BC Managed Care – PPO | Admitting: Family Medicine

## 2023-07-06 DIAGNOSIS — K529 Noninfective gastroenteritis and colitis, unspecified: Secondary | ICD-10-CM

## 2023-07-06 NOTE — Progress Notes (Signed)
Virtual Visit Consent   Seth Doyle, you are scheduled for a virtual visit with a [facility] provider today. Just as with appointments in the office, your consent must be obtained to participate. Your consent will be active for this visit and any virtual visit you may have with one of our providers in the next 365 days. If you have a MyChart account, a copy of this consent can be sent to you electronically.  As this is a virtual visit, video technology does not allow for your provider to perform a traditional examination. This may limit your provider's ability to fully assess your condition. If your provider identifies any concerns that need to be evaluated in person or the need to arrange testing (such as labs, EKG, etc.), we will make arrangements to do so. Although advances in technology are sophisticated, we cannot ensure that it will always work on either your end or our end. If the connection with a video visit is poor, the visit may have to be switched to a telephone visit. With either a video or telephone visit, we are not always able to ensure that we have a secure connection.  By engaging in this virtual visit, you consent to the provision of healthcare and authorize for your insurance to be billed (if applicable) for the services provided during this visit. Depending on your insurance coverage, you may receive a charge related to this service.  I need to obtain your verbal consent now. Are you willing to proceed with your visit today? Seth Doyle has provided verbal consent on 07/06/2023 for a virtual visit (video or telephone). Seth Doyle, [place]  Date: 07/06/2023 12:41 PM   Virtual Visit via Video Note   IReed Doyle, connected with  Seth Doyle  ([id number], 1955/07/28) on 07/06/23 at 12:15 PM EST by a video-enabled telemedicine application and verified that I am speaking with the correct person using two identifiers.  Location: Patient: Virtual Visit Location  Patient: Home Provider: Virtual Visit Location Provider: Home Office   I discussed the limitations of evaluation and management by telemedicine and the availability of in person appointments. The patient expressed understanding and agreed to proceed.    History of Present Illness: Seth Doyle is a 68 y.o. who identifies as a male who was assigned male at birth, and is being seen today for c/o having lower abdominal pain x 3 days. Pt states he has had nausea, vomiting and diarrhea.  Pt states has a fever.  Pt states has had two bouts of vomiting and diarrhea. Pt states he has had no vomiting and diarrhea. Pt states he has a surgery to remove part of his colon for diverticulitis.   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   Vitamin D deficiency 05/02/2014   Encounter for preventive health examination 04/27/2014   Chronic insomnia 03/26/2014   Diverticulosis of colon without hemorrhage 02/04/2011  Allergies:  Allergies  Allergen Reactions   Wellbutrin [Bupropion] Itching   Medications:  Current Outpatient Medications:    amoxicillin-clavulanate (AUGMENTIN) 875-125 MG tablet, Take 1 tablet by mouth 2 (two) times daily., Disp: 14 tablet, Rfl: 0   Ascorbic Acid (VITAMIN C) 100 MG tablet, Take 100 mg by mouth daily., Disp: , Rfl:    chlorpheniramine-HYDROcodone (TUSSIONEX) 10-8 MG/5ML, Take 5 mLs by mouth at bedtime as needed for  cough., Disp: 70 mL, Rfl: 0   clobetasol cream (TEMOVATE) 0.05 %, Apply twice daily to affected areas on body as needed for rash/itching. Avoid applying to face, groin, and axilla., Disp: 30 g, Rfl: 2   cyclobenzaprine (FLEXERIL) 10 MG tablet, Take 1 tablet (10 mg total) by mouth 3 (three) times daily as needed for muscle spasms., Disp: 30 tablet, Rfl: 0   gabapentin (NEURONTIN) 100 MG capsule, Take 1 capsule (100 mg total) by mouth 2 (two) times daily., Disp: 30 capsule, Rfl: 1   hydrOXYzine (ATARAX) 25 MG tablet, Take 1 tablet at bedtime as needed for itching., Disp: 30 tablet, Rfl: 3   meloxicam (MOBIC) 15 MG tablet, Take 1 tablet every day by oral route for 30 days., Disp: , Rfl:    predniSONE (DELTASONE) 10 MG tablet, Take 4 tablets ( total 40 mg) by mouth for 2 days; take 3 tablets ( total 30 mg) by mouth for 2 days; take 2 tablets ( total 20 mg) by mouth for 1 day; take 1 tablet ( total 10 mg) by mouth for 1 day., Disp: 17 tablet, Rfl: 0   predniSONE (DELTASONE) 10 MG tablet, Take 4 tablets ( total 40 mg) by mouth for 2 days; take 3 tablets ( total 30 mg) by mouth for 2 days; take 2 tablets ( total 20 mg) by mouth for 1 day; take 1 tablet ( total 10 mg) by mouth for 1 day., Disp: 17 tablet, Rfl: 0   zolpidem (AMBIEN) 10 MG tablet, TAKE 1/2 TABLET BY MOUTH EVERY NIGHT AT BEDTIME AS NEEDED FOR SLEEP, Disp: 30 tablet, Rfl: 0  Observations/Objective: Patient is well-developed, well-nourished in no acute distress.  Resting comfortably at home.  Head is normocephalic, atraumatic.  No labored breathing.  Speech is clear and coherent with logical content.  Patient is alert and oriented at baseline.    Assessment and Plan: 1. Gastroenteritis (Primary)  -Pt with possible gastroenteritis vs. Diverticulosis/diverticulitis -Pt states he doesn't think its diverticulitis because of his bowl resection  -Pt ws advised to seek in person evaluation via urgent care or emergency department especially for  worsening symptoms -Pt also advised to increase fluid intake and follow a bland diet.   Follow Up Instructions: I discussed the assessment and treatment plan with the patient. The patient was provided an opportunity to ask questions and all were answered. The patient agreed with the plan and demonstrated an understanding of the instructions.  A copy of instructions were sent to the patient via MyChart unless otherwise noted below.    The patient was advised to call back or seek an in-person evaluation if the symptoms worsen or if the condition fails to improve as anticipated.    Seth Pandy, PA-C

## 2023-07-06 NOTE — Patient Instructions (Signed)
Seth Doyle, thank you for joining [name], PA-C for today's virtual visit.  While this provider is not your primary care provider (PCP), if your PCP is located in our provider database this encounter information will be shared with them immediately following your visit.   A [facility] MyChart account gives you access to today's visit and all your visits, tests, and labs performed at [facility] " click here if you don't have a [facility] MyChart account or go to [email or web address]  Consent: (Patient) Seth Doyle provided verbal consent for this virtual visit at the beginning of the encounter.  Current Medications:  Current Outpatient Medications:    amoxicillin-clavulanate (AUGMENTIN) 875-125 MG tablet, Take 1 tablet by mouth 2 (two) times daily., Disp: 14 tablet, Rfl: 0   Ascorbic Acid (VITAMIN C) 100 MG tablet, Take 100 mg by mouth daily., Disp: , Rfl:    chlorpheniramine-HYDROcodone (TUSSIONEX) 10-8 MG/5ML, Take 5 mLs by mouth at bedtime as needed for cough., Disp: 70 mL, Rfl: 0   clobetasol cream (TEMOVATE) 0.05 %, Apply twice daily to affected areas on body as needed for rash/itching. Avoid applying to face, groin, and axilla., Disp: 30 g, Rfl: 2   cyclobenzaprine (FLEXERIL) 10 MG tablet, Take 1 tablet (10 mg total) by mouth 3 (three) times daily as needed for muscle spasms., Disp: 30 tablet, Rfl: 0   gabapentin (NEURONTIN) 100 MG capsule, Take 1 capsule (100 mg total) by mouth 2 (two) times daily., Disp: 30 capsule, Rfl: 1   hydrOXYzine (ATARAX) 25 MG tablet, Take 1 tablet at bedtime as needed for itching., Disp: 30 tablet, Rfl: 3   meloxicam (MOBIC) 15 MG tablet, Take 1 tablet every day by oral route for 30 days., Disp: , Rfl:    predniSONE (DELTASONE) 10 MG tablet, Take 4 tablets ( total 40 mg) by mouth for 2 days; take 3 tablets ( total 30 mg) by mouth for 2 days; take 2 tablets ( total 20 mg) by mouth for 1 day; take 1 tablet ( total 10 mg) by mouth  for 1 day., Disp: 17 tablet, Rfl: 0   predniSONE (DELTASONE) 10 MG tablet, Take 4 tablets ( total 40 mg) by mouth for 2 days; take 3 tablets ( total 30 mg) by mouth for 2 days; take 2 tablets ( total 20 mg) by mouth for 1 day; take 1 tablet ( total 10 mg) by mouth for 1 day., Disp: 17 tablet, Rfl: 0   zolpidem (AMBIEN) 10 MG tablet, TAKE 1/2 TABLET BY MOUTH EVERY NIGHT AT BEDTIME AS NEEDED FOR SLEEP, Disp: 30 tablet, Rfl: 0   Medications ordered in this encounter:  No orders of the defined types were placed in this encounter.    *If you need refills on other medications prior to your next appointment, please contact your pharmacy*  Follow-Up: Call back or seek an in-person evaluation if the symptoms worsen or if the condition fails to improve as anticipated.  [facility] [phone]  Other Instructions Viral Gastroenteritis, Adult  Viral gastroenteritis is also known as the stomach flu. This condition may affect your stomach, your small intestine, and your large intestine. It can cause sudden watery poop (diarrhea), fever, and vomiting. This condition is caused by certain germs (viruses). These germs can be passed from person to person very easily (are contagious). Having watery poop and vomiting can make you feel weak and cause you to not have enough water in your body (get dehydrated). This can  make you tired and thirsty, make you have a dry mouth, and make it so you pee (urinate) less often. It is important to replace the fluids that you lose from having watery poop and vomiting. What are the causes? You can get sick by catching germs from other people. You can also get sick by: Eating food, drinking water, or touching a surface that has the germs on it (is contaminated). Sharing utensils or other personal items with a person who is sick. What increases the risk? Having a weak body defense system (immune system). Living with one or more children who are younger than 2  years. Living in a nursing home. Going on cruise ships. What are the signs or symptoms? Symptoms of this condition start suddenly. Symptoms may last for a few days or for as long as a week. Common symptoms include: Watery poop. Vomiting. Other symptoms include: Fever. Headache. Feeling tired (fatigue). Pain in the belly (abdomen). Chills. Feeling weak. Feeling like you may vomit (nauseous). Muscle aches. Not feeling hungry. How is this treated? This condition typically goes away on its own. The focus of treatment is to replace the fluids that you lose. This condition may be treated with: An ORS (oral rehydration solution). This is a drink that helps you replace fluids and minerals your body lost. It is sold at pharmacies and stores. Medicines to help with your symptoms. Probiotic supplements to reduce symptoms of watery poop. Fluids given through an IV tube, if needed. Older adults and people with other diseases or a weak body defense system are at higher risk for not having enough water in the body. Follow these instructions at home: Eating and drinking  Take an ORS as told by your doctor. Drink clear fluids in small amounts as you are able. Clear fluids include: Water. Ice chips. Fruit juice that has water added to it (is diluted). Low-calorie sports drinks. Drink enough fluid to keep your pee (urine) pale yellow. Eat small amounts of healthy foods every 3-4 hours as you are able. This may include whole grains, fruits, vegetables, lean meats, and yogurt. Avoid fluids that have a lot of sugar or caffeine in them. This includes energy drinks, sports drinks, and soda. Avoid spicy or fatty foods. Avoid alcohol. General instructions  Wash your hands often. This is very important after you have watery poop or you vomit. If you cannot use soap and water, use hand sanitizer. Make sure that all people in your home wash their hands well and often. Take over-the-counter and  prescription medicines only as told by your doctor. Rest at home while you get better. Watch your condition for any changes. Take a warm bath to help with any burning or pain from having watery poop. Keep all follow-up visits. Contact a doctor if: You cannot keep fluids down. Your symptoms get worse. You have new symptoms. You feel light-headed or dizzy. You have muscle cramps. Get help right away if: You have chest pain. You have trouble breathing, or you are breathing very fast. You have a fast heartbeat. You feel very weak or you faint. You have a very bad headache, a stiff neck, or both. You have a rash. You have very bad pain, cramping, or bloating in your belly. Your skin feels cold and clammy. You feel mixed up (confused). You have pain when you pee. You have signs of not having enough water in the body, such as: Dark pee, hardly any pee, or no pee. Cracked lips. Dry mouth. Sunken  eyes. Feeling very sleepy. Feeling weak. You have signs of bleeding, such as: You see blood in your vomit. Your vomit looks like coffee grounds. You have bloody or black poop or poop that looks like tar. These symptoms may be an emergency. Get help right away. Call 911. Do not wait to see if the symptoms will go away. Do not drive yourself to the hospital. Summary Viral gastroenteritis is also known as the stomach flu. This condition can cause sudden watery poop (diarrhea), fever, and vomiting. These germs can be passed from person to person very easily. Take an ORS (oral rehydration solution) as told by your doctor. This is a drink that is sold at pharmacies and stores. Wash your hands often, especially after having watery poop or vomiting. If you cannot use soap and water, use hand sanitizer. This information is not intended to replace advice given to you by your health care provider. Make sure you discuss any questions you have with your health care provider. Document Revised: 03/06/2021  Document Reviewed: 03/06/2021 Elsevier Patient Education  2024 [employer]   If you have been instructed to have an in-person evaluation today at a local Urgent Care facility, please use the link below. It will take you to a list of all of our available [facility], including address, phone number and hours of operation. Please do not delay care.  [facility]  If you or a family member do not have a primary care provider, use the link below to schedule a visit and establish care. When you choose a [facility] primary care physician or advanced practice provider, you gain a long-term partner in health. Find a Primary Care Provider  Learn more about [facility]'s in-office and virtual care options: [facility] - Get Care Now

## 2023-07-12 DIAGNOSIS — M5416 Radiculopathy, lumbar region: Secondary | ICD-10-CM | POA: Diagnosis not present

## 2023-07-12 DIAGNOSIS — M48062 Spinal stenosis, lumbar region with neurogenic claudication: Secondary | ICD-10-CM | POA: Diagnosis not present

## 2023-07-15 ENCOUNTER — Other Ambulatory Visit: Payer: Self-pay | Admitting: Internal Medicine

## 2023-07-16 ENCOUNTER — Other Ambulatory Visit: Payer: Self-pay | Admitting: Internal Medicine

## 2023-07-16 NOTE — Telephone Encounter (Signed)
 Refilled: 05/26/2023 Last OV: 12/21/2022 Next OV: 08/02/2022

## 2023-07-17 ENCOUNTER — Encounter: Payer: Self-pay | Admitting: Internal Medicine

## 2023-07-26 ENCOUNTER — Ambulatory Visit: Payer: BC Managed Care – PPO | Admitting: Internal Medicine

## 2023-08-02 ENCOUNTER — Ambulatory Visit (INDEPENDENT_AMBULATORY_CARE_PROVIDER_SITE_OTHER): Payer: BC Managed Care – PPO | Admitting: Internal Medicine

## 2023-08-02 VITALS — BP 136/78 | HR 82 | Ht 70.0 in | Wt 193.8 lb

## 2023-08-02 DIAGNOSIS — M48061 Spinal stenosis, lumbar region without neurogenic claudication: Secondary | ICD-10-CM

## 2023-08-02 DIAGNOSIS — R7301 Impaired fasting glucose: Secondary | ICD-10-CM | POA: Diagnosis not present

## 2023-08-02 DIAGNOSIS — Z0001 Encounter for general adult medical examination with abnormal findings: Secondary | ICD-10-CM

## 2023-08-02 DIAGNOSIS — E559 Vitamin D deficiency, unspecified: Secondary | ICD-10-CM

## 2023-08-02 DIAGNOSIS — E785 Hyperlipidemia, unspecified: Secondary | ICD-10-CM

## 2023-08-02 DIAGNOSIS — R030 Elevated blood-pressure reading, without diagnosis of hypertension: Secondary | ICD-10-CM

## 2023-08-02 DIAGNOSIS — D509 Iron deficiency anemia, unspecified: Secondary | ICD-10-CM

## 2023-08-02 DIAGNOSIS — Z125 Encounter for screening for malignant neoplasm of prostate: Secondary | ICD-10-CM

## 2023-08-02 DIAGNOSIS — F5104 Psychophysiologic insomnia: Secondary | ICD-10-CM

## 2023-08-02 DIAGNOSIS — E278 Other specified disorders of adrenal gland: Secondary | ICD-10-CM

## 2023-08-02 DIAGNOSIS — M5416 Radiculopathy, lumbar region: Secondary | ICD-10-CM

## 2023-08-02 DIAGNOSIS — R5383 Other fatigue: Secondary | ICD-10-CM

## 2023-08-02 DIAGNOSIS — M5442 Lumbago with sciatica, left side: Secondary | ICD-10-CM

## 2023-08-02 NOTE — Progress Notes (Signed)
 Patient ID: Seth Doyle, male    DOB: November 08, 1955  Age: 68 y.o. MRN: [id number]  The patient is here for annual preventive examination and management of other chronic and acute problems.   The risk factors are reflected in the social history.   The roster of all physicians providing medical care to patient - is listed in the Snapshot section of the chart.   Activities of daily living:  The patient is 100% independent in all ADLs: dressing, toileting, feeding as well as independent mobility   Home safety : The patient has smoke detectors in the home. They wear seatbelts.  There are no unsecured firearms at home. There is no violence in the home.    There is no risks for hepatitis, STDs or HIV. There is no   history of blood transfusion. They have no travel history to infectious disease endemic areas of the world.   The patient has seen their dentist in the last six month. They have seen their eye doctor in the last year. The patinet  denies slight hearing difficulty with regard to whispered voices and some television programs.  They have deferred audiologic testing in the last year.  They do not  have excessive sun exposure. Discussed the need for sun protection: hats, long sleeves and use of sunscreen if there is significant sun exposure.    Diet: the importance of a healthy diet is discussed. They do have a healthy diet.   The benefits of regular aerobic exercise were discussed. The patient  exercises  3 to 5 days per week  for  60 minutes.    Depression screen: there are no signs or vegative symptoms of depression- irritability, change in appetite, anhedonia, sadness/tearfullness.   The following portions of the patient's history were reviewed and updated as appropriate: allergies, current medications, past family history, past medical history,  past surgical history, past social history  and problem list.   Visual acuity was not assessed per patient preference since the patient has  regular follow up with an  ophthalmologist. Hearing and body mass index were assessed and reviewed.    During the course of the visit the patient was educated and counseled about appropriate screening and preventive services including : fall prevention , diabetes screening, nutrition counseling, colorectal cancer screening, and recommended immunizations.    Chief Complaint:  Back pain:   he has lumbar spinal stenosis managed by Dr Seth Doyle   Weight gain of 14 lbs due to back pain and limitation of exercise   Review of Symptoms  Patient denies headache, fevers, malaise, unintentional weight loss, skin rash, eye pain, sinus congestion and sinus pain, sore throat, dysphagia,  hemoptysis , cough, dyspnea, wheezing, chest pain, palpitations, orthopnea, edema, abdominal pain, nausea, melena, diarrhea, constipation, flank pain, dysuria, hematuria, urinary  Frequency, nocturia, numbness, tingling, seizures,  Focal weakness, Loss of consciousness,  Tremor, insomnia, depression, anxiety, and suicidal ideation.    Physical Exam:  BP (!) 146/90   Pulse 82   Ht 5\' 10"  (1.778 m)   Wt 193 lb 12.8 oz (87.9 kg)   SpO2 96%   BMI 27.81 kg/m    Physical Exam Vitals reviewed.  Constitutional:      General: He is not in acute distress.    Appearance: Normal appearance. He is normal weight. He is not ill-appearing, toxic-appearing or diaphoretic.  HENT:     Head: Normocephalic and atraumatic.     Right Ear: Tympanic membrane, ear canal and external ear normal.  There is no impacted cerumen.     Left Ear: Tympanic membrane, ear canal and external ear normal. There is no impacted cerumen.     Nose: Nose normal.     Mouth/Throat:     Mouth: Mucous membranes are moist.     Pharynx: Oropharynx is clear.  Eyes:     General: No scleral icterus.       Right eye: No discharge.        Left eye: No discharge.     Conjunctiva/sclera: Conjunctivae normal.  Neck:     Thyroid: No thyromegaly.     Vascular: No  carotid bruit or JVD.  Cardiovascular:     Rate and Rhythm: Normal rate and regular rhythm.     Heart sounds: Normal heart sounds.  Pulmonary:     Effort: Pulmonary effort is normal. No respiratory distress.     Breath sounds: Normal breath sounds.  Abdominal:     General: Bowel sounds are normal.     Palpations: Abdomen is soft. There is no mass.     Tenderness: There is no abdominal tenderness. There is no guarding or rebound.  Musculoskeletal:        General: Normal range of motion.     Cervical back: Normal range of motion and neck supple.  Lymphadenopathy:     Cervical: No cervical adenopathy.  Skin:    General: Skin is warm and dry.  Neurological:     General: No focal deficit present.     Mental Status: He is alert and oriented to person, place, and time. Mental status is at baseline.  Psychiatric:        Mood and Affect: Mood normal.        Behavior: Behavior normal.        Thought Content: Thought content normal.        Judgment: Judgment normal.    Assessment and Plan: Encounter for general adult medical examination with abnormal findings Assessment & Plan: age appropriate education and counseling updated, referrals for preventative services and immunizations addressed, dietary and smoking counseling addressed, most recent labs reviewed.  I have personally reviewed and have noted:   1) the patient's medical and social history 2) The pt's use of alcohol, tobacco, and illicit drugs 3) The patient's current medications and supplements 4) Functional ability including ADL's, fall risk, home safety risk, hearing and visual impairment 5) Diet and physical activities 6) Evidence for depression or mood disorder 7) The patient's height, weight, and BMI have been recorded in the chart      I have made referrals, and provided counseling and education based on review of the above    Iron deficiency anemia, unspecified iron deficiency anemia type -     CBC with  Differential/Platelet -     Iron, TIBC and Ferritin Panel; Future  Fatigue, unspecified type -     TSH -     B12 and Folate Panel; Future  Impaired fasting glucose -     Comprehensive metabolic panel -     Microalbumin / creatinine urine ratio -     Hemoglobin A1c  Hyperlipidemia, unspecified hyperlipidemia type -     Lipid panel -     LDL cholesterol, direct  Prostate cancer screening -     PSA  Spinal stenosis of lumbar region with radiculopathy Assessment & Plan: Reviewed MRI ordered by Dr Seth Doyle.  No surgery planned  unless his symptomscannot be controlled ; he will referred by [facility]  Neurosurgery,  currently managing pain with flexerill meloxicam and gabapentin.  Uses hydrocodone  prn prescribed by Dr Seth Doyle    Vitamin D deficiency Assessment & Plan: Recurrent without supplementation.  Resume  supplements given history of  melanoma  Orders: -     VITAMIN D 25 Hydroxy (Vit-D Deficiency, Fractures)  Chronic insomnia Assessment & Plan: managed with ambien ; trial of non pharmcologic methods of relaxation wre not effective.  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 And no micraoalbuminuria.   He will check his blood pressure several times over the next 3-4 weeks and to submit readings for evaluation. After suspending NSAIDS    Lab Results  Component Value Date   MICROALBUR 2.1 08/02/2023   MICROALBUR <0.7 05/22/2022        Other orders -     Ergocalciferol; Take 1 capsule (50,000 Units total) by mouth once a week.  Dispense: 12 capsule; Refill: 0    No follow-ups on file.  Sherlene Shams, MD

## 2023-08-02 NOTE — Patient Instructions (Addendum)
 Your blood pressure is elevated today,  the urine tests I have ordered will determine if you need to be on certain medications   Screening for thyroid. Cholesterol, diabetes today   Your next colonoscopy is due in 2031 per Dr Servando Snare

## 2023-08-02 NOTE — Assessment & Plan Note (Addendum)
 Reviewed MRI ordered by Dr Ethelene Hal.  No surgery planned  unless his symptomscannot be controlled ; he will referred by [facility],  currently managing pain with flexerill meloxicam and gabapentin.  Uses hydrocodone  prn prescribed by Dr Ethelene Hal

## 2023-08-03 LAB — CBC WITH DIFFERENTIAL/PLATELET
Absolute Lymphocytes: 1686 {cells}/uL (ref 850–3900)
Absolute Monocytes: 616 {cells}/uL (ref 200–950)
Basophils Absolute: 23 {cells}/uL (ref 0–200)
Basophils Relative: 0.3 %
Eosinophils Absolute: 154 {cells}/uL (ref 15–500)
Eosinophils Relative: 2 %
HCT: 39.3 % (ref 38.5–50.0)
Hemoglobin: 12.4 g/dL — ABNORMAL LOW (ref 13.2–17.1)
MCH: 25.9 pg — ABNORMAL LOW (ref 27.0–33.0)
MCHC: 31.6 g/dL — ABNORMAL LOW (ref 32.0–36.0)
MCV: 82 fL (ref 80.0–100.0)
MPV: 10.4 fL (ref 7.5–12.5)
Monocytes Relative: 8 %
Neutro Abs: 5221 {cells}/uL (ref 1500–7800)
Neutrophils Relative %: 67.8 %
Platelets: 211 10*3/uL (ref 140–400)
RBC: 4.79 10*6/uL (ref 4.20–5.80)
RDW: 13 % (ref 11.0–15.0)
Total Lymphocyte: 21.9 %
WBC: 7.7 10*3/uL (ref 3.8–10.8)

## 2023-08-03 LAB — COMPREHENSIVE METABOLIC PANEL
AG Ratio: 2 (calc) (ref 1.0–2.5)
ALT: 12 U/L (ref 9–46)
AST: 17 U/L (ref 10–35)
Albumin: 4.2 g/dL (ref 3.6–5.1)
Alkaline phosphatase (APISO): 63 U/L (ref 35–144)
BUN: 19 mg/dL (ref 7–25)
CO2: 25 mmol/L (ref 20–32)
Calcium: 9.5 mg/dL (ref 8.6–10.3)
Chloride: 103 mmol/L (ref 98–110)
Creat: 1.2 mg/dL (ref 0.70–1.35)
Globulin: 2.1 g/dL (ref 1.9–3.7)
Glucose, Bld: 92 mg/dL (ref 65–99)
Potassium: 4 mmol/L (ref 3.5–5.3)
Sodium: 138 mmol/L (ref 135–146)
Total Bilirubin: 0.5 mg/dL (ref 0.2–1.2)
Total Protein: 6.3 g/dL (ref 6.1–8.1)

## 2023-08-03 LAB — LIPID PANEL
Cholesterol: 118 mg/dL (ref <200)
HDL: 41 mg/dL (ref >=40)
LDL Cholesterol (Calc): 61 mg/dL
Non-HDL Cholesterol (Calc): 77 mg/dL (ref <130)
Total CHOL/HDL Ratio: 2.9 (calc) (ref <5.0)
Triglycerides: 84 mg/dL (ref <150)

## 2023-08-03 LAB — HEMOGLOBIN A1C
Hgb A1c MFr Bld: 5.6 %{Hb} (ref <5.7)
Mean Plasma Glucose: 114 mg/dL
eAG (mmol/L): 6.3 mmol/L

## 2023-08-03 LAB — PSA: PSA: 1.2 ng/mL (ref <=4.00)

## 2023-08-03 LAB — MICROALBUMIN / CREATININE URINE RATIO
Creatinine, Urine: 181 mg/dL (ref 20–320)
Microalb Creat Ratio: 12 mg/g{creat} (ref <30)
Microalb, Ur: 2.1 mg/dL

## 2023-08-03 LAB — TSH: TSH: 1.12 m[IU]/L (ref 0.40–4.50)

## 2023-08-03 LAB — VITAMIN D 25 HYDROXY (VIT D DEFICIENCY, FRACTURES): Vit D, 25-Hydroxy: 21 ng/mL — ABNORMAL LOW (ref 30–100)

## 2023-08-03 LAB — LDL CHOLESTEROL, DIRECT: Direct LDL: 68 mg/dL (ref <100)

## 2023-08-04 ENCOUNTER — Encounter: Payer: Self-pay | Admitting: Internal Medicine

## 2023-08-04 DIAGNOSIS — D500 Iron deficiency anemia secondary to blood loss (chronic): Secondary | ICD-10-CM

## 2023-08-04 MED ORDER — ERGOCALCIFEROL 1.25 MG (50000 UT) PO CAPS: 50000.0000 [IU] | ORAL_CAPSULE | ORAL | 0 refills | Status: DC

## 2023-08-04 NOTE — Assessment & Plan Note (Signed)
 Stable, noted first in 2012,  by 2021 CT

## 2023-08-04 NOTE — Assessment & Plan Note (Addendum)
 He has no prior history of hypertension. And no micraoalbuminuria.   He will check his blood pressure several times over the next 3-4 weeks and to submit readings for evaluation. After suspending NSAIDS    Lab Results  Component Value Date   MICROALBUR 2.1 08/02/2023   MICROALBUR <0.7 05/22/2022

## 2023-08-04 NOTE — Assessment & Plan Note (Signed)
 Recurrent without supplementation.  Resume  supplements given history of  melanoma

## 2023-08-04 NOTE — Assessment & Plan Note (Signed)
 managed with ambien ; trial of non pharmcologic methods of relaxation wre not effective.  Ambien refilled

## 2023-08-04 NOTE — Assessment & Plan Note (Signed)

## 2023-08-06 NOTE — Assessment & Plan Note (Signed)
 Noted March 2025. Last blood donation was mid January 2025   Iron, b12 and FOBT ordered

## 2023-08-12 ENCOUNTER — Encounter: Payer: Self-pay | Admitting: Internal Medicine

## 2023-08-12 ENCOUNTER — Other Ambulatory Visit: Payer: Self-pay | Admitting: Internal Medicine

## 2023-08-12 MED ORDER — AMLODIPINE BESYLATE 2.5 MG PO TABS: 2.5000 mg | ORAL_TABLET | Freq: Every day | ORAL | 1 refills | Status: DC

## 2023-08-12 NOTE — Telephone Encounter (Signed)
 Pt self scheduled appointment for follow up on mychart

## 2023-08-14 DIAGNOSIS — M25552 Pain in left hip: Secondary | ICD-10-CM | POA: Diagnosis not present

## 2023-08-20 ENCOUNTER — Ambulatory Visit: Admitting: Urology

## 2023-08-20 VITALS — BP 165/103 | HR 80 | Ht 71.0 in | Wt 191.1 lb

## 2023-08-20 DIAGNOSIS — N529 Male erectile dysfunction, unspecified: Secondary | ICD-10-CM | POA: Diagnosis not present

## 2023-08-20 MED ORDER — TADALAFIL 10 MG PO TABS: 10.0000 mg | ORAL_TABLET | Freq: Every day | ORAL | 11 refills | Status: AC

## 2023-08-20 NOTE — Progress Notes (Signed)
   08/20/2023 3:59 PM   Seth Doyle August 11, 1955 [id number]  Reason for visit: Erectile dysfunction  HPI: 68 year old male who I previously saw in July 2024 for small physiologic hydroceles on ultrasound, normal PSA, and history of kidney stones who presents today with ED.  This has been present about a year.  He previously has used Viagra and Cialis in the past with success.  He also previously was on testosterone injections and had severe side effect of hypertension with SBP > 200 and was told he could not be on testosterone again.  No recent testosterone levels to review.  Vitamin D level recently was low, and I recommended supplementation.  We reviewed the AUA guidelines regarding evaluation and management of patients with EGD.  I recommended a trial of Cialis 10 mg daily with a 10 mg boost dose as needed He will contact us via MyChart in the next few weeks with an update   Sondra Come, MD  [facility] [address] [place], [place] [address] [phone]

## 2023-09-13 ENCOUNTER — Ambulatory Visit: Admitting: Internal Medicine

## 2023-09-15 ENCOUNTER — Other Ambulatory Visit: Payer: Self-pay | Admitting: Internal Medicine

## 2023-09-27 DIAGNOSIS — M48062 Spinal stenosis, lumbar region with neurogenic claudication: Secondary | ICD-10-CM | POA: Diagnosis not present

## 2023-09-27 DIAGNOSIS — M545 Low back pain, unspecified: Secondary | ICD-10-CM | POA: Diagnosis not present

## 2023-09-27 DIAGNOSIS — M5459 Other low back pain: Secondary | ICD-10-CM | POA: Diagnosis not present

## 2023-09-30 DIAGNOSIS — Z79899 Other long term (current) drug therapy: Secondary | ICD-10-CM | POA: Diagnosis not present

## 2023-10-01 ENCOUNTER — Ambulatory Visit: Payer: BC Managed Care – PPO | Admitting: Dermatology

## 2023-10-02 ENCOUNTER — Ambulatory Visit: Admitting: Dermatology

## 2023-10-02 DIAGNOSIS — Z86006 Personal history of melanoma in-situ: Secondary | ICD-10-CM

## 2023-10-02 DIAGNOSIS — L814 Other melanin hyperpigmentation: Secondary | ICD-10-CM

## 2023-10-02 DIAGNOSIS — L578 Other skin changes due to chronic exposure to nonionizing radiation: Secondary | ICD-10-CM | POA: Diagnosis not present

## 2023-10-02 DIAGNOSIS — W908XXA Exposure to other nonionizing radiation, initial encounter: Secondary | ICD-10-CM | POA: Diagnosis not present

## 2023-10-02 DIAGNOSIS — D23111 Other benign neoplasm of skin of right upper eyelid, including canthus: Secondary | ICD-10-CM

## 2023-10-02 DIAGNOSIS — L309 Dermatitis, unspecified: Secondary | ICD-10-CM

## 2023-10-02 DIAGNOSIS — L570 Actinic keratosis: Secondary | ICD-10-CM

## 2023-10-02 DIAGNOSIS — Z1283 Encounter for screening for malignant neoplasm of skin: Secondary | ICD-10-CM

## 2023-10-02 DIAGNOSIS — D1801 Hemangioma of skin and subcutaneous tissue: Secondary | ICD-10-CM

## 2023-10-02 DIAGNOSIS — L821 Other seborrheic keratosis: Secondary | ICD-10-CM

## 2023-10-02 DIAGNOSIS — L853 Xerosis cutis: Secondary | ICD-10-CM

## 2023-10-02 DIAGNOSIS — D2310 Other benign neoplasm of skin of unspecified eyelid, including canthus: Secondary | ICD-10-CM

## 2023-10-02 DIAGNOSIS — L299 Pruritus, unspecified: Secondary | ICD-10-CM

## 2023-10-02 DIAGNOSIS — D229 Melanocytic nevi, unspecified: Secondary | ICD-10-CM

## 2023-10-02 MED ORDER — CLOBETASOL PROPIONATE 0.05 % EX SOLN: CUTANEOUS | 3 refills | Status: AC

## 2023-10-02 NOTE — Progress Notes (Signed)
 Follow-Up Visit   Subjective  Seth Doyle is a 68 y.o. male who presents for the following: Skin Cancer Screening and Upper Body Skin Exam  The patient presents for Upper Body Skin Exam (UBSE) for skin cancer screening and mole check. The patient has spots, moles and lesions to be evaluated, some may be new or changing.     The following portions of the chart were reviewed this encounter and updated as appropriate: medications, allergies, medical history  Review of Systems:  No other skin or systemic complaints except as noted in HPI or Assessment and Plan.  Objective  Well appearing patient in no apparent distress; mood and affect are within normal limits.  All skin waist up examined. Relevant physical exam findings are noted in the Assessment and Plan.  inferior vertex x 3 (3) Pink scaly macules.  Assessment & Plan   AK (ACTINIC KERATOSIS) (3) inferior vertex x 3 (3) Actinic keratoses are precancerous spots that appear secondary to cumulative UV radiation exposure/sun exposure over time. They are chronic with expected duration over 1 year. A portion of actinic keratoses will progress to squamous cell carcinoma of the skin. It is not possible to reliably predict which spots will progress to skin cancer and so treatment is recommended to prevent development of skin cancer.  Recommend daily broad spectrum sunscreen SPF 30+ to sun-exposed areas, reapply every 2 hours as needed.  Recommend staying in the shade or wearing long sleeves, sun glasses (UVA+UVB protection) and wide brim hats (4-inch brim around the entire circumference of the hat). Call for new or changing lesions. Destruction of lesion - inferior vertex x 3 (3)  Destruction method: cryotherapy   Informed consent: discussed and consent obtained   Lesion destroyed using liquid nitrogen: Yes   Region frozen until ice ball extended beyond lesion: Yes   Outcome: patient tolerated procedure well with no complications    Post-procedure details: wound care instructions given   Additional details:  Prior to procedure, discussed risks of blister formation, small wound, skin dyspigmentation, or rare scar following cryotherapy. Recommend Vaseline ointment to treated areas while healing.  DERMATITIS   Related Medications clobetasol  cream (TEMOVATE ) 0.05 % Apply twice daily to affected areas on body as needed for rash/itching. Avoid applying to face, groin, and axilla.  Skin cancer screening performed today.  Actinic Damage - Chronic condition, secondary to cumulative UV/sun exposure - diffuse scaly erythematous macules with underlying dyspigmentation - Recommend daily broad spectrum sunscreen SPF 30+ to sun-exposed areas, reapply every 2 hours as needed.  - Staying in the shade or wearing long sleeves, sun glasses (UVA+UVB protection) and wide brim hats (4-inch brim around the entire circumference of the hat) are also recommended for sun protection.  - Call for new or changing lesions.  Lentigines, Seborrheic Keratoses, Hemangiomas - Benign normal skin lesions - Benign-appearing - Call for any changes  Melanocytic Nevi - Tan-brown and/or pink-flesh-colored symmetric macules and papules - Benign appearing on exam today - Observation - Call clinic for new or changing moles - Recommend daily use of broad spectrum spf 30+ sunscreen to sun-exposed areas.   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 - No evidence of recurrence today of the right upper forehead - Recommend regular full body skin exams - Recommend daily broad spectrum sunscreen SPF 30+ to sun-exposed areas, reapply every 2 hours as needed.  - Call if any new or changing lesions are noted between office visits  Xerosis  Cutis with Pruritus Exam: trunk Clear today, pt states itchy rash comes and goes   Chronic condition with duration or expected duration over one year. Currently well-controlled with topical  treatment as needed for flares.      Treatment Plan: Continue Hydroxyzine  25mg  1 tablet at bedtime as needed for itching. Patient will call for refills.   Continue Clobetasol /CeraVe cream mix to aas body qd/bid prn itchy rash up to 4 weeks, avoid f/g/a. Continue Clobetasol  Cream QD/BID prn itch to more severe areas up to 2 weeks. Caution skin atrophy with long-term use.  Continue CeraVe Itch Relief Cream.    Topical steroids (such as triamcinolone , fluocinolone, fluocinonide, mometasone, clobetasol , halobetasol, betamethasone, hydrocortisone) can cause thinning and lightening of the skin if they are used for too long in the same area. Your physician has selected the right strength medicine for your problem and area affected on the body. Please use your medication only as directed by your physician to prevent side effects.   Recommend mild soap and moisturizing cream 1-2 times daily.    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  Observation.  Stable. Call clinic for new or changing lesions.  Recommend daily use of broad spectrum spf 30+ sunscreen to sun-exposed areas.     Return in about 6 months (around 04/03/2024) for UBSE, Hx melanoma in situ.  IBernardine Bridegroom, CMA, am acting as scribe for Artemio Larry, MD .   Documentation: I have reviewed the above documentation for accuracy and completeness, and I agree with the above.  Artemio Larry, MD

## 2023-10-02 NOTE — Patient Instructions (Addendum)

## 2023-10-16 ENCOUNTER — Ambulatory Visit: Admitting: Internal Medicine

## 2023-10-18 DIAGNOSIS — M47816 Spondylosis without myelopathy or radiculopathy, lumbar region: Secondary | ICD-10-CM | POA: Diagnosis not present

## 2023-10-18 DIAGNOSIS — M48061 Spinal stenosis, lumbar region without neurogenic claudication: Secondary | ICD-10-CM | POA: Diagnosis not present

## 2023-10-23 ENCOUNTER — Other Ambulatory Visit: Payer: Self-pay | Admitting: Internal Medicine

## 2023-11-01 ENCOUNTER — Encounter: Payer: Self-pay | Admitting: Internal Medicine

## 2023-11-01 ENCOUNTER — Ambulatory Visit: Admitting: Internal Medicine

## 2023-11-01 VITALS — BP 118/80 | HR 88 | Ht 71.0 in | Wt 186.8 lb

## 2023-11-01 DIAGNOSIS — M48061 Spinal stenosis, lumbar region without neurogenic claudication: Secondary | ICD-10-CM

## 2023-11-01 DIAGNOSIS — M5416 Radiculopathy, lumbar region: Secondary | ICD-10-CM

## 2023-11-01 DIAGNOSIS — G459 Transient cerebral ischemic attack, unspecified: Secondary | ICD-10-CM | POA: Diagnosis not present

## 2023-11-01 DIAGNOSIS — R29818 Other symptoms and signs involving the nervous system: Secondary | ICD-10-CM | POA: Diagnosis not present

## 2023-11-01 DIAGNOSIS — I10 Essential (primary) hypertension: Secondary | ICD-10-CM | POA: Diagnosis not present

## 2023-11-01 DIAGNOSIS — Z860100 Personal history of colon polyps, unspecified: Secondary | ICD-10-CM

## 2023-11-01 DIAGNOSIS — F5104 Psychophysiologic insomnia: Secondary | ICD-10-CM

## 2023-11-01 MED ORDER — AMLODIPINE BESYLATE 2.5 MG PO TABS: 2.5000 mg | ORAL_TABLET | Freq: Every day | ORAL | 1 refills | Status: AC

## 2023-11-01 NOTE — Progress Notes (Unsigned)
 Subjective:  Patient ID: Seth Doyle, male    DOB: 1956-04-30  Age: 68 y.o. MRN: [id number]  CC: There were no encounter diagnoses.   HPI JENSON BEEDLE presents for  Chief Complaint  Patient presents with   Medical Management of Chronic Issues   1) Had a 24 hr gi bug,  started on Monday   Staph Aureus   after eating salad at [facility] on Sunday   2) lumbar spinal stenosis : :  he  had an appointment  with a neurosurgeon Andy Bannister , was unimpressed at his  visit,  has not heard anything in 3 weeks  3) left sided numbness of arm and leg  for the past 12 months ,  intermittent.  Acc'd by tingling in left leg.  Facial numbness ('face went cold) occurred this morning  while reaching  in the garage for something,  lasted several hours acc'd by loss of balance attributed to left leg feeling clumsy which lasted on ly a few minutes.   facial muscles were working fine.  Symptoms have now COMPLETELY RESOLVED,  AND TODAYS WORKOUT WAS NORMAL .  Denies neck pain   4) HTN 128/84 at home on amlodipine  121/70 , Outpatient Medications Prior to Visit  Medication Sig Dispense Refill   amLODipine  (NORVASC ) 2.5 MG tablet Take 1 tablet (2.5 mg total) by mouth daily. 90 tablet 1   Ascorbic Acid (VITAMIN C) 100 MG tablet Take 100 mg by mouth daily.     clobetasol  (TEMOVATE ) 0.05 % external solution Patient to mix clobetasol  solution in jar of CeraVe Cream. Apply to itchy rash once to twice daily until improved. Avoid applying to face, groin, and axilla. Use as directed. Long-term use can cause thinning of the skin. 50 mL 3   clobetasol  cream (TEMOVATE ) 0.05 % Apply twice daily to affected areas on body as needed for rash/itching. Avoid applying to face, groin, and axilla. 30 g 2   cyclobenzaprine  (FLEXERIL ) 10 MG tablet Take 1 tablet (10 mg total) by mouth 3 (three) times daily as needed for muscle spasms. 30 tablet 0   gabapentin  (NEURONTIN ) 100 MG capsule Take 1 capsule (100 mg total) by mouth 2 (two)  times daily. 30 capsule 1   HYDROcodone -acetaminophen  (NORCO/VICODIN) 5-325 MG tablet Take 1 tablet by mouth every 4 (four) hours as needed.     hydrOXYzine  (ATARAX ) 25 MG tablet Take 1 tablet at bedtime as needed for itching. 30 tablet 3   meloxicam  (MOBIC ) 15 MG tablet Take 1 tablet every day by oral route for 30 days.     tadalafil  (CIALIS ) 10 MG tablet Take 1 tablet (10 mg total) by mouth daily. Take 10 mg daily with a 10 mg boost dose as needed 45 minutes prior to sexual activity 30 tablet 11   zolpidem  (AMBIEN ) 10 MG tablet TAKE A HALF TABLET BY MOUTH EVERY NIGHT AT BEDTIME AS NEEDED FOR SLEEP 30 tablet 5   Vitamin D , Ergocalciferol , (DRISDOL ) 1.25 MG (50000 UNIT) CAPS capsule TAKE 1 CAPSULE BY MOUTH ONCE WEEKLY (Patient not taking: Reported on 11/01/2023) 12 capsule 0   No facility-administered medications prior to visit.    Review of Systems;  Patient denies headache, fevers, malaise, unintentional weight loss, skin rash, eye pain, sinus congestion and sinus pain, sore throat, dysphagia,  hemoptysis , cough, dyspnea, wheezing, chest pain, palpitations, orthopnea, edema, abdominal pain, nausea, melena, diarrhea, constipation, flank pain, dysuria, hematuria, urinary  Frequency, nocturia, numbness, tingling, seizures,  Focal weakness, Loss of  consciousness,  Tremor, insomnia, depression, anxiety, and suicidal ideation.      Objective:  BP 118/80   Pulse 88   Ht 5' 11 (1.803 m)   Wt 186 lb 12.8 oz (84.7 kg)   SpO2 99%   BMI 26.05 kg/m   BP Readings from Last 3 Encounters:  11/01/23 118/80  08/20/23 (!) 165/103  08/07/23 136/78    Wt Readings from Last 3 Encounters:  11/01/23 186 lb 12.8 oz (84.7 kg)  08/20/23 191 lb 2 oz (86.7 kg)  08/02/23 193 lb 12.8 oz (87.9 kg)    Physical Exam  Lab Results  Component Value Date   HGBA1C 5.6 08/02/2023   HGBA1C 5.6 05/22/2022   HGBA1C 5.7 09/20/2017    Lab Results  Component Value Date   CREATININE 1.20 08/02/2023    CREATININE 1.17 11/16/2022   CREATININE 1.01 05/22/2022    Lab Results  Component Value Date   WBC 7.7 08/02/2023   HGB 12.4 (L) 08/02/2023   HCT 39.3 08/02/2023   PLT 211 08/02/2023   GLUCOSE 92 08/02/2023   CHOL 118 08/02/2023   TRIG 84 08/02/2023   HDL 41 08/02/2023   LDLDIRECT 68 08/02/2023   LDLCALC 61 08/02/2023   ALT 12 08/02/2023   AST 17 08/02/2023   NA 138 08/02/2023   K 4.0 08/02/2023   CL 103 08/02/2023   CREATININE 1.20 08/02/2023   BUN 19 08/02/2023   CO2 25 08/02/2023   TSH 1.12 08/02/2023   PSA 1.20 08/02/2023   HGBA1C 5.6 08/02/2023   MICROALBUR 2.1 08/02/2023    US  SCROTUM W/DOPPLER Result Date: 10/28/2022 CLINICAL DATA:  Right scrotal lump for multiple weeks EXAM: SCROTAL ULTRASOUND DOPPLER ULTRASOUND OF THE TESTICLES TECHNIQUE: Complete ultrasound examination of the testicles, epididymis, and other scrotal structures was performed. Color and spectral Doppler ultrasound were also utilized to evaluate blood flow to the testicles. COMPARISON:  None Available. FINDINGS: Right testicle Measurements: 4.7 x 1.8 x 2.6 cm. No mass or microlithiasis visualized. Note is made of a 3 mm cyst within the testicle. Left testicle Measurements: 4.6 x 1.9 x 2.7 cm. No mass or microlithiasis visualized. Right epididymis:  Normal in size and appearance. Left epididymis:  Normal in size and appearance. Hydrocele:  Small bilateral hydroceles. Varicocele:  None visualized. Pulsed Doppler interrogation of both testes demonstrates normal low resistance arterial and venous waveforms bilaterally. IMPRESSION: 1. No acute process. 2. Small bilateral hydroceles. Electronically Signed   By: Jone Neither M.D.   On: 10/28/2022 22:22    Assessment & Plan:  .There are no diagnoses linked to this encounter.   I spent 34 minutes on the day of this face to face encounter reviewing patient's  most recent visit with cardiology,  nephrology,  and neurology,  prior relevant surgical and non surgical  procedures, recent  labs and imaging studies, counseling on weight management,  reviewing the assessment and plan with patient, and post visit ordering and reviewing of  diagnostics and therapeutics with patient  .   Follow-up: No follow-ups on file.   Thersia Flax, MD

## 2023-11-01 NOTE — Patient Instructions (Signed)
 I am recommending an MR Angiogram of the Brain to evaluation the circulation given your recent transient event

## 2023-11-03 DIAGNOSIS — R29818 Other symptoms and signs involving the nervous system: Secondary | ICD-10-CM | POA: Insufficient documentation

## 2023-11-03 NOTE — Assessment & Plan Note (Signed)
Follow up colonoscopy was done March 2021,  2 mm polyp removed

## 2023-11-03 NOTE — Assessment & Plan Note (Signed)
 Reviewed MRI ordered by Dr Ethelene Hal.  No surgery planned  unless his symptomscannot be controlled ; he will referred by [facility],  currently managing pain with flexerill meloxicam and gabapentin.  Uses hydrocodone  prn prescribed by Dr Ethelene Hal

## 2023-11-03 NOTE — Assessment & Plan Note (Signed)
 managed with ambien  ; trial of non pharmcologic methods of relaxation wre not effective.  Ambien  refilled  .cautioned not to combine with opioids or opiod analaogs

## 2023-11-03 NOTE — Assessment & Plan Note (Signed)
 Well controlled on current regimenof amlodipine  2.5 mg daily.    Lab Results  Component Value Date   CREATININE 1.20 08/02/2023   Lab Results  Component Value Date   NA 138 08/02/2023   K 4.0 08/02/2023   CL 103 08/02/2023   CO2 25 08/02/2023

## 2023-11-03 NOTE — Assessment & Plan Note (Signed)
 Unclear if the report of facial numbness reported this morning, WITHOUT facial paralysis represented a TIA .  The loss of balance may have been due to preexisiting known lumbar spinal stenosis .   Ordering MRI/MA brain for further evaluation

## 2023-11-07 ENCOUNTER — Encounter: Payer: Self-pay | Admitting: Internal Medicine

## 2023-11-11 ENCOUNTER — Ambulatory Visit: Admission: RE | Admit: 2023-11-11 | Source: Ambulatory Visit

## 2023-11-19 ENCOUNTER — Emergency Department

## 2023-11-19 ENCOUNTER — Other Ambulatory Visit: Payer: Self-pay

## 2023-11-19 ENCOUNTER — Observation Stay
Admission: EM | Admit: 2023-11-19 | Discharge: 2023-11-20 | Disposition: A | Discharge status: Home / Self Care | Attending: Obstetrics and Gynecology | Admitting: Obstetrics and Gynecology

## 2023-11-19 DIAGNOSIS — G459 Transient cerebral ischemic attack, unspecified: Secondary | ICD-10-CM | POA: Diagnosis not present

## 2023-11-19 DIAGNOSIS — R531 Weakness: Secondary | ICD-10-CM

## 2023-11-19 DIAGNOSIS — R29700 NIHSS score 0: Secondary | ICD-10-CM

## 2023-11-19 DIAGNOSIS — F10920 Alcohol use, unspecified with intoxication, uncomplicated: Secondary | ICD-10-CM | POA: Diagnosis not present

## 2023-11-19 DIAGNOSIS — Z8673 Personal history of transient ischemic attack (TIA), and cerebral infarction without residual deficits: Secondary | ICD-10-CM

## 2023-11-19 DIAGNOSIS — G319 Degenerative disease of nervous system, unspecified: Secondary | ICD-10-CM | POA: Diagnosis not present

## 2023-11-19 DIAGNOSIS — M545 Low back pain, unspecified: Secondary | ICD-10-CM | POA: Diagnosis not present

## 2023-11-19 DIAGNOSIS — C439 Malignant melanoma of skin, unspecified: Secondary | ICD-10-CM | POA: Insufficient documentation

## 2023-11-19 DIAGNOSIS — R1111 Vomiting without nausea: Secondary | ICD-10-CM | POA: Diagnosis not present

## 2023-11-19 DIAGNOSIS — I639 Cerebral infarction, unspecified: Secondary | ICD-10-CM | POA: Diagnosis not present

## 2023-11-19 DIAGNOSIS — I10 Essential (primary) hypertension: Secondary | ICD-10-CM | POA: Insufficient documentation

## 2023-11-19 DIAGNOSIS — Z7982 Long term (current) use of aspirin: Secondary | ICD-10-CM | POA: Insufficient documentation

## 2023-11-19 DIAGNOSIS — R42 Dizziness and giddiness: Principal | ICD-10-CM

## 2023-11-19 DIAGNOSIS — R9082 White matter disease, unspecified: Secondary | ICD-10-CM | POA: Diagnosis not present

## 2023-11-19 DIAGNOSIS — R600 Localized edema: Secondary | ICD-10-CM

## 2023-11-19 DIAGNOSIS — R110 Nausea: Secondary | ICD-10-CM | POA: Diagnosis not present

## 2023-11-19 DIAGNOSIS — Q248 Other specified congenital malformations of heart: Secondary | ICD-10-CM

## 2023-11-19 DIAGNOSIS — R112 Nausea with vomiting, unspecified: Secondary | ICD-10-CM | POA: Diagnosis not present

## 2023-11-19 DIAGNOSIS — Z980 Intestinal bypass and anastomosis status: Secondary | ICD-10-CM | POA: Insufficient documentation

## 2023-11-19 DIAGNOSIS — R29818 Other symptoms and signs involving the nervous system: Secondary | ICD-10-CM | POA: Diagnosis not present

## 2023-11-19 LAB — URINE DRUG SCREEN, QUALITATIVE (ARMC ONLY)
Amphetamines, Ur Screen: NOT DETECTED
Barbiturates, Ur Screen: NOT DETECTED
Benzodiazepine, Ur Scrn: NOT DETECTED
Cannabinoid 50 Ng, Ur ~~LOC~~: NOT DETECTED
Cocaine Metabolite,Ur ~~LOC~~: NOT DETECTED
MDMA (Ecstasy)Ur Screen: NOT DETECTED
Methadone Scn, Ur: NOT DETECTED
Opiate, Ur Screen: POSITIVE — AB
Phencyclidine (PCP) Ur S: NOT DETECTED
Tricyclic, Ur Screen: POSITIVE — AB

## 2023-11-19 LAB — CBC WITH DIFFERENTIAL/PLATELET
Abs Immature Granulocytes: 0.02 10*3/uL (ref 0.00–0.07)
Basophils Absolute: 0 10*3/uL (ref 0.0–0.1)
Basophils Relative: 0 %
Eosinophils Absolute: 0.1 10*3/uL (ref 0.0–0.5)
Eosinophils Relative: 2 %
HCT: 36.1 % — ABNORMAL LOW (ref 39.0–52.0)
Hemoglobin: 11.3 g/dL — ABNORMAL LOW (ref 13.0–17.0)
Immature Granulocytes: 0 %
Lymphocytes Relative: 15 %
Lymphs Abs: 0.8 10*3/uL (ref 0.7–4.0)
MCH: 25.5 pg — ABNORMAL LOW (ref 26.0–34.0)
MCHC: 31.3 g/dL (ref 30.0–36.0)
MCV: 81.5 fL (ref 80.0–100.0)
Monocytes Absolute: 0.7 10*3/uL (ref 0.1–1.0)
Monocytes Relative: 14 %
Neutro Abs: 3.3 10*3/uL (ref 1.7–7.7)
Neutrophils Relative %: 69 %
Platelets: 199 10*3/uL (ref 150–400)
RBC: 4.43 MIL/uL (ref 4.22–5.81)
RDW: 15.1 % (ref 11.5–15.5)
WBC: 4.9 10*3/uL (ref 4.0–10.5)
nRBC: 0 % (ref 0.0–0.2)

## 2023-11-19 LAB — COMPREHENSIVE METABOLIC PANEL WITH GFR
ALT: 20 U/L (ref 0–44)
AST: 26 U/L (ref 15–41)
Albumin: 3.6 g/dL (ref 3.5–5.0)
Alkaline Phosphatase: 58 U/L (ref 38–126)
Anion gap: 9 (ref 5–15)
BUN: 20 mg/dL (ref 8–23)
CO2: 26 mmol/L (ref 22–32)
Calcium: 8.6 mg/dL — ABNORMAL LOW (ref 8.9–10.3)
Chloride: 101 mmol/L (ref 98–111)
Creatinine, Ser: 1.06 mg/dL (ref 0.61–1.24)
GFR, Estimated: >60 mL/min (ref >60)
Glucose, Bld: 127 mg/dL — ABNORMAL HIGH (ref 70–99)
Potassium: 3.7 mmol/L (ref 3.5–5.1)
Sodium: 136 mmol/L (ref 135–145)
Total Bilirubin: 0.6 mg/dL (ref 0.0–1.2)
Total Protein: 6.3 g/dL — ABNORMAL LOW (ref 6.5–8.1)

## 2023-11-19 LAB — ETHANOL: Alcohol, Ethyl (B): <15 mg/dL (ref <15)

## 2023-11-19 LAB — URINALYSIS, W/ REFLEX TO CULTURE (INFECTION SUSPECTED)
Bilirubin Urine: NEGATIVE
Glucose, UA: NEGATIVE mg/dL
Hgb urine dipstick: NEGATIVE
Ketones, ur: NEGATIVE mg/dL
Leukocytes,Ua: NEGATIVE
Nitrite: NEGATIVE
Protein, ur: NEGATIVE mg/dL
Specific Gravity, Urine: 1.011 (ref 1.005–1.030)
Squamous Epithelial / HPF: 0 /HPF (ref 0–5)
pH: 7 (ref 5.0–8.0)

## 2023-11-19 LAB — CBG MONITORING, ED: Glucose-Capillary: 116 mg/dL — ABNORMAL HIGH (ref 70–99)

## 2023-11-19 LAB — MAGNESIUM: Magnesium: 2.2 mg/dL (ref 1.7–2.4)

## 2023-11-19 LAB — TROPONIN I (HIGH SENSITIVITY): Troponin I (High Sensitivity): 3 ng/L (ref <18)

## 2023-11-19 LAB — APTT: aPTT: 34 s (ref 24–36)

## 2023-11-19 LAB — PROTIME-INR
INR: 1 (ref 0.8–1.2)
Prothrombin Time: 14.1 s (ref 11.4–15.2)

## 2023-11-19 MED ORDER — ENOXAPARIN SODIUM 40 MG/0.4ML IJ SOSY: 40.0000 mg | PREFILLED_SYRINGE | INTRAMUSCULAR | Status: DC

## 2023-11-19 MED ORDER — MECLIZINE HCL 25 MG PO TABS
25.0000 mg | ORAL_TABLET | Freq: Once | ORAL | Status: AC
  Administered 2023-11-19: 25 mg via ORAL
  Filled 2023-11-19: qty 1

## 2023-11-19 MED ORDER — SODIUM CHLORIDE 0.9 % IV BOLUS
500.0000 mL | Freq: Once | INTRAVENOUS | Status: AC
  Administered 2023-11-19: 500 mL via INTRAVENOUS

## 2023-11-19 MED ORDER — HYDROCODONE-ACETAMINOPHEN 5-325 MG PO TABS: 1.0000 | ORAL_TABLET | ORAL | Status: DC | PRN

## 2023-11-19 MED ORDER — IOHEXOL 350 MG/ML SOLN
75.0000 mL | Freq: Once | INTRAVENOUS | Status: AC | PRN
  Administered 2023-11-19: 75 mL via INTRAVENOUS

## 2023-11-19 MED ORDER — ASPIRIN 81 MG PO TBEC
81.0000 mg | DELAYED_RELEASE_TABLET | Freq: Every day | ORAL | Status: DC
  Administered 2023-11-20: 81 mg via ORAL
  Filled 2023-11-19: qty 1

## 2023-11-19 MED ORDER — FLUTICASONE PROPIONATE 50 MCG/ACT NA SUSP: 2.0000 | Freq: Every day | NASAL | Status: DC | PRN

## 2023-11-19 MED ORDER — GABAPENTIN 300 MG PO CAPS
300.0000 mg | ORAL_CAPSULE | Freq: Three times a day (TID) | ORAL | Status: DC
  Administered 2023-11-19 – 2023-11-20 (×3): 300 mg via ORAL
  Filled 2023-11-19 (×3): qty 1

## 2023-11-19 MED ORDER — ASPIRIN 81 MG PO CHEW
324.0000 mg | CHEWABLE_TABLET | Freq: Once | ORAL | Status: AC
  Administered 2023-11-19: 324 mg via ORAL
  Filled 2023-11-19: qty 4

## 2023-11-19 MED ORDER — ZOLPIDEM TARTRATE 5 MG PO TABS
5.0000 mg | ORAL_TABLET | Freq: Every evening | ORAL | Status: DC | PRN
  Administered 2023-11-19: 5 mg via ORAL
  Filled 2023-11-19: qty 1

## 2023-11-19 MED ORDER — CLOPIDOGREL BISULFATE 75 MG PO TABS
75.0000 mg | ORAL_TABLET | Freq: Every day | ORAL | Status: DC
  Filled 2023-11-19 (×2): qty 1

## 2023-11-19 NOTE — ED Notes (Signed)
 Pt ambulated to bathroom with a steady gait, Pt is more alert and responsive. Pt reporting improving dizziness/fatigue. Family @bedside 

## 2023-11-19 NOTE — ED Triage Notes (Signed)
 BIBEMS, coming from home. c/o sudden N/V and generalized weakness. HRD84. Denies CP, SOB, dizziness. VSS BGL: 110

## 2023-11-19 NOTE — ED Notes (Signed)
 Called [facility] Deb[facility] activated code stroke   1202

## 2023-11-19 NOTE — ED Provider Notes (Signed)
 [facility] Provider Note    Event Date/Time   First MD Initiated Contact with Patient 11/19/23 1123     (approximate)   History   Weakness and Nausea   HPI  Seth Doyle is a 68 y.o. male past medical history significant for hypertension, hyperlipidemia, who presents to the emergency department for not feeling well.  States that he was in his normal state of health this morning when he got up at approximately 5 AM.  States that he started to feel very tired and fatigued around 630 to 7 AM.  Went to work and then started to feel worse with episode of nausea and vomiting and was feeling generalized weakness.  His brother who he works with took him back home and his wife noted that he was walking abnormal with a staggering gait.  Denies any significant alcohol or drug use.  Denies any recent falls or head trauma.  Not on anticoagulation.  1 prior episode of dizziness and room spinning approximately 3 weeks ago that resolved on its own, primary care provider recommended an MRI at that time however he had not yet scheduled this procedure given the cost.  Denies chest pain or shortness of breath.  No fever or chills.  No dysuria, urinary urgency or frequency.  No blood in his stool.     Physical Exam   Triage Vital Signs: ED Triage Vitals  Encounter Vitals Group     BP      Girls Systolic BP Percentile      Girls Diastolic BP Percentile      Boys Systolic BP Percentile      Boys Diastolic BP Percentile      Pulse      Resp      Temp      Temp src      SpO2      Weight      Height      Head Circumference      Peak Flow      Pain Score      Pain Loc      Pain Education      Exclude from Growth Chart     Most recent vital signs: Vitals:   11/19/23 1136  BP: 130/79  Pulse: 83  Resp: 16  Temp: 97.7 F (36.5 C)  SpO2: 98%    Physical Exam Constitutional:      Appearance: He is well-developed.  HENT:     Head: Atraumatic.   Eyes:      Conjunctiva/sclera: Conjunctivae normal.    Cardiovascular:     Rate and Rhythm: Regular rhythm.  Pulmonary:     Effort: No respiratory distress.   Musculoskeletal:     Cervical back: Normal range of motion.   Skin:    General: Skin is warm.   Neurological:     Mental Status: He is alert. Mental status is at baseline.     GCS: GCS eye subscore is 4. GCS verbal subscore is 5. GCS motor subscore is 6.     Cranial Nerves: Cranial nerves 2-12 are intact.     Sensory: Sensation is intact.     Motor: Motor function is intact.     Gait: Gait abnormal.     Comments: Normal finger-nose.  No nystagmus.    IMPRESSION / MDM / ASSESSMENT AND PLAN / ED COURSE  I reviewed the triage vital signs and the nursing notes.   Vertiginous symptoms started at approximately  915 this morning.  Activated code stroke for TNK consideration.  Did have symptoms of fatigue but no neurologic deficits that started approximately 2 hours prior to that.  Dr. Matthews with neurology is at bedside.  Taken immediately to CT scan of head and CTA head and neck.    EKG  I, Clotilda Punter, the attending physician, personally viewed and interpreted this ECG.   Rate: Normal  Rhythm: Normal sinus  Axis: Normal  Intervals: Normal  ST&T Change: None  No tachycardic or bradycardic dysrhythmias while on cardiac telemetry.  RADIOLOGY I independently reviewed imaging, my interpretation of imaging: CT scan of the head -no signs of intracranial hemorrhage  CTA ordered  -no large vessel cutoff no dissection  LABS (all labs ordered are listed, but only abnormal results are displayed) Labs interpreted as -    Labs Reviewed  CBC WITH DIFFERENTIAL/PLATELET - Abnormal; Notable for the following components:      Result Value   Hemoglobin 11.3 (*)    HCT 36.1 (*)    MCH 25.5 (*)    All other components within normal limits  COMPREHENSIVE METABOLIC PANEL WITH GFR - Abnormal; Notable for the following components:    Glucose, Bld 127 (*)    Calcium 8.6 (*)    Total Protein 6.3 (*)    All other components within normal limits  URINALYSIS, W/ REFLEX TO CULTURE (INFECTION SUSPECTED) - Abnormal; Notable for the following components:   Color, Urine STRAW (*)    APPearance CLEAR (*)    Bacteria, UA RARE (*)    All other components within normal limits  URINE DRUG SCREEN, QUALITATIVE ([facility] ONLY) - Abnormal; Notable for the following components:   Tricyclic, Ur Screen POSITIVE (*)    Opiate, Ur Screen POSITIVE (*)    All other components within normal limits  MAGNESIUM   ETHANOL  PROTIME-INR  APTT  TROPONIN I (HIGH SENSITIVITY)     MDM   Clinical Course as of 11/19/23 1351  Tue Nov 19, 2023  1202 Evaluation patient initially complaining of generalized weakness.  On my evaluation patient with room spinning dizziness even at rest.  Attempted to ambulate with significant gait instability.  No dysmetria with finger-nose and no nystagmus on my exam.  Activated code stroke.  Last known well at 915 this morning. [SM]  1229 Patient was evaluated by Dr. Matthews.  Offered TNK however patient declined given his current symptoms.  Recommended observation in the ER until 130 and if no progression of symptoms at that time recommended admission for MRI and stroke workup. [SM]  1350 On reevaluation continues to be dizzy but does have some improvement of his symptoms.  Is outside of the window for TNK.  Consulted hospitalist for admission.  Given aspirin and meclizine.  Lab work overall reassuring with mild anemia, no leukocytosis and no obvious infectious process.  UDS is positive for tricyclics and opioids.  Alcohol levels negative. [SM]    Clinical Course User Index [SM] Punter Clotilda, MD     PROCEDURES:  Critical Care performed: yes  .Critical Care  Performed by: Punter Clotilda, MD Authorized by: Punter Clotilda, MD   Critical care provider statement:    Critical care time (minutes):  30   Critical care time  was exclusive of:  Separately billable procedures and treating other patients   Critical care was necessary to treat or prevent imminent or life-threatening deterioration of the following conditions:  CNS failure or compromise   Critical care was time spent personally  by me on the following activities:  Development of treatment plan with patient or surrogate, discussions with consultants, evaluation of patient's response to treatment, examination of patient, ordering and review of laboratory studies, ordering and review of radiographic studies, ordering and performing treatments and interventions, pulse oximetry, re-evaluation of patient's condition and review of old charts   Care discussed with: admitting provider     Patient's presentation is most consistent with acute presentation with potential threat to life or bodily function.   MEDICATIONS ORDERED IN ED: Medications  aspirin chewable tablet 324 mg (has no administration in time range)  meclizine (ANTIVERT) tablet 25 mg (has no administration in time range)  sodium chloride  0.9 % bolus 500 mL (0 mLs Intravenous Stopped 11/19/23 1341)  iohexol  (OMNIPAQUE ) 350 MG/ML injection 75 mL (75 mLs Intravenous Contrast Given 11/19/23 1222)    FINAL CLINICAL IMPRESSION(S) / ED DIAGNOSES   Final diagnoses:  Vertigo  Weakness     Rx / DC Orders   ED Discharge Orders     None        Note:  This document was prepared using Dragon voice recognition software and may include unintentional dictation errors.   Suzanne Kirsch, MD 11/19/23 1351

## 2023-11-19 NOTE — H&P (Signed)
 History and Physical    Seth Doyle FMW:[id number] DOB: May 19, 1956 DOA: 11/19/2023  PCP: Marylynn Verneita CROME, MD  Patient coming from: home   Chief Complaint: vertigo  HPI: Seth Doyle is a 68 y.o. male with medical history significant for htn who presents with the above complaint.  Two weeks ago suffered transient (less than a minute) sensation of difficulty using left leg, left arm paresthesia, and cold sensation left side of face. Then today at work felt very tired. Used the restroom and had difficulty standing he felt so tired. Sat down and closed his eyes and experienced room spinning sensation. No focal weakness or numbness. Promptly felt nauseaus and vomited (nbnb). No abd pain or diarrhea. No nausea or vomiting since. Continues to feel vertigo when closes his eyes, has had vertigo in the past but this is a different quality.    Review of Systems: As per HPI otherwise 10 point review of systems negative.    Past Medical History:  Diagnosis Date   Actinic keratosis    Chronic insomnia 03/26/2014   Managed with generic ambien .      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 no longer on medicines.   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  last one approx 6 mos ago   Wears contact lenses     Past Surgical History:  Procedure Laterality Date   COLON SURGERY  August 2012   sigmoid colectomy, diverticulitis   COLONOSCOPY WITH  PROPOFOL  N/A 05/06/2015   Procedure: COLONOSCOPY WITH PROPOFOL ;  Surgeon: Rogelia Copping, MD;  Location: [facility];  Service: Endoscopy;  Laterality: N/A;   COLONOSCOPY WITH PROPOFOL  N/A 08/07/2019   Procedure: COLONOSCOPY WITH PROPOFOL ;  Surgeon: Copping Rogelia, MD;  Location: [facility];  Service: Endoscopy;  Laterality: N/A;   EYE SURGERY     tear duct repair    POLYPECTOMY  05/06/2015   Procedure: POLYPECTOMY;  Surgeon: Rogelia Copping, MD;  Location: [facility];  Service: Endoscopy;;   RHINOPLASTY  05/21/1990   deviated septum due to volleyball   RHINOPLASTY  05/21/1990   foe deviated septm, traumatic  [place]    ROTATOR CUFF REPAIR  05/21/1989   Dr. Mardee   SHOULDER ARTHROSCOPY W/ ROTATOR CUFF REPAIR  05/21/1989   Hooten   SMALL INTESTINE SURGERY  2012     reports that he has never smoked. He has never been exposed to tobacco smoke. He has never used smokeless tobacco. He reports current alcohol use. He reports that he does not use drugs.  Allergies  Allergen Reactions   Wellbutrin  [Bupropion ] Itching    Family History  Problem Relation Age of Onset   Diabetes Mother    Mental illness Mother        Dementia   COPD Father    Hyperlipidemia Father    Mental illness Father        Parkinson's Disease    Prior to Admission medications   Medication Sig Start Date  End Date Taking? Authorizing Provider  amLODipine  (NORVASC ) 2.5 MG tablet Take 1 tablet (2.5 mg total) by mouth daily. Patient taking differently: Take 2.5 mg by mouth every evening. 11/01/23  Yes Marylynn Verneita CROME, MD  clobetasol  (TEMOVATE ) 0.05 % external solution Patient to mix clobetasol  solution in jar of CeraVe Cream. Apply to itchy rash once to twice daily until improved. Avoid applying to face, groin, and axilla. Use as directed. Long-term use can cause thinning of the skin. 10/02/23  Yes Jackquline Sawyer, MD  clobetasol  cream (TEMOVATE ) 0.05 % Apply twice daily to affected areas on body as needed for  rash/itching. Avoid applying to face, groin, and axilla. 03/21/22  Yes Jackquline Sawyer, MD  gabapentin  (NEURONTIN ) 300 MG capsule Take 300 mg by mouth 3 (three) times daily.   Yes [provider]  HYDROcodone -acetaminophen  (NORCO/VICODIN) 5-325 MG tablet Take 1 tablet by mouth every 4 (four) hours as needed. 08/05/23  Yes [provider]  hydrOXYzine  (ATARAX ) 25 MG tablet Take 1 tablet at bedtime as needed for itching. 04/02/23  Yes Jackquline Sawyer, MD  tadalafil  (CIALIS ) 10 MG tablet Take 1 tablet (10 mg total) by mouth daily. Take 10 mg daily with a 10 mg boost dose as needed 45 minutes prior to sexual activity 08/20/23  Yes Francisca Redell BROCKS, MD  zolpidem  (AMBIEN ) 10 MG tablet TAKE A HALF TABLET BY MOUTH EVERY NIGHT AT BEDTIME AS NEEDED FOR SLEEP 09/16/23  Yes Marylynn Verneita CROME, MD  Ascorbic Acid (VITAMIN C) 100 MG tablet Take 100 mg by mouth daily. Patient not taking: Reported on 11/19/2023    [provider]  cyclobenzaprine  (FLEXERIL ) 10 MG tablet Take 1 tablet (10 mg total) by mouth 3 (three) times daily as needed for muscle spasms. Patient not taking: Reported on 11/19/2023 12/21/22   Kaur, Charanpreet, NP  gabapentin  (NEURONTIN ) 100 MG capsule Take 1 capsule (100 mg total) by mouth 2 (two) times daily. Patient not taking: Reported on 11/19/2023 12/21/22   Kaur, Charanpreet, NP  meloxicam  (MOBIC ) 15 MG tablet Take 1 tablet every day by oral route for 30 days. Patient not taking: Reported on 11/19/2023 07/02/22   [provider]    Physical Exam: Vitals:   11/19/23 1135 11/19/23 1136  BP:  130/79  Pulse:  83  Resp:  16  Temp:  97.7 F (36.5 C)  TempSrc:  Oral  SpO2:  98%  Weight: 83 kg   Height: 5' 11 (1.803 m)     Constitutional: No acute distress Head: Atraumatic Eyes: Conjunctiva clear ENM: Moist mucous membranes. Normal dentition.  Neck: Supple Respiratory: Clear to auscultation bilaterally, no wheezing/rales/rhonchi. Normal respiratory effort. No accessory  muscle use. . Cardiovascular: Regular rate and rhythm. No murmurs/rubs/gallops. Abdomen: Non-tender, non-distended. No masses. No rebound or guarding. Positive bowel sounds. Musculoskeletal: No joint deformity upper and lower extremities. Normal ROM, no contractures. Normal muscle tone.  Skin: No rashes, lesions, or ulcers.  Extremities: No peripheral edema. Palpable peripheral pulses. Neurologic: Alert, moving all 4 extremities. Cn 2-12 grossly intact Psychiatric: Normal insight and judgement.   Labs on Admission: I have personally reviewed following labs and imaging studies  CBC: Recent Labs  Lab 11/19/23 1155  WBC 4.9  NEUTROABS 3.3  HGB 11.3*  HCT 36.1*  MCV 81.5  PLT 199   Basic Metabolic Panel: Recent Labs  Lab 11/19/23 1155  NA 136  K 3.7  CL 101  CO2 26  GLUCOSE 127*  BUN 20  CREATININE 1.06  CALCIUM 8.6*  MG 2.2   GFR: Estimated Creatinine Clearance: 73 mL/min (by C-G formula based on SCr of 1.06 mg/dL). Liver Function Tests: Recent Labs  Lab 11/19/23 1155  AST 26  ALT 20  ALKPHOS 58  BILITOT 0.6  PROT 6.3*  ALBUMIN 3.6   No results for input(s): LIPASE, AMYLASE in the last 168 hours. No results for input(s): AMMONIA in the last 168 hours. Coagulation Profile: Recent Labs  Lab 11/19/23 1250  INR 1.0   Cardiac Enzymes: No results for input(s): CKTOTAL, CKMB, CKMBINDEX, TROPONINI in the last 168 hours. BNP (last 3 results) No results for input(s): PROBNP in the last 8760 hours. HbA1C: No results for input(s): HGBA1C in the last 72 hours. CBG: Recent Labs  Lab 11/19/23 1212  GLUCAP 116*   Lipid Profile: No results for input(s): CHOL, HDL, LDLCALC, TRIG, CHOLHDL, LDLDIRECT in the last 72 hours. Thyroid  Function Tests: No results for input(s): TSH, T4TOTAL, FREET4, T3FREE, THYROIDAB in the last 72 hours. Anemia Panel: No results for input(s): VITAMINB12, FOLATE, FERRITIN, TIBC, IRON,  RETICCTPCT in the last 72 hours. Urine analysis:    Component Value Date/Time   COLORURINE STRAW (A) 11/19/2023 1250   APPEARANCEUR CLEAR (A) 11/19/2023 1250   LABSPEC 1.011 11/19/2023 1250   PHURINE 7.0 11/19/2023 1250   GLUCOSEU NEGATIVE 11/19/2023 1250   GLUCOSEU NEGATIVE 07/11/2020 1438   HGBUR NEGATIVE 11/19/2023 1250   BILIRUBINUR NEGATIVE 11/19/2023 1250   BILIRUBINUR negative 03/01/2016 1522   KETONESUR NEGATIVE 11/19/2023 1250   PROTEINUR NEGATIVE 11/19/2023 1250   UROBILINOGEN 0.2 07/11/2020 1438   NITRITE NEGATIVE 11/19/2023 1250   LEUKOCYTESUR NEGATIVE 11/19/2023 1250    Radiological Exams on Admission: MR BRAIN WO CONTRAST Result Date: 11/19/2023 CLINICAL DATA:  Neuro deficit, acute, stroke suspected EXAM: MRI HEAD WITHOUT CONTRAST TECHNIQUE: Multiplanar, multiecho pulse sequences of the brain and surrounding structures were obtained without intravenous contrast. COMPARISON:  CT of the head dated November 19, 2023. FINDINGS: Brain: Age-related cerebral atrophy. Mild periventricular and subcortical white matter disease. No evidence of hemorrhage, mass, cortical infarct or hydrocephalus. No restricted diffusion to indicate acute or recent infarction. Vascular: Normal vascular flow voids. Skull and upper cervical spine: Normal bone marrow signal. No lesions evident. Sinuses/Orbits: No acute process. Other: None. IMPRESSION: 1. Mild nonspecific cerebral white matter disease. No apparent acute process. Electronically Signed   By: Evalene Coho M.D.   On: 11/19/2023 14:55   CT Angio Head Neck W WO CM (CODE STROKE) Result Date: 11/19/2023 CLINICAL DATA:  Provided history: Neuro deficit, acute, stroke suspected. Additional history obtained from electronic MEDICAL RECORD NUMBERSudden onset nausea and vomiting, generalized weakness, dizziness. EXAM: CT ANGIOGRAPHY HEAD AND NECK WITH AND WITHOUT CONTRAST TECHNIQUE: Multidetector CT imaging of the head and neck was performed using the standard  protocol during bolus administration of intravenous contrast. Multiplanar CT image reconstructions and MIPs were obtained to evaluate the vascular anatomy. Carotid stenosis measurements (when applicable) are obtained utilizing NASCET criteria, using the distal internal carotid diameter as the denominator. RADIATION DOSE REDUCTION: This exam was performed according to the departmental dose-optimization program which includes automated exposure control, adjustment of the mA and/or kV according to patient size and/or use of iterative reconstruction technique. CONTRAST:  75mL OMNIPAQUE  IOHEXOL  350 MG/ML SOLN COMPARISON:  Non-contrast head CT performed earlier today 11/19/2023. FINDINGS: CTA NECK FINDINGS Aortic arch: The left vertebral artery arises directly from the aortic arch. Mild atherosclerotic plaque within the visible thoracic aorta and proximal major branch vessels of the neck. Streak/beam hardening  artifact arising from a dense contrast bolus partially obscures the right subclavian artery. Within this limitation, there is no appreciable hemodynamically significant innominate or proximal subclavian artery stenosis. Right carotid system: CCA and ICA patent within the neck without stenosis. Mild atherosclerotic plaque about the carotid bifurcation. Mild tortuosity of the distal cervical ICA. Left carotid system: CCA and ICA patent within the neck without stenosis or significant atherosclerotic disease. Vertebral arteries: Patent within the neck without stenosis or significant atherosclerotic disease. The right vertebral artery is dominant. Skeleton: Nonspecific reversal of the expected cervical lordosis. Mild grade 1 anterolisthesis at C2-C3, C7-T1, T2-T3 and T3-T4. No acute fracture or aggressive osseous lesion. Other neck: No neck mass or cervical lymphadenopathy. Upper chest: No consolidation within the imaged lung apices. Review of the MIP images confirms the above findings CTA HEAD FINDINGS Anterior  circulation: The intracranial internal carotid arteries are patent. The M1 middle cerebral arteries are patent. No M2 proximal branch occlusion or high-grade proximal stenosis. The anterior cerebral arteries are patent. No intracranial aneurysm is identified. Posterior circulation: The intracranial vertebral arteries are patent. The basilar artery is patent. The posterior cerebral arteries are patent. The right PCA is fetal in origin. The left posterior communicating artery is diminutive or absent. Venous sinuses: Within the limitations of contrast timing, no convincing thrombus. Anatomic variants: As described. Review of the MIP images confirms the above findings No proximal large vessel occlusion identified. These results were communicated to Dr. Matthews at 12:51 pmon 7/1/2025by text page via the [facility] messaging system. IMPRESSION: CTA neck: 1. The common carotid, internal carotid and vertebral arteries are patent within the neck without stenosis. Mild atherosclerotic plaque about the right carotid bifurcation. 2. Aortic Atherosclerosis (ICD10-I70.0). CTA head: No proximal intracranial large vessel occlusion or high-grade proximal arterial stenosis identified. Electronically Signed   By: Rockey Childs D.O.   On: 11/19/2023 12:51   CT HEAD CODE STROKE WO CONTRAST Result Date: 11/19/2023 CLINICAL DATA:  Code stroke.  Acute neurological deficit. EXAM: CT HEAD WITHOUT CONTRAST TECHNIQUE: Contiguous axial images were obtained from the base of the skull through the vertex without intravenous contrast. RADIATION DOSE REDUCTION: This exam was performed according to the departmental dose-optimization program which includes automated exposure control, adjustment of the mA and/or kV according to patient size and/or use of iterative reconstruction technique. COMPARISON:  None Available. FINDINGS: Brain: Age-related cerebral volume loss. No evidence of hemorrhage, mass, cortical infarct or hydrocephalus. Vascular: Negative.  Skull: Intact and unremarkable. Sinuses/Orbits: Negative. Other: None. IMPRESSION: 1. Age-related cerebral volume loss.  Negative. 2. ASPECTS is 10. 3. These results were paged at the time of interpretation on 11/19/2023 at 12:16 pm to provider Dr. Matthews. Electronically Signed   By: Evalene Coho M.D.   On: 11/19/2023 12:18    EKG: Independently reviewed. nsr  Assessment/Plan Principal Problem:   TIA (transient ischemic attack) Active Problems:   Intestinal bypass or anastomosis status   Essential hypertension   Melanoma of skin (HCC)   # Vertigo # TIA CT head, CT angio head/neck, MRI brain all negative. Seen by neurolgy who advises TIA w/u. Is on a number of meds that could cause or contribute to vertigo (norco, gabapentin , flexeril , zolpidem ) - tele - A1c, lipids - asa/plavix, statin if ldl above 70 - hold home amlodipine  for permissive htn - pt/ot, holding on slp eval as denies any swallowing or speaking difficulty - echocardiogram  # HTN Bp appropriate - amlodipine  on hold as above  # Low back pain Followed by  neurosurg outpt, has procedure scheduled next week - home gabapentin , norco  DVT prophylaxis: lovenox Code Status: full  Family Communication: wife updated @ bedside  Consults called: neurology   Level of care: Telemetry Cardiac Status is: Observation    Devaughn KATHEE Ban MD [facility] Pager [phone]  If 7PM-7AM, please contact night-coverage [email or web address] Password [email or web address]  11/19/2023, 3:20 PM

## 2023-11-19 NOTE — Consult Note (Signed)
 NEUROLOGY CONSULT NOTE   Date of service: November 19, 2023 Patient Name: Seth Doyle MRN:  [id number] DOB:  [birth year] Chief Complaint: dizziness Requesting Provider: Suzanne Kirsch, MD  History of Present Illness  Seth Doyle is a 68 y.o. male with hx of BPPV who presents with dizziness.  He states that this morning he woke up and went to work at approximately 615 and was feeling normal at that time except for more lethargic than usual.  He had increasing difficulty staying awake and had shakiness in his legs.  He also reports that he had dizziness described as feeling like he was moving when he was not that started at 0915.  He has a history of episodic vertigo in the past that was triggered by changes in head position but that was associated with the true feeling of room spinning which she does not have today.  NIH stroke scale was 0.  Head CT showed no acute process.  Patient had a very difficult time explaining how the dizziness felt and MRI brain was considered for further clarification prior to treatment decision for TNK.  However when patient was asked if he would consider TNK with the attendant risks he stated that he would not with his current level of symptoms therefore MRI brain was deferred.  He was observed in the ED until he was outside of the window with no worsening of his deficits.  CTA showed no LVO.  LKW: 0915 Modified rankin score: 0-Completely asymptomatic and back to baseline post- stroke IV Thrombolysis: no, patient agreed sx too mild to treat  NIHSS = 0     ROS   Comprehensive ROS performed and pertinent positives documented in HPI   Past History   Past Medical History:  Diagnosis Date   Actinic keratosis    Chronic insomnia 03/26/2014   Managed with generic ambien .      CMC arthritis, thumb, degenerative 03/27/2013   Constipation 02/22/2015   Diverticulitis    Diverticulitis of sigmoid colon 02/04/2011   Essential hypertension 04/27/2014   related to  event. no longer on medicines.   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  last one approx 6 mos ago   Wears contact lenses     Past Surgical History:  Procedure Laterality Date   COLON SURGERY  August 2012   sigmoid colectomy, diverticulitis   COLONOSCOPY WITH PROPOFOL  N/A 05/06/2015   Procedure: COLONOSCOPY WITH PROPOFOL ;  Surgeon: Rogelia Copping, MD;  Location[facility];  Service: Endoscopy;  Laterality: N/A;   COLONOSCOPY WITH PROPOFOL  N/A 08/07/2019   Procedure: COLONOSCOPY WITH PROPOFOL ;  Surgeon: Copping Rogelia, MD;  Location: [facility];  Service: Endoscopy;  Laterality: N/A;   EYE SURGERY     tear duct repair    POLYPECTOMY  05/06/2015   Procedure: POLYPECTOMY;  Surgeon: Rogelia Copping, MD;  Location: [facility];  Service: Endoscopy;;   RHINOPLASTY  05/21/1990   deviated septum due to volleyball   RHINOPLASTY  05/21/1990   foe deviated septm, traumatic  [place]    ROTATOR CUFF REPAIR  05/21/1989   Dr. Hooten  SHOULDER ARTHROSCOPY W/ ROTATOR CUFF REPAIR  05/21/1989   Hooten   SMALL INTESTINE SURGERY  2012    Family History: Family History  Problem Relation Age of Onset   Diabetes Mother    Mental illness Mother        Dementia   COPD Father    Hyperlipidemia Father    Mental illness Father        Parkinson's Disease    Social History  reports that he has never smoked. He has never been exposed to tobacco smoke. He has never used smokeless tobacco. He reports current alcohol use. He reports that he does not use drugs.  Allergies  Allergen Reactions    Wellbutrin  [Bupropion ] Itching    Medications  No current facility-administered medications for this encounter.  Current Outpatient Medications:    amLODipine  (NORVASC ) 2.5 MG tablet, Take 1 tablet (2.5 mg total) by mouth daily., Disp: 90 tablet, Rfl: 1   Ascorbic Acid (VITAMIN C) 100 MG tablet, Take 100 mg by mouth daily., Disp: , Rfl:    clobetasol  (TEMOVATE ) 0.05 % external solution, Patient to mix clobetasol  solution in jar of CeraVe Cream. Apply to itchy rash once to twice daily until improved. Avoid applying to face, groin, and axilla. Use as directed. Long-term use can cause thinning of the skin., Disp: 50 mL, Rfl: 3   clobetasol  cream (TEMOVATE ) 0.05 %, Apply twice daily to affected areas on body as needed for rash/itching. Avoid applying to face, groin, and axilla., Disp: 30 g, Rfl: 2   cyclobenzaprine  (FLEXERIL ) 10 MG tablet, Take 1 tablet (10 mg total) by mouth 3 (three) times daily as needed for muscle spasms., Disp: 30 tablet, Rfl: 0   gabapentin  (NEURONTIN ) 100 MG capsule, Take 1 capsule (100 mg total) by mouth 2 (two) times daily., Disp: 30 capsule, Rfl: 1   HYDROcodone -acetaminophen  (NORCO/VICODIN) 5-325 MG tablet, Take 1 tablet by mouth every 4 (four) hours as needed., Disp: , Rfl:    hydrOXYzine  (ATARAX ) 25 MG tablet, Take 1 tablet at bedtime as needed for itching., Disp: 30 tablet, Rfl: 3   meloxicam  (MOBIC ) 15 MG tablet, Take 1 tablet every day by oral route for 30 days., Disp: , Rfl:    tadalafil  (CIALIS ) 10 MG tablet, Take 1 tablet (10 mg total) by mouth daily. Take 10 mg daily with a 10 mg boost dose as needed 45 minutes prior to sexual activity, Disp: 30 tablet, Rfl: 11   zolpidem  (AMBIEN ) 10 MG tablet, TAKE A HALF TABLET BY MOUTH EVERY NIGHT AT BEDTIME AS NEEDED FOR SLEEP, Disp: 30 tablet, Rfl: 5  Vitals   Vitals:   02-Dec-2023 1135 12-02-23 1136  BP:  130/79  Pulse:  83  Resp:  16  Temp:  97.7 F (36.5 C)  TempSrc:  Oral  SpO2:  98%  Weight: 83 kg   Height: 5'  11 (1.803 m)     Body mass index is 25.52 kg/m.   Physical Exam   Gen: patient lying in bed, NAD CV: extremities appear well-perfused Resp: normal WOB  Neurologic exam MS: alert, oriented x4, follows commands Speech: no dysarthria, no aphasia CN: PERRL, VFF, EOMI, sensation intact, face symmetric, hearing intact to voice Motor: 5/5 strength throughout Sensory: SILT Reflexes: 2+ symm with toes down bilat Coordination: FNF intact bilat Gait: deferred   Labs/Imaging/Neurodiagnostic studies   CBC:  Recent Labs  Lab 2023-12-02 1155  WBC 4.9  NEUTROABS 3.3  HGB 11.3*  HCT 36.1*  MCV 81.5  PLT  199   Basic Metabolic Panel:  Lab Results  Component Value Date   NA 136 11/19/2023   K 3.7 11/19/2023   CO2 26 11/19/2023   GLUCOSE 127 (H) 11/19/2023   BUN 20 11/19/2023   CREATININE 1.06 11/19/2023   CALCIUM 8.6 (L) 11/19/2023   GFRNONAA >60 11/19/2023   GFRAA >60 07/07/2017   Lipid Panel:  Lab Results  Component Value Date   LDLCALC 61 08/02/2023   HgbA1c:  Lab Results  Component Value Date   HGBA1C 5.6 08/02/2023   Urine Drug Screen: No results found for: LABOPIA, COCAINSCRNUR, LABBENZ, AMPHETMU, THCU, LABBARB  Alcohol Level No results found for: ETH INR No results found for: INR APTT No results found for: APTT AED levels: No results found for: PHENYTOIN, ZONISAMIDE, LAMOTRIGINE, LEVETIRACETA  CT Head without contrast(Personally reviewed): No acute process  CT angio Head and Neck with contrast(Personally reviewed): No LVO  ASSESSMENT   Seth Doyle is a 68 y.o. male with hx of BPPV who presents with dizziness different from his prior episodes of vertigo c/f TIA or ischemic stroke. Patient agreed that his sx were too mild to treat  with TNK. He also reports an episode 2 weeks ago where he had unilateral numbness on one side that was transient and resolved after 15 min. Both events warrant admission for stroke/TIA  workup.  RECOMMENDATIONS   - Admit for stroke workup - Permissive HTN x48 hrs from sx onset or until stroke ruled out by MRI goal BP <220/120. PRN labetalol or hydralazine if BP above these parameters. Avoid oral antihypertensives. - MRI brain wo contrast - CTA/MRA if not already obtained - TTE w/ bubble - Check A1c and LDL + add statin per guidelines - ASA 81mg  daily + plavix 75mg  daily x21 days f/b ASA 81mg  daily monotherapy after that - q4 hr neuro checks - STAT head CT for any change in neuro exam - Tele - PT/OT/SLP - Stroke education - Amb referral to neurology upon discharge   Will continue to follow  ______________________________________________________________________    Signed, Elida CHRISTELLA Ross, MD [facility]

## 2023-11-19 NOTE — ED Notes (Signed)
 NIHSS were deferred as Pt was in MRI

## 2023-11-19 NOTE — ED Notes (Signed)
 Pt ambulatory to restroom and back to room independently.

## 2023-11-19 NOTE — Code Documentation (Signed)
 CODE STROKE- PHARMACY COMMUNICATION   Time CODE STROKE called/page received:1205  Time response to CODE STROKE was made (in person or via phone): 1208  Time Stroke Kit retrieved from [employer] (only if needed):N/A  Name of Provider/Nurse contacted:Dr. Matthews and Burnard Bras  Past Medical History:  Diagnosis Date   Actinic keratosis    Chronic insomnia 03/26/2014   Managed with generic ambien .      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 no longer on medicines.   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  last one approx 6 mos ago   Wears contact lenses    Prior to Admission medications   Medication Sig Start Date End Date Taking? Authorizing Provider  amLODipine  (NORVASC ) 2.5 MG tablet Take 1 tablet (2.5 mg total) by mouth daily. 11/01/23   Marylynn Verneita CROME, MD  Ascorbic Acid (VITAMIN C) 100 MG tablet Take 100 mg by mouth daily.    [provider]  clobetasol  (TEMOVATE ) 0.05 % external solution Patient to mix clobetasol  solution in jar of CeraVe Cream. Apply to itchy rash once to twice daily until improved. Avoid applying to face, groin, and axilla. Use as directed. Long-term use can cause thinning of the skin. 10/02/23   Jackquline Sawyer, MD  clobetasol  cream (TEMOVATE ) 0.05 % Apply twice daily to affected areas on body as needed for rash/itching. Avoid applying to face, groin, and axilla. 03/21/22   Jackquline Sawyer, MD  cyclobenzaprine   (FLEXERIL ) 10 MG tablet Take 1 tablet (10 mg total) by mouth 3 (three) times daily as needed for muscle spasms. 12/21/22   Kaur, Charanpreet, NP  gabapentin  (NEURONTIN ) 100 MG capsule Take 1 capsule (100 mg total) by mouth 2 (two) times daily. 12/21/22   Kaur, Charanpreet, NP  HYDROcodone -acetaminophen  (NORCO/VICODIN) 5-325 MG tablet Take 1 tablet by mouth every 4 (four) hours as needed. 08/05/23   [provider]  hydrOXYzine  (ATARAX ) 25 MG tablet Take 1 tablet at bedtime as needed for itching. 04/02/23   Jackquline Sawyer, MD  meloxicam  (MOBIC ) 15 MG tablet Take 1 tablet every day by oral route for 30 days. 07/02/22   [provider]  tadalafil  (CIALIS ) 10 MG tablet Take 1 tablet (10 mg total) by mouth daily. Take 10 mg daily with a 10 mg boost dose as needed 45 minutes prior to sexual activity 08/20/23   Francisca Redell BROCKS, MD  zolpidem  (AMBIEN ) 10 MG tablet TAKE A HALF TABLET BY MOUTH EVERY NIGHT AT BEDTIME AS NEEDED FOR SLEEP 09/16/23   Marylynn Verneita CROME, MD    Kayla JULIANNA Blew ,PharmD Clinical Pharmacist  11/19/2023  12:33 PM

## 2023-11-19 NOTE — Code Documentation (Signed)
 Stroke Response Nurse Documentation Code Documentation  KABEER HOAGLAND is a 68 y.o. male arriving to [facility] via [facility] on 11/19/2023 with past medical hx of HTN, vertigo, GERD. On No antithrombotic. Code stroke was activated by ED.   Patient from home where he was LKW at 0900 and now complaining of dizziness. Patient reports waking up more tired than usual this morning, but otherwise normal. He went to work and around [id number], he reports sudden onset legs shaking, dizziness, and reports he couldn't stay awake. Was driven home and brought to the hospital. Reports hx of vertigo, but this dizziness feels different.   Stroke team at the bedside on patient arrival. Labs drawn and patient cleared for CT by Dr. Suzanne. Patient to CT with team. NIHSS 0, see documentation for details and code stroke times. Patient with NIHSS 0 on exam. The following imaging was completed:  CT Head and CTA. Patient is not a candidate for IV Thrombolytic due to too mild to treat, per MD. Patient is not a candidate for IR due to no LVO on imaging, per MD.   Care Plan: every 30 minute NIHSS until outside window at 1330. Reactivate code stroke if patient worsens. Swallow screen per protocol.  Process Delays Noted: None  Bedside handoff with ED RN Meghan N..    Burnard KANDICE Bras  Stroke Response RN

## 2023-11-20 ENCOUNTER — Observation Stay
Admit: 2023-11-20 | Discharge: 2023-11-20 | Disposition: A | Discharge status: Home / Self Care | Attending: Obstetrics and Gynecology | Admitting: Obstetrics and Gynecology

## 2023-11-20 ENCOUNTER — Encounter: Payer: Self-pay | Admitting: Obstetrics and Gynecology

## 2023-11-20 ENCOUNTER — Observation Stay

## 2023-11-20 DIAGNOSIS — R600 Localized edema: Secondary | ICD-10-CM | POA: Diagnosis not present

## 2023-11-20 DIAGNOSIS — G459 Transient cerebral ischemic attack, unspecified: Secondary | ICD-10-CM | POA: Diagnosis not present

## 2023-11-20 DIAGNOSIS — Q248 Other specified congenital malformations of heart: Secondary | ICD-10-CM

## 2023-11-20 LAB — ECHOCARDIOGRAM COMPLETE BUBBLE STUDY
AR max vel: 2.91 cm2
AV Area VTI: 2.73 cm2
AV Area mean vel: 2.9 cm2
AV Mean grad: 4 mmHg
AV Peak grad: 7.6 mmHg
Ao pk vel: 1.38 m/s
Area-P 1/2: 3.12 cm2
Calc EF: 66.3 %
MV VTI: 3.82 cm2
S' Lateral: 3.8 cm
Single Plane A2C EF: 68.8 %
Single Plane A4C EF: 65.2 %

## 2023-11-20 LAB — LIPID PANEL
Cholesterol: 105 mg/dL (ref 0–200)
HDL: 31 mg/dL — ABNORMAL LOW (ref >40)
LDL Cholesterol: 65 mg/dL (ref 0–99)
Total CHOL/HDL Ratio: 3.4 ratio
Triglycerides: 47 mg/dL (ref <150)
VLDL: 9 mg/dL (ref 0–40)

## 2023-11-20 MED ORDER — CLOPIDOGREL BISULFATE 75 MG PO TABS: 75.0000 mg | ORAL_TABLET | Freq: Every day | ORAL | 0 refills | Status: AC

## 2023-11-20 MED ORDER — ASPIRIN 81 MG PO TBEC: 81.0000 mg | DELAYED_RELEASE_TABLET | Freq: Every day | ORAL | 3 refills | Status: AC

## 2023-11-20 NOTE — Discharge Summary (Signed)
 Seth Doyle FMW:[id number] DOB: [birth year] DOA: 11/19/2023  PCP: Marylynn Verneita CROME, MD  Admit date: 11/19/2023 Discharge date: 11/20/2023  Time spent: 35 minutes  Recommendations for Outpatient Follow-up:  Pcp and neurology f/u (referral placed for [facility] neurology) Cardiology f/u     Discharge Diagnoses:  Principal Problem:   TIA (transient ischemic attack) Active Problems:   Intestinal bypass or anastomosis status   Essential hypertension   Melanoma of skin (HCC)   Interatrial cardiac shunt   Discharge Condition: improved  Diet recommendation: heart healthy  Filed Weights   11/19/23 1135  Weight: 83 kg    History of present illness:  From admission h and p Seth Doyle is a 68 y.o. male with medical history significant for htn who presents with the above complaint.   Two weeks ago suffered transient (less than a minute) sensation of difficulty using left leg, left arm paresthesia, and cold sensation left side of face. Then today at work felt very tired. Used the restroom and had difficulty standing he felt so tired. Sat down and closed his eyes and experienced room spinning sensation. No focal weakness or numbness. Promptly felt nauseaus and vomited (nbnb). No abd pain or diarrhea. No nausea or vomiting since. Continues to feel vertigo when closes his eyes, has had vertigo in the past but this is a different quality.  Hospital Course:   # Vertigo # TIA # Interatrial shunt CT head, CT angio head/neck, MRI brain all negative. Seen by neurolgy who advises TIA w/u. Is on a number of meds that could cause or contribute to vertigo (norco, gabapentin , flexeril , zolpidem ). By hospital day one symptoms resolved. 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 Neurology advised dvt eval which was negative. Reviewed with dr wilburn of cardiology who advises outpatient f/u, given otherwise normal TTE likely not hemodynamically significant and can be monitored. Normal  pt/ot eval, passed swallow screen. Ldl is less than 70. No events on tele. Neurology cleared for discharge, advised dapt for 3 wks followed by aspirin monotherapy. Referral placed to [facility].   # HTN Bp appropriate - resume amlodipine  tomorrow   # Low back pain Followed by neurosurg outpt, has procedure scheduled next week - home gabapentin , norco  Procedures: none   Consultations: neurology  Discharge Exam: Vitals:   11/20/23 0700 11/20/23 1203  BP:  128/86  Pulse:  72  Resp:    Temp: 98 F (36.7 C) 98.1 F (36.7 C)  SpO2:  98%    General: NAD Cardiovascular: RRR Respiratory: CTAB   Discharge Instructions   Discharge Instructions     Ambulatory referral to Neurology   Complete by: As directed    An appointment is requested in approximately: 4 weeks   Diet - low sodium heart healthy   Complete by: As directed    Increase activity slowly   Complete by: As directed       Allergies as of 11/20/2023       Reactions   Wellbutrin  [bupropion ] Itching        Medication List     STOP taking these medications    cyclobenzaprine  10 MG tablet Commonly known as: FLEXERIL    meloxicam  15 MG tablet Commonly known as: MOBIC    vitamin C 100 MG tablet       TAKE these medications    amLODipine  2.5 MG tablet Commonly known as: NORVASC  Take 1 tablet (2.5 mg total) by mouth daily. What changed: when to take this  aspirin EC 81 MG tablet Take 1 tablet (81 mg total) by mouth daily. Swallow whole. Start taking on: November 21, 2023   clobetasol  cream 0.05 % Commonly known as: TEMOVATE  Apply twice daily to affected areas on body as needed for rash/itching. Avoid applying to face, groin, and axilla.   clobetasol  0.05 % external solution Commonly known as: TEMOVATE  Patient to mix clobetasol  solution in jar of CeraVe Cream. Apply to itchy rash once to twice daily until improved. Avoid applying to face, groin, and axilla. Use as directed. Long-term use  can cause thinning of the skin.   clopidogrel 75 MG tablet Commonly known as: PLAVIX Take 1 tablet (75 mg total) by mouth daily for 21 days. Start taking on: November 21, 2023   gabapentin  300 MG capsule Commonly known as: NEURONTIN  Take 300 mg by mouth 3 (three) times daily. What changed: Another medication with the same name was removed. Continue taking this medication, and follow the directions you see here.   HYDROcodone -acetaminophen  5-325 MG tablet Commonly known as: NORCO/VICODIN Take 1 tablet by mouth every 4 (four) hours as needed.   hydrOXYzine  25 MG tablet Commonly known as: ATARAX  Take 1 tablet at bedtime as needed for itching.   tadalafil  10 MG tablet Commonly known as: Cialis  Take 1 tablet (10 mg total) by mouth daily. Take 10 mg daily with a 10 mg boost dose as needed 45 minutes prior to sexual activity   zolpidem  10 MG tablet Commonly known as: AMBIEN  TAKE A HALF TABLET BY MOUTH EVERY NIGHT AT BEDTIME AS NEEDED FOR SLEEP       Allergies  Allergen Reactions   Wellbutrin  [Bupropion ] Itching    Follow-up Information     Marylynn Verneita CROME, MD Follow up.   Specialty: Internal Medicine Contact information: [address] [place] [address] [phone]         Wilburn, Keller BROCKS, MD Follow up.   Specialty: Cardiology Contact information: [address] [place] [address] [phone]                  The results of significant diagnostics from this hospitalization (including imaging, microbiology, ancillary and laboratory) are listed below for reference.    Significant Diagnostic Studies: US  Venous Img Lower Bilateral (DVT) Result Date: 11/20/2023 CLINICAL DATA:  Bilateral lower extremity edema EXAM: BILATERAL LOWER EXTREMITY VENOUS DOPPLER ULTRASOUND TECHNIQUE: Gray-scale sonography with graded compression, as well as color Doppler and duplex ultrasound were performed to evaluate the lower extremity deep venous systems  from the level of the common femoral vein and including the common femoral, femoral, profunda femoral, popliteal and calf veins including the posterior tibial, peroneal and gastrocnemius veins when visible. The superficial great saphenous vein was also interrogated. Spectral Doppler was utilized to evaluate flow at rest and with distal augmentation maneuvers in the common femoral, femoral and popliteal veins. COMPARISON:  None Available. FINDINGS: RIGHT LOWER EXTREMITY Common Femoral Vein: No evidence of thrombus. Normal compressibility, respiratory phasicity and response to augmentation. Saphenofemoral Junction: No evidence of thrombus. Normal compressibility and flow on color Doppler imaging. Profunda Femoral Vein: No evidence of thrombus. Normal compressibility and flow on color Doppler imaging. Femoral Vein: No evidence of thrombus. Normal compressibility, respiratory phasicity and response to augmentation. Popliteal Vein: No evidence of thrombus. Normal compressibility, respiratory phasicity and response to augmentation. Calf Veins: No evidence of thrombus. Normal compressibility and flow on color Doppler imaging. Superficial Great Saphenous Vein: No evidence of thrombus. Normal compressibility. Venous Reflux:  None. Other Findings:  None. LEFT LOWER EXTREMITY Common Femoral Vein: No evidence of thrombus. Normal compressibility, respiratory phasicity and response to augmentation. Saphenofemoral Junction: No evidence of thrombus. Normal compressibility and flow on color Doppler imaging. Profunda Femoral Vein: No evidence of thrombus. Normal compressibility and flow on color Doppler imaging. Femoral Vein: No evidence of thrombus. Normal compressibility, respiratory phasicity and response to augmentation. Popliteal Vein: No evidence of thrombus. Normal compressibility, respiratory phasicity and response to augmentation. Calf Veins: No evidence of thrombus. Normal compressibility and flow on color Doppler imaging.  Superficial Great Saphenous Vein: No evidence of thrombus. Normal compressibility. Venous Reflux:  None. Other Findings:  None. IMPRESSION: No evidence of deep venous thrombosis in either lower extremity. Electronically Signed   By: Wilkie Lent M.D.   On: 11/20/2023 15:47   ECHOCARDIOGRAM COMPLETE BUBBLE STUDY Result Date: 11/20/2023    ECHOCARDIOGRAM REPORT   Patient Name:   MAMOUDOU MULVEHILL Date of Exam: 11/20/2023 Medical Rec #:  [id number]        Height:       71.0 in Accession #:    [id number]       Weight:       183.0 lb Date of Birth:  Nov 20, 1955        BSA:          2.030 m Patient Age:    68 years         BP:           118/83 mmHg Patient Gender: M                HR:           80 bpm. Exam Location:  [facility] Procedure: 2D Echo, Cardiac Doppler, Color Doppler and Saline Contrast Bubble            Study (Both Spectral and Color Flow Doppler were utilized during            procedure). Indications:     TIA (transient ischemic attack) 435.9 / G45.9  History:         Patient has no prior history of Echocardiogram examinations.                  TIA.  Sonographer:     Ashley McNeely-Sloane Referring Phys:  [id number] [facility] Sherina Stammer Diagnosing Phys: Keller Paterson IMPRESSIONS  1. Left ventricular ejection fraction, by estimation, is 60 to 65%. The left ventricle has normal function. The left ventricle has no regional wall motion abnormalities. Left ventricular diastolic parameters were normal.  2. Right ventricular systolic function is normal. The right ventricular size is normal.  3. The mitral valve is normal in structure. Trivial mitral valve regurgitation.  4. The aortic valve is tricuspid. Aortic valve regurgitation is not visualized.  5. The inferior vena cava is normal in size with greater than 50% respiratory variability, suggesting right atrial pressure of 3 mmHg.  6. Agitated saline contrast bubble study was positive with shunting observed within 3-6 cardiac cycles suggestive of interatrial shunt.  FINDINGS  Left Ventricle: Left ventricular ejection fraction, by estimation, is 60 to 65%. The left ventricle has normal function. The left ventricle has no regional wall motion abnormalities. The left ventricular internal cavity size was normal in size. There is  no left ventricular hypertrophy. Left ventricular diastolic parameters were normal. Right Ventricle: The right ventricular size is normal. No increase in right ventricular wall thickness. Right ventricular systolic function is normal. Left Atrium: Left atrial  size was normal in size. Right Atrium: Right atrial size was normal in size. Pericardium: There is no evidence of pericardial effusion. Mitral Valve: The mitral valve is normal in structure. Trivial mitral valve regurgitation. MV peak gradient, 3.3 mmHg. The mean mitral valve gradient is 1.0 mmHg. Tricuspid Valve: The tricuspid valve is normal in structure. Tricuspid valve regurgitation is trivial. Aortic Valve: The aortic valve is tricuspid. Aortic valve regurgitation is not visualized. Aortic valve mean gradient measures 4.0 mmHg. Aortic valve peak gradient measures 7.6 mmHg. Aortic valve area, by VTI measures 2.73 cm. Pulmonic Valve: The pulmonic valve was not well visualized. Pulmonic valve regurgitation is not visualized. Aorta: The aortic root and ascending aorta are structurally normal, with no evidence of dilitation. Venous: The inferior vena cava is normal in size with greater than 50% respiratory variability, suggesting right atrial pressure of 3 mmHg. IAS/Shunts: Agitated saline contrast was given intravenously to evaluate for intracardiac shunting. Agitated saline contrast bubble study was positive with shunting observed within 3-6 cardiac cycles suggestive of interatrial shunt.  LEFT VENTRICLE PLAX 2D LVIDd:         5.00 cm     Diastology LVIDs:         3.80 cm     LV e' medial:    8.49 cm/s LV PW:         0.80 cm     LV E/e' medial:  6.8 LV IVS:        0.90 cm     LV e' lateral:   11.90  cm/s LVOT diam:     2.00 cm     LV E/e' lateral: 4.8 LV SV:         70 LV SV Index:   35 LVOT Area:     3.14 cm  LV Volumes (MOD) LV vol d, MOD A2C: 59.9 ml LV vol d, MOD A4C: 73.8 ml LV vol s, MOD A2C: 18.7 ml LV vol s, MOD A4C: 25.7 ml LV SV MOD A2C:     41.2 ml LV SV MOD A4C:     73.8 ml LV SV MOD BP:      44.9 ml RIGHT VENTRICLE RV Basal diam:  4.10 cm RV Mid diam:    3.10 cm RV S prime:     14.60 cm/s TAPSE (M-mode): 1.6 cm LEFT ATRIUM             Index        RIGHT ATRIUM           Index LA diam:        3.50 cm 1.72 cm/m   RA Area:     15.30 cm LA Vol (A2C):   36.2 ml 17.83 ml/m  RA Volume:   41.20 ml  20.29 ml/m LA Vol (A4C):   19.2 ml 9.46 ml/m LA Biplane Vol: 28.4 ml 13.99 ml/m  AORTIC VALVE                    PULMONIC VALVE AV Area (Vmax):    2.91 cm     PV Vmax:        1.26 m/s AV Area (Vmean):   2.90 cm     PV Vmean:       87.300 cm/s AV Area (VTI):     2.73 cm     PV VTI:         0.244 m AV Vmax:           138.00 cm/s  PV Peak grad:   6.4 mmHg AV Vmean:          90.800 cm/s  PV Mean grad:   4.0 mmHg AV VTI:            0.258 m      RVOT Peak grad: 3 mmHg AV Peak Grad:      7.6 mmHg AV Mean Grad:      4.0 mmHg LVOT Vmax:         128.00 cm/s LVOT Vmean:        83.800 cm/s LVOT VTI:          0.224 m LVOT/AV VTI ratio: 0.87  AORTA Ao Root diam: 2.90 cm Ao Asc diam:  2.90 cm MITRAL VALVE MV Area (PHT): 3.12 cm    SHUNTS MV Area VTI:   3.82 cm    Systemic VTI:  0.22 m MV Peak grad:  3.3 mmHg    Systemic Diam: 2.00 cm MV Mean grad:  1.0 mmHg    Pulmonic VTI:  0.185 m MV Vmax:       0.90 m/s MV Vmean:      57.2 cm/s MV Decel Time: 243 msec MV E velocity: 57.60 cm/s MV A velocity: 90.10 cm/s MV E/A ratio:  0.64 Keller Paterson Electronically signed by Keller Paterson Signature Date/Time: 11/20/2023/11:36:49 AM    Final    MR BRAIN WO CONTRAST Result Date: 11/19/2023 CLINICAL DATA:  Neuro deficit, acute, stroke suspected EXAM: MRI HEAD WITHOUT CONTRAST TECHNIQUE: Multiplanar, multiecho pulse sequences of  the brain and surrounding structures were obtained without intravenous contrast. COMPARISON:  CT of the head dated November 19, 2023. FINDINGS: Brain: Age-related cerebral atrophy. Mild periventricular and subcortical white matter disease. No evidence of hemorrhage, mass, cortical infarct or hydrocephalus. No restricted diffusion to indicate acute or recent infarction. Vascular: Normal vascular flow voids. Skull and upper cervical spine: Normal bone marrow signal. No lesions evident. Sinuses/Orbits: No acute process. Other: None. IMPRESSION: 1. Mild nonspecific cerebral white matter disease. No apparent acute process. Electronically Signed   By: Evalene Coho M.D.   On: 11/19/2023 14:55   CT Angio Head Neck W WO CM (CODE STROKE) Result Date: 11/19/2023 CLINICAL DATA:  Provided history: Neuro deficit, acute, stroke suspected. Additional history obtained from electronic MEDICAL RECORD NUMBERSudden onset nausea and vomiting, generalized weakness, dizziness. EXAM: CT ANGIOGRAPHY HEAD AND NECK WITH AND WITHOUT CONTRAST TECHNIQUE: Multidetector CT imaging of the head and neck was performed using the standard protocol during bolus administration of intravenous contrast. Multiplanar CT image reconstructions and MIPs were obtained to evaluate the vascular anatomy. Carotid stenosis measurements (when applicable) are obtained utilizing NASCET criteria, using the distal internal carotid diameter as the denominator. RADIATION DOSE REDUCTION: This exam was performed according to the departmental dose-optimization program which includes automated exposure control, adjustment of the mA and/or kV according to patient size and/or use of iterative reconstruction technique. CONTRAST:  75mL OMNIPAQUE  IOHEXOL  350 MG/ML SOLN COMPARISON:  Non-contrast head CT performed earlier today 11/19/2023. FINDINGS: CTA NECK FINDINGS Aortic arch: The left vertebral artery arises directly from the aortic arch. Mild atherosclerotic plaque within the visible  thoracic aorta and proximal major branch vessels of the neck. Streak/beam hardening artifact arising from a dense contrast bolus partially obscures the right subclavian artery. Within this limitation, there is no appreciable hemodynamically significant innominate or proximal subclavian artery stenosis. Right carotid system: CCA and ICA patent within the neck without stenosis. Mild atherosclerotic plaque about the carotid bifurcation. Mild tortuosity of  the distal cervical ICA. Left carotid system: CCA and ICA patent within the neck without stenosis or significant atherosclerotic disease. Vertebral arteries: Patent within the neck without stenosis or significant atherosclerotic disease. The right vertebral artery is dominant. Skeleton: Nonspecific reversal of the expected cervical lordosis. Mild grade 1 anterolisthesis at C2-C3, C7-T1, T2-T3 and T3-T4. No acute fracture or aggressive osseous lesion. Other neck: No neck mass or cervical lymphadenopathy. Upper chest: No consolidation within the imaged lung apices. Review of the MIP images confirms the above findings CTA HEAD FINDINGS Anterior circulation: The intracranial internal carotid arteries are patent. The M1 middle cerebral arteries are patent. No M2 proximal branch occlusion or high-grade proximal stenosis. The anterior cerebral arteries are patent. No intracranial aneurysm is identified. Posterior circulation: The intracranial vertebral arteries are patent. The basilar artery is patent. The posterior cerebral arteries are patent. The right PCA is fetal in origin. The left posterior communicating artery is diminutive or absent. Venous sinuses: Within the limitations of contrast timing, no convincing thrombus. Anatomic variants: As described. Review of the MIP images confirms the above findings No proximal large vessel occlusion identified. These results were communicated to Dr. Matthews at 12:51 pmon 7/1/2025by text page via the [facility] messaging system.  IMPRESSION: CTA neck: 1. The common carotid, internal carotid and vertebral arteries are patent within the neck without stenosis. Mild atherosclerotic plaque about the right carotid bifurcation. 2. Aortic Atherosclerosis (ICD10-I70.0). CTA head: No proximal intracranial large vessel occlusion or high-grade proximal arterial stenosis identified. Electronically Signed   By: Rockey Childs D.O.   On: 11/19/2023 12:51   CT HEAD CODE STROKE WO CONTRAST Result Date: 11/19/2023 CLINICAL DATA:  Code stroke.  Acute neurological deficit. EXAM: CT HEAD WITHOUT CONTRAST TECHNIQUE: Contiguous axial images were obtained from the base of the skull through the vertex without intravenous contrast. RADIATION DOSE REDUCTION: This exam was performed according to the departmental dose-optimization program which includes automated exposure control, adjustment of the mA and/or kV according to patient size and/or use of iterative reconstruction technique. COMPARISON:  None Available. FINDINGS: Brain: Age-related cerebral volume loss. No evidence of hemorrhage, mass, cortical infarct or hydrocephalus. Vascular: Negative. Skull: Intact and unremarkable. Sinuses/Orbits: Negative. Other: None. IMPRESSION: 1. Age-related cerebral volume loss.  Negative. 2. ASPECTS is 10. 3. These results were paged at the time of interpretation on 11/19/2023 at 12:16 pm to provider Dr. Matthews. Electronically Signed   By: Evalene Coho M.D.   On: 11/19/2023 12:18    Microbiology: No results found for this or any previous visit (from the past 240 hours).   Labs: Basic Metabolic Panel: Recent Labs  Lab 11/19/23 1155  NA 136  K 3.7  CL 101  CO2 26  GLUCOSE 127*  BUN 20  CREATININE 1.06  CALCIUM 8.6*  MG 2.2   Liver Function Tests: Recent Labs  Lab 11/19/23 1155  AST 26  ALT 20  ALKPHOS 58  BILITOT 0.6  PROT 6.3*  ALBUMIN 3.6   No results for input(s): LIPASE, AMYLASE in the last 168 hours. No results for input(s): AMMONIA in  the last 168 hours. CBC: Recent Labs  Lab 11/19/23 1155  WBC 4.9  NEUTROABS 3.3  HGB 11.3*  HCT 36.1*  MCV 81.5  PLT 199   Cardiac Enzymes: No results for input(s): CKTOTAL, CKMB, CKMBINDEX, TROPONINI in the last 168 hours. BNP: BNP (last 3 results) No results for input(s): BNP in the last 8760 hours.  ProBNP (last 3 results) No results for input(s): PROBNP in  the last 8760 hours.  CBG: Recent Labs  Lab 11/19/23 1212  GLUCAP 116*       Signed:  Devaughn KATHEE Ban MD.  [facility] 11/20/2023, 4:04 PM

## 2023-11-20 NOTE — Progress Notes (Signed)
 OT Cancellation Note  Patient Details Name: Seth Doyle MRN: [id number] DOB: 03-17-1956   Cancelled Treatment:    Reason Eval/Treat Not Completed: OT screened, no needs identified, will sign off. Per staff report, pt is at baseline and independent with ambulation and self care needs. Pt does not need acute OT evaluation at this time. OT to complete order.  Izetta Claude, MS, OTR/L , CBIS ascom [phone]  11/20/23, 11:18 AM

## 2023-11-20 NOTE — Evaluation (Signed)
 Physical Therapy Evaluation Patient Details Name: Seth Doyle MRN: [id number] DOB: Oct 06, 1955 Today's Date: 11/20/2023  History of Present Illness  68 y/o male s/p transient (less than a minute) sensation of difficulty using left leg, left arm paresthesia, and cold sensation left side of face. Then later at work felt very tired and had a bout of vomiting.  At time of PT eval he was feeling baseline, reports no acute issues (Feeling completely normal) and that his dizziness was not vertigo I have had real vertigo in the past.  Clinical Impression  Pt did very well with PT eval and was able to do all mobility tasks, ambulation and all balance challenges without issue.  He (and wife) reports he appears at his baseline; he did not have any weakness, numbness, discoordination or other issues.  Pt does not require further PT, will complete PT orders at this time.        If plan is discharge home, recommend the following:     Can travel by private vehicle        Equipment Recommendations None recommended by PT  Recommendations for Other Services       Functional Status Assessment Patient has not had a recent decline in their functional status     Precautions / Restrictions Precautions Precautions: None Restrictions Weight Bearing Restrictions Per Provider Order: No      Mobility  Bed Mobility Overal bed mobility: Independent                  Transfers Overall transfer level: Independent Equipment used: None               General transfer comment: easily rises to standing w/o issue    Ambulation/Gait Ambulation/Gait assistance: Independent Gait Distance (Feet): 200 Feet Assistive device: None         General Gait Details: Pt was able to ambulate around nurses' station with out issue.  No LOBs, fatigue or other issues - able to do head turns, nods, speed changes, stop/turns, high stepping, etc all w/o LOB, S/S or safety concerns.  Stairs             Wheelchair Mobility     Tilt Bed    Modified Rankin (Stroke Patients Only)       Balance Overall balance assessment: Independent                                           Pertinent Vitals/Pain Pain Assessment Pain Assessment: No/denies pain    Home Living Family/patient expects to be discharged to:: Private residence Living Arrangements: Spouse/significant other Available Help at Discharge: Available 24 hours/day   Home Access: Level entry       Home Layout: One level        Prior Function Prior Level of Function : Independent/Modified Independent             Mobility Comments: Pt works, stays active, independent w/o AD ADLs Comments: independent     Extremity/Trunk Assessment   Upper Extremity Assessment Upper Extremity Assessment: Overall WFL for tasks assessed (= b/l)    Lower Extremity Assessment Lower Extremity Assessment: Overall WFL for tasks assessed (= b/l)       Communication   Communication Communication: No apparent difficulties Factors Affecting Communication: Hearing impaired    Cognition Arousal: Alert Behavior During Therapy: WFL for tasks assessed/performed  PT - Cognitive impairments: No apparent impairments                         Following commands: Intact       Cueing Cueing Techniques: Verbal cues     General Comments General comments (skin integrity, edema, etc.): Pt reports feeling at baseline, showed no functional limitations at all    Exercises     Assessment/Plan    PT Assessment Patient needs continued PT services  PT Problem List         PT Treatment Interventions      PT Goals (Current goals can be found in the Care Plan section)       Frequency  (PT eval only)     Co-evaluation               AM-PAC PT 6 Clicks Mobility  Outcome Measure Help needed turning from your back to your side while in a flat bed without using bedrails?: None Help needed  moving from lying on your back to sitting on the side of a flat bed without using bedrails?: None Help needed moving to and from a bed to a chair (including a wheelchair)?: None Help needed standing up from a chair using your arms (e.g., wheelchair or bedside chair)?: None Help needed to walk in hospital room?: None Help needed climbing 3-5 steps with a railing? : None 6 Click Score: 24    End of Session   Activity Tolerance: Patient tolerated treatment well Patient left: in chair;with family/visitor present;with call bell/phone within reach Nurse Communication: Mobility status PT Visit Diagnosis: Unsteadiness on feet (R26.81);Muscle weakness (generalized) (M62.81);Other symptoms and signs involving the nervous system (R29.898)    Time: 9149-9097 PT Time Calculation (min) (ACUTE ONLY): 12 min   Charges:   PT Evaluation $PT Eval Low Complexity: 1 Low   PT General Charges $$ ACUTE PT VISIT: 1 Visit         Carmin JONELLE Deed, DPT 11/20/2023, 11:30 AM

## 2023-11-20 NOTE — Progress Notes (Signed)
 Transition of Care [facility]) - Inpatient Brief Assessment   Patient Details  Name: Seth Doyle MRN: [id number] Date of Birth: 10-11-1955  Transition of Care [facility]) CM/SW Contact:    Saraiyah Hemminger C Arminta Gamm, RN Phone Number: 11/20/2023, 1:46 PM   Clinical Narrative:   TOC continuing to follow patient's progress throughout discharge planning.             Transition of Care Asessment: Insurance and Status: Insurance coverage has been reviewed Patient has primary care physician: Yes   Prior level of function:: independent Prior/Current Home Services: No current home services Social Drivers of Health Review: SDOH reviewed no interventions necessary Readmission risk has been reviewed: Yes Transition of care needs: no transition of care needs at this time

## 2023-11-20 NOTE — Evaluation (Signed)
 Physical Therapy Evaluation Patient Details Name: Seth Doyle MRN: [id number] DOB: 02-12-1956 Today's Date: 11/20/2023  History of Present Illness  67 y/o male s/p transient (less than a minute) sensation of difficulty using left leg, left arm paresthesia, and cold sensation left side of face. Then later at work felt very tired and had a bout of vomiting.  At time of PT eval he was feeling baseline, reports no acute issues (Feeling completely normal) and that his dizziness was not vertigo I have had real vertigo in the past.  Clinical Impression  Pt laying in bed in NAD, reports all symptoms have long since resolved and he rightly reports I don't know if I have any need for PT.  Pt with no numbness, weakness, nausea, vision issues, swallowing issues, etc and was able to perform all tasks w/ confidence and safety.  Pt showed ability to return home w/o issue, wife is available to assist should he need help.  Pt with no PT needs, will sign-off at this time.        If plan is discharge home, recommend the following:     Can travel by private vehicle        Equipment Recommendations None recommended by PT  Recommendations for Other Services       Functional Status Assessment Patient has not had a recent decline in their functional status     Precautions / Restrictions Precautions Precautions: None Restrictions Weight Bearing Restrictions Per Provider Order: No      Mobility  Bed Mobility Overal bed mobility: Independent                  Transfers Overall transfer level: Independent Equipment used: None               General transfer comment: easily rises to standing w/o issue    Ambulation/Gait Ambulation/Gait assistance: Independent Gait Distance (Feet): 200 Feet Assistive device: None         General Gait Details: Pt was able to ambulate around nurses' station with out issue.  No LOBs, fatigue or other issues - able to do head turns, nods, speed  changes, stop/turns, high stepping, etc all w/o LOB, S/S or safety concerns.  Stairs            Wheelchair Mobility     Tilt Bed    Modified Rankin (Stroke Patients Only)       Balance Overall balance assessment: Independent                                           Pertinent Vitals/Pain Pain Assessment Pain Assessment: No/denies pain    Home Living Family/patient expects to be discharged to:: Private residence Living Arrangements: Spouse/significant other Available Help at Discharge: Available 24 hours/day   Home Access: Level entry       Home Layout: One level        Prior Function Prior Level of Function : Independent/Modified Independent             Mobility Comments: Pt works, stays active, independent w/o AD ADLs Comments: independent     Extremity/Trunk Assessment   Upper Extremity Assessment Upper Extremity Assessment: Overall WFL for tasks assessed (= b/l)    Lower Extremity Assessment Lower Extremity Assessment: Overall WFL for tasks assessed (= b/l)       Communication   Communication Communication: No  apparent difficulties Factors Affecting Communication: Hearing impaired    Cognition Arousal: Alert Behavior During Therapy: WFL for tasks assessed/performed   PT - Cognitive impairments: No apparent impairments                         Following commands: Intact       Cueing Cueing Techniques: Verbal cues     General Comments General comments (skin integrity, edema, etc.): Pt reports feeling at baseline, showed no functional limitations at all    Exercises     Assessment/Plan    PT Assessment Patient needs continued PT services  PT Problem List         PT Treatment Interventions      PT Goals (Current goals can be found in the Care Plan section)       Frequency  (PT eval only)     Co-evaluation               AM-PAC PT 6 Clicks Mobility  Outcome Measure Help needed  turning from your back to your side while in a flat bed without using bedrails?: None Help needed moving from lying on your back to sitting on the side of a flat bed without using bedrails?: None Help needed moving to and from a bed to a chair (including a wheelchair)?: None Help needed standing up from a chair using your arms (e.g., wheelchair or bedside chair)?: None Help needed to walk in hospital room?: None Help needed climbing 3-5 steps with a railing? : None 6 Click Score: 24    End of Session   Activity Tolerance: Patient tolerated treatment well Patient left: in chair;with family/visitor present;with call bell/phone within reach Nurse Communication: Mobility status PT Visit Diagnosis: Unsteadiness on feet (R26.81);Muscle weakness (generalized) (M62.81);Other symptoms and signs involving the nervous system (R29.898)    Time: 9149-9097 PT Time Calculation (min) (ACUTE ONLY): 12 min   Charges:   PT Evaluation $PT Eval Low Complexity: 1 Low   PT General Charges $$ ACUTE PT VISIT: 1 Visit         Carmin JONELLE Deed, DPT 11/20/2023, 9:20 AM

## 2023-11-20 NOTE — ED Notes (Signed)
 Patient resting with eyes closed. Chest rise and fall noted. VSS.

## 2023-11-21 ENCOUNTER — Encounter: Payer: Self-pay | Admitting: Internal Medicine

## 2023-11-22 LAB — HEMOGLOBIN A1C
Hgb A1c MFr Bld: 5.7 % — ABNORMAL HIGH (ref 4.8–5.6)
Mean Plasma Glucose: 117 mg/dL

## 2023-11-28 DIAGNOSIS — M47816 Spondylosis without myelopathy or radiculopathy, lumbar region: Secondary | ICD-10-CM | POA: Diagnosis not present

## 2023-12-03 ENCOUNTER — Telehealth: Payer: Self-pay

## 2023-12-03 ENCOUNTER — Ambulatory Visit: Admitting: Internal Medicine

## 2023-12-03 ENCOUNTER — Encounter: Payer: Self-pay | Admitting: Internal Medicine

## 2023-12-03 VITALS — BP 124/72 | HR 81 | Ht 71.0 in | Wt 190.4 lb

## 2023-12-03 DIAGNOSIS — R5383 Other fatigue: Secondary | ICD-10-CM

## 2023-12-03 DIAGNOSIS — Z09 Encounter for follow-up examination after completed treatment for conditions other than malignant neoplasm: Secondary | ICD-10-CM

## 2023-12-03 DIAGNOSIS — E782 Mixed hyperlipidemia: Secondary | ICD-10-CM | POA: Diagnosis not present

## 2023-12-03 DIAGNOSIS — D500 Iron deficiency anemia secondary to blood loss (chronic): Secondary | ICD-10-CM

## 2023-12-03 DIAGNOSIS — Z8673 Personal history of transient ischemic attack (TIA), and cerebral infarction without residual deficits: Secondary | ICD-10-CM

## 2023-12-03 DIAGNOSIS — M5416 Radiculopathy, lumbar region: Secondary | ICD-10-CM

## 2023-12-03 DIAGNOSIS — M48061 Spinal stenosis, lumbar region without neurogenic claudication: Secondary | ICD-10-CM

## 2023-12-03 DIAGNOSIS — Q248 Other specified congenital malformations of heart: Secondary | ICD-10-CM

## 2023-12-03 MED ORDER — ROSUVASTATIN CALCIUM 5 MG PO TABS: 5.0000 mg | ORAL_TABLET | Freq: Every day | ORAL | 3 refills | Status: AC

## 2023-12-03 NOTE — Telephone Encounter (Signed)
 Patient was discharged from hospital on 11/20/2023 and scheduled a hospital follow-up appointment with Dr. Marylynn via MyChart for 12/13/2023.

## 2023-12-03 NOTE — Telephone Encounter (Signed)
 Is that in the time frame?

## 2023-12-03 NOTE — Progress Notes (Unsigned)
 Subjective:  Patient ID: Seth Doyle, male    DOB: [birth year]  Age: 68 y.o. MRN: [id number]  CC: There were no encounter diagnoses.   HPI Seth Doyle presents for  Chief Complaint  Patient presents with   Hospitalization Follow-up    Seth Doyle is a 68 yr old  male with a history of hypertension who was admitted to [facility] on July 1 with diagnosis of TIA after having transient neurologic symptoms 2 weeks prior to presentation. . The night before had taken  a familiar combination of 1/2 tsp tussionex,  gabapentin  and flexeril   . Felt fine the next morning , went to work at 6 am , but two hours after arriving at work suddently felt sedated , legs /arms too weak too stand   CODE stroke was called.   MRI brain was negative for acute or prior cVA but did showed age related atrophy and chronic microvascular ischemic changes.  CTA of neck and brain noted Mild atherosclerotic plaque about the right carotid bifurcation and  Aortic Atherosclerosis (ICD10-I70.0) LDL was < 70 so no statin was advised.  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 Neurology advised dvt eval which was negative. Reviewed with dr wilburn of cardiology who advises outpatient f/u, given otherwise normal TTE likely not hemodynamically significant and can be monitored. Normal pt/ot eval, passed swallow screen. He had No events on tele.  Neurology cleared for discharge, advised dual antiplatelet therapy  for 3 wks followed by aspirin  monotherapy. Referral placed to [facility].    He has been taking  plavix  and asa   Outpatient Medications Prior to Visit  Medication Sig Dispense Refill   amLODipine  (NORVASC ) 2.5 MG tablet Take 1 tablet (2.5 mg total) by mouth daily. (Patient taking differently: Take 2.5 mg by mouth every evening.) 90 tablet 1   aspirin  EC 81 MG tablet Take 1 tablet (81 mg total) by mouth daily. Swallow whole. 90 tablet 3   clobetasol  (TEMOVATE ) 0.05 % external solution Patient to mix  clobetasol  solution in jar of CeraVe Cream. Apply to itchy rash once to twice daily until improved. Avoid applying to face, groin, and axilla. Use as directed. Long-term use can cause thinning of the skin. 50 mL 3   clobetasol  cream (TEMOVATE ) 0.05 % Apply twice daily to affected areas on body as needed for rash/itching. Avoid applying to face, groin, and axilla. 30 g 2   clopidogrel  (PLAVIX ) 75 MG tablet Take 1 tablet (75 mg total) by mouth daily for 21 days. 21 tablet 0   gabapentin  (NEURONTIN ) 300 MG capsule Take 300 mg by mouth 3 (three) times daily.     HYDROcodone -acetaminophen  (NORCO/VICODIN) 5-325 MG tablet Take 1 tablet by mouth every 4 (four) hours as needed.     hydrOXYzine  (ATARAX ) 25 MG tablet Take 1 tablet at bedtime as needed for itching. 30 tablet 3   tadalafil  (CIALIS ) 10 MG tablet Take 1 tablet (10 mg total) by mouth daily. Take 10 mg daily with a 10 mg boost dose as needed 45 minutes prior to sexual activity 30 tablet 11   zolpidem  (AMBIEN ) 10 MG tablet TAKE A HALF TABLET BY MOUTH EVERY NIGHT AT BEDTIME AS NEEDED FOR SLEEP 30 tablet 5   No facility-administered medications prior to visit.    Review of Systems;  Patient denies headache, fevers, malaise, unintentional weight loss, skin rash, eye pain, sinus congestion and sinus pain, sore throat, dysphagia,  hemoptysis , cough, dyspnea, wheezing, chest pain,  palpitations, orthopnea, edema, abdominal pain, nausea, melena, diarrhea, constipation, flank pain, dysuria, hematuria, urinary  Frequency, nocturia, numbness, tingling, seizures,  Focal weakness, Loss of consciousness,  Tremor, insomnia, depression, anxiety, and suicidal ideation.      Objective:  There were no vitals taken for this visit.  BP Readings from Last 3 Encounters:  11/20/23 128/86  11/01/23 118/80  08/20/23 (!) 165/103    Wt Readings from Last 3 Encounters:  11/19/23 183 lb (83 kg)  11/01/23 186 lb 12.8 oz (84.7 kg)  08/20/23 191 lb 2 oz (86.7 kg)     Physical Exam  Lab Results  Component Value Date   HGBA1C 5.7 (H) 11/20/2023   HGBA1C 5.6 08/02/2023   HGBA1C 5.6 05/22/2022    Lab Results  Component Value Date   CREATININE 1.06 11/19/2023   CREATININE 1.20 08/02/2023   CREATININE 1.17 11/16/2022    Lab Results  Component Value Date   WBC 4.9 11/19/2023   HGB 11.3 (L) 11/19/2023   HCT 36.1 (L) 11/19/2023   PLT 199 11/19/2023   GLUCOSE 127 (H) 11/19/2023   CHOL 105 11/20/2023   TRIG 47 11/20/2023   HDL 31 (L) 11/20/2023   LDLDIRECT 68 08/02/2023   LDLCALC 65 11/20/2023   ALT 20 11/19/2023   AST 26 11/19/2023   NA 136 11/19/2023   K 3.7 11/19/2023   CL 101 11/19/2023   CREATININE 1.06 11/19/2023   BUN 20 11/19/2023   CO2 26 11/19/2023   TSH 1.12 08/02/2023   PSA 1.20 08/02/2023   INR 1.0 11/19/2023   HGBA1C 5.7 (H) 11/20/2023   MICROALBUR 2.1 08/02/2023    US  Venous Img Lower Bilateral (DVT) Result Date: 11/20/2023 CLINICAL DATA:  Bilateral lower extremity edema EXAM: BILATERAL LOWER EXTREMITY VENOUS DOPPLER ULTRASOUND TECHNIQUE: Gray-scale sonography with graded compression, as well as color Doppler and duplex ultrasound were performed to evaluate the lower extremity deep venous systems from the level of the common femoral vein and including the common femoral, femoral, profunda femoral, popliteal and calf veins including the posterior tibial, peroneal and gastrocnemius veins when visible. The superficial great saphenous vein was also interrogated. Spectral Doppler was utilized to evaluate flow at rest and with distal augmentation maneuvers in the common femoral, femoral and popliteal veins. COMPARISON:  None Available. FINDINGS: RIGHT LOWER EXTREMITY Common Femoral Vein: No evidence of thrombus. Normal compressibility, respiratory phasicity and response to augmentation. Saphenofemoral Junction: No evidence of thrombus. Normal compressibility and flow on color Doppler imaging. Profunda Femoral Vein: No evidence of  thrombus. Normal compressibility and flow on color Doppler imaging. Femoral Vein: No evidence of thrombus. Normal compressibility, respiratory phasicity and response to augmentation. Popliteal Vein: No evidence of thrombus. Normal compressibility, respiratory phasicity and response to augmentation. Calf Veins: No evidence of thrombus. Normal compressibility and flow on color Doppler imaging. Superficial Great Saphenous Vein: No evidence of thrombus. Normal compressibility. Venous Reflux:  None. Other Findings:  None. LEFT LOWER EXTREMITY Common Femoral Vein: No evidence of thrombus. Normal compressibility, respiratory phasicity and response to augmentation. Saphenofemoral Junction: No evidence of thrombus. Normal compressibility and flow on color Doppler imaging. Profunda Femoral Vein: No evidence of thrombus. Normal compressibility and flow on color Doppler imaging. Femoral Vein: No evidence of thrombus. Normal compressibility, respiratory phasicity and response to augmentation. Popliteal Vein: No evidence of thrombus. Normal compressibility, respiratory phasicity and response to augmentation. Calf Veins: No evidence of thrombus. Normal compressibility and flow on color Doppler imaging. Superficial Great Saphenous Vein: No evidence of thrombus. Normal  compressibility. Venous Reflux:  None. Other Findings:  None. IMPRESSION: No evidence of deep venous thrombosis in either lower extremity. Electronically Signed   By: Wilkie Lent M.D.   On: 11/20/2023 15:47   ECHOCARDIOGRAM COMPLETE BUBBLE STUDY Result Date: 11/20/2023    ECHOCARDIOGRAM REPORT   Patient Name:   CHEYENNE SCHUMM Date of Exam: 11/20/2023 Medical Rec #:  [id number]        Height:       71.0 in Accession #:    [id number]       Weight:       183.0 lb Date of Birth:  23-Jun-1955        BSA:          2.030 m Patient Age:    68 years         BP:           118/83 mmHg Patient Gender: M                HR:           80 bpm. Exam Location:  [facility] Procedure: 2D  Echo, Cardiac Doppler, Color Doppler and Saline Contrast Bubble            Study (Both Spectral and Color Flow Doppler were utilized during            procedure). Indications:     TIA (transient ischemic attack) 435.9 / G45.9  History:         Patient has no prior history of Echocardiogram examinations.                  TIA.  Sonographer:     Ashley McNeely-Sloane Referring Phys:  [id number] [facility] Diagnosing Phys: Keller Paterson IMPRESSIONS  1. Left ventricular ejection fraction, by estimation, is 60 to 65%. The left ventricle has normal function. The left ventricle has no regional wall motion abnormalities. Left ventricular diastolic parameters were normal.  2. Right ventricular systolic function is normal. The right ventricular size is normal.  3. The mitral valve is normal in structure. Trivial mitral valve regurgitation.  4. The aortic valve is tricuspid. Aortic valve regurgitation is not visualized.  5. The inferior vena cava is normal in size with greater than 50% respiratory variability, suggesting right atrial pressure of 3 mmHg.  6. Agitated saline contrast bubble study was positive with shunting observed within 3-6 cardiac cycles suggestive of interatrial shunt. FINDINGS  Left Ventricle: Left ventricular ejection fraction, by estimation, is 60 to 65%. The left ventricle has normal function. The left ventricle has no regional wall motion abnormalities. The left ventricular internal cavity size was normal in size. There is  no left ventricular hypertrophy. Left ventricular diastolic parameters were normal. Right Ventricle: The right ventricular size is normal. No increase in right ventricular wall thickness. Right ventricular systolic function is normal. Left Atrium: Left atrial size was normal in size. Right Atrium: Right atrial size was normal in size. Pericardium: There is no evidence of pericardial effusion. Mitral Valve: The mitral valve is normal in structure. Trivial mitral valve  regurgitation. MV peak gradient, 3.3 mmHg. The mean mitral valve gradient is 1.0 mmHg. Tricuspid Valve: The tricuspid valve is normal in structure. Tricuspid valve regurgitation is trivial. Aortic Valve: The aortic valve is tricuspid. Aortic valve regurgitation is not visualized. Aortic valve mean gradient measures 4.0 mmHg. Aortic valve peak gradient measures 7.6 mmHg. Aortic valve area, by VTI measures 2.73 cm. Pulmonic Valve: The pulmonic valve was  not well visualized. Pulmonic valve regurgitation is not visualized. Aorta: The aortic root and ascending aorta are structurally normal, with no evidence of dilitation. Venous: The inferior vena cava is normal in size with greater than 50% respiratory variability, suggesting right atrial pressure of 3 mmHg. IAS/Shunts: Agitated saline contrast was given intravenously to evaluate for intracardiac shunting. Agitated saline contrast bubble study was positive with shunting observed within 3-6 cardiac cycles suggestive of interatrial shunt.  LEFT VENTRICLE PLAX 2D LVIDd:         5.00 cm     Diastology LVIDs:         3.80 cm     LV e' medial:    8.49 cm/s LV PW:         0.80 cm     LV E/e' medial:  6.8 LV IVS:        0.90 cm     LV e' lateral:   11.90 cm/s LVOT diam:     2.00 cm     LV E/e' lateral: 4.8 LV SV:         70 LV SV Index:   35 LVOT Area:     3.14 cm  LV Volumes (MOD) LV vol d, MOD A2C: 59.9 ml LV vol d, MOD A4C: 73.8 ml LV vol s, MOD A2C: 18.7 ml LV vol s, MOD A4C: 25.7 ml LV SV MOD A2C:     41.2 ml LV SV MOD A4C:     73.8 ml LV SV MOD BP:      44.9 ml RIGHT VENTRICLE RV Basal diam:  4.10 cm RV Mid diam:    3.10 cm RV S prime:     14.60 cm/s TAPSE (M-mode): 1.6 cm LEFT ATRIUM             Index        RIGHT ATRIUM           Index LA diam:        3.50 cm 1.72 cm/m   RA Area:     15.30 cm LA Vol (A2C):   36.2 ml 17.83 ml/m  RA Volume:   41.20 ml  20.29 ml/m LA Vol (A4C):   19.2 ml 9.46 ml/m LA Biplane Vol: 28.4 ml 13.99 ml/m  AORTIC VALVE                     PULMONIC VALVE AV Area (Vmax):    2.91 cm     PV Vmax:        1.26 m/s AV Area (Vmean):   2.90 cm     PV Vmean:       87.300 cm/s AV Area (VTI):     2.73 cm     PV VTI:         0.244 m AV Vmax:           138.00 cm/s  PV Peak grad:   6.4 mmHg AV Vmean:          90.800 cm/s  PV Mean grad:   4.0 mmHg AV VTI:            0.258 m      RVOT Peak grad: 3 mmHg AV Peak Grad:      7.6 mmHg AV Mean Grad:      4.0 mmHg LVOT Vmax:         128.00 cm/s LVOT Vmean:        83.800 cm/s LVOT VTI:  0.224 m LVOT/AV VTI ratio: 0.87  AORTA Ao Root diam: 2.90 cm Ao Asc diam:  2.90 cm MITRAL VALVE MV Area (PHT): 3.12 cm    SHUNTS MV Area VTI:   3.82 cm    Systemic VTI:  0.22 m MV Peak grad:  3.3 mmHg    Systemic Diam: 2.00 cm MV Mean grad:  1.0 mmHg    Pulmonic VTI:  0.185 m MV Vmax:       0.90 m/s MV Vmean:      57.2 cm/s MV Decel Time: 243 msec MV E velocity: 57.60 cm/s MV A velocity: 90.10 cm/s MV E/A ratio:  0.64 Keller Paterson Electronically signed by Keller Paterson Signature Date/Time: 11/20/2023/11:36:49 AM    Final     Assessment & Plan:  .There are no diagnoses linked to this encounter.   I spent 34 minutes on the day of this face to face encounter reviewing patient's  most recent visit with cardiology,  nephrology,  and neurology,  prior relevant surgical and non surgical procedures, recent  labs and imaging studies, counseling on weight management,  reviewing the assessment and plan with patient, and post visit ordering and reviewing of  diagnostics and therapeutics with patient  .   Follow-up: No follow-ups on file.   Verneita LITTIE Kettering, MD

## 2023-12-03 NOTE — Patient Instructions (Signed)
 Continue checking  BP  if readings are not 120/70 ballpark by August 1  we will increase the amlodipine    Start 5 mg generic crestor   to stabilize the placque in your right carotid bifurcation  and your aortic arch  Liver enzymes recheck in 3 week  no fasting required ,

## 2023-12-03 NOTE — Telephone Encounter (Signed)
 Spoke with pt and was able to schedule him for 4 pm this afternoon due to a cancellation.

## 2023-12-04 DIAGNOSIS — Z09 Encounter for follow-up examination after completed treatment for conditions other than malignant neoplasm: Secondary | ICD-10-CM | POA: Insufficient documentation

## 2023-12-04 NOTE — Assessment & Plan Note (Signed)
 Patient is stable post discharge .  He ha s  no new issues;  all   imaging studies and tests were reviewed with patient as well as any medication changes and referral,, .  All questions about his hospitalization were answered to his satisfaction

## 2023-12-04 NOTE — Assessment & Plan Note (Signed)
 He was admitted after having an episode of profound weakness which occurred at work.  Workup reviewed;  PFO suggested by ECHO bubble study;  no DVT in either LE.  No arrhythmia.  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  He is taking plavix  /asa as directed (DAPT for 3 weeks) and will continue aspirin  daily thereafter.  Neurology referral also in progress

## 2023-12-04 NOTE — Assessment & Plan Note (Addendum)
 Noted March 2025. Persistent despite suspending blood donation (Last blood donation was mid January 2025 )  Iron, b12 and FOBT ordered in March BUT NOT DONE .  REORDERING NOW    Lab Results  Component Value Date   WBC 4.9 11/19/2023   HGB 11.3 (L) 11/19/2023   HCT 36.1 (L) 11/19/2023   MCV 81.5 11/19/2023   PLT 199 11/19/2023   Lab Results  Component Value Date   IRON 52 12/16/2020   TIBC 328 12/16/2020   FERRITIN 66 12/16/2020

## 2023-12-04 NOTE — Assessment & Plan Note (Signed)
 Presumedly a PFO although not described as such.  Found during ECHO/bubble study during July admission for TIA .  Cardiology referral has been made

## 2023-12-04 NOTE — Assessment & Plan Note (Addendum)
 Under management by Dr Bonner with RFA planned

## 2023-12-07 ENCOUNTER — Other Ambulatory Visit: Payer: Self-pay | Admitting: Obstetrics and Gynecology

## 2023-12-07 ENCOUNTER — Encounter: Payer: Self-pay | Admitting: Internal Medicine

## 2023-12-11 DIAGNOSIS — Q2112 Patent foramen ovale: Secondary | ICD-10-CM | POA: Diagnosis not present

## 2023-12-11 DIAGNOSIS — G459 Transient cerebral ischemic attack, unspecified: Secondary | ICD-10-CM | POA: Diagnosis not present

## 2023-12-11 DIAGNOSIS — I6529 Occlusion and stenosis of unspecified carotid artery: Secondary | ICD-10-CM | POA: Diagnosis not present

## 2023-12-11 DIAGNOSIS — G894 Chronic pain syndrome: Secondary | ICD-10-CM | POA: Diagnosis not present

## 2023-12-13 ENCOUNTER — Inpatient Hospital Stay: Admitting: Internal Medicine

## 2023-12-20 DIAGNOSIS — I10 Essential (primary) hypertension: Secondary | ICD-10-CM | POA: Diagnosis not present

## 2023-12-20 DIAGNOSIS — E782 Mixed hyperlipidemia: Secondary | ICD-10-CM | POA: Diagnosis not present

## 2023-12-20 DIAGNOSIS — Q2112 Patent foramen ovale: Secondary | ICD-10-CM | POA: Diagnosis not present

## 2023-12-20 DIAGNOSIS — Q248 Other specified congenital malformations of heart: Secondary | ICD-10-CM | POA: Diagnosis not present

## 2023-12-28 ENCOUNTER — Encounter: Payer: Self-pay | Admitting: Internal Medicine

## 2023-12-31 ENCOUNTER — Encounter: Payer: Self-pay | Admitting: Internal Medicine

## 2024-01-10 ENCOUNTER — Inpatient Hospital Stay: Admitting: Internal Medicine

## 2024-01-11 ENCOUNTER — Encounter: Payer: Self-pay | Admitting: Internal Medicine

## 2024-01-11 DIAGNOSIS — I10 Essential (primary) hypertension: Secondary | ICD-10-CM

## 2024-01-14 NOTE — Assessment & Plan Note (Signed)
 Home readings 128/71 August 2025

## 2024-01-20 ENCOUNTER — Encounter: Payer: Self-pay | Admitting: Internal Medicine

## 2024-01-20 DIAGNOSIS — I10 Essential (primary) hypertension: Secondary | ICD-10-CM

## 2024-01-21 ENCOUNTER — Encounter: Payer: Self-pay | Admitting: Internal Medicine

## 2024-01-21 DIAGNOSIS — M549 Dorsalgia, unspecified: Secondary | ICD-10-CM | POA: Diagnosis not present

## 2024-01-21 DIAGNOSIS — M47816 Spondylosis without myelopathy or radiculopathy, lumbar region: Secondary | ICD-10-CM | POA: Diagnosis not present

## 2024-01-21 NOTE — Assessment & Plan Note (Signed)
 Home readings done. Most recent 128/72  sept 2

## 2024-03-12 DIAGNOSIS — M5416 Radiculopathy, lumbar region: Secondary | ICD-10-CM | POA: Diagnosis not present

## 2024-03-12 DIAGNOSIS — G8929 Other chronic pain: Secondary | ICD-10-CM | POA: Diagnosis not present

## 2024-03-16 DIAGNOSIS — R2990 Unspecified symptoms and signs involving the nervous system: Secondary | ICD-10-CM | POA: Diagnosis not present

## 2024-03-16 DIAGNOSIS — Z1331 Encounter for screening for depression: Secondary | ICD-10-CM | POA: Diagnosis not present

## 2024-03-16 DIAGNOSIS — G939 Disorder of brain, unspecified: Secondary | ICD-10-CM | POA: Diagnosis not present

## 2024-03-16 DIAGNOSIS — G319 Degenerative disease of nervous system, unspecified: Secondary | ICD-10-CM | POA: Diagnosis not present

## 2024-04-04 ENCOUNTER — Other Ambulatory Visit: Payer: Self-pay | Admitting: Internal Medicine

## 2024-04-06 ENCOUNTER — Other Ambulatory Visit: Payer: Self-pay

## 2024-04-06 MED ORDER — ZOLPIDEM TARTRATE 10 MG PO TABS: ORAL_TABLET | ORAL | 2 refills | Status: AC

## 2024-04-08 DIAGNOSIS — M25552 Pain in left hip: Secondary | ICD-10-CM | POA: Diagnosis not present

## 2024-04-10 DIAGNOSIS — M5416 Radiculopathy, lumbar region: Secondary | ICD-10-CM | POA: Diagnosis not present

## 2024-04-21 ENCOUNTER — Ambulatory Visit: Admitting: Dermatology

## 2024-04-21 DIAGNOSIS — Z86006 Personal history of melanoma in-situ: Secondary | ICD-10-CM

## 2024-04-21 DIAGNOSIS — D2310 Other benign neoplasm of skin of unspecified eyelid, including canthus: Secondary | ICD-10-CM

## 2024-04-21 DIAGNOSIS — L820 Inflamed seborrheic keratosis: Secondary | ICD-10-CM | POA: Diagnosis not present

## 2024-04-21 DIAGNOSIS — L853 Xerosis cutis: Secondary | ICD-10-CM

## 2024-04-21 DIAGNOSIS — Z1283 Encounter for screening for malignant neoplasm of skin: Secondary | ICD-10-CM

## 2024-04-21 DIAGNOSIS — L299 Pruritus, unspecified: Secondary | ICD-10-CM

## 2024-04-21 DIAGNOSIS — W908XXA Exposure to other nonionizing radiation, initial encounter: Secondary | ICD-10-CM

## 2024-04-21 DIAGNOSIS — L570 Actinic keratosis: Secondary | ICD-10-CM

## 2024-04-21 DIAGNOSIS — L578 Other skin changes due to chronic exposure to nonionizing radiation: Secondary | ICD-10-CM

## 2024-04-21 DIAGNOSIS — L814 Other melanin hyperpigmentation: Secondary | ICD-10-CM | POA: Diagnosis not present

## 2024-04-21 DIAGNOSIS — D229 Melanocytic nevi, unspecified: Secondary | ICD-10-CM

## 2024-04-21 DIAGNOSIS — D23111 Other benign neoplasm of skin of right upper eyelid, including canthus: Secondary | ICD-10-CM

## 2024-04-21 DIAGNOSIS — L821 Other seborrheic keratosis: Secondary | ICD-10-CM | POA: Diagnosis not present

## 2024-04-21 DIAGNOSIS — D1801 Hemangioma of skin and subcutaneous tissue: Secondary | ICD-10-CM

## 2024-04-21 NOTE — Progress Notes (Signed)
 Follow-Up Visit   Subjective  Seth Doyle is a 68 y.o. male who presents for the following: Skin Cancer Screening and Upper Body Skin Exam  The patient presents for Upper Body Skin Exam (UBSE) for skin cancer screening and mole check. The patient has spots, moles and lesions to be evaluated, some may be new or changing. He has a spot on the left hand to check today, has been treating with clobetasol  cream but stays pink. 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 Also has scaly spots on scalp.  The following portions of the chart were reviewed this encounter and updated as appropriate: medications, allergies, medical history  Review of Systems:  No other skin or systemic complaints except as noted in HPI or Assessment and Plan.  Objective  Well appearing patient in no apparent distress; mood and affect are within normal limits.  All skin waist up examined. Relevant physical exam findings are noted in the Assessment and Plan.  L hand dorsum x 1 Pink/tan scaly macule scalp x 7, R temporal scalp x 1, R upper chest x 2, R lower neck x 1, R neck x 1 (12) Pink keratotic macules.   Assessment & Plan  Skin cancer screening performed today. INFLAMED SEBORRHEIC KERATOSIS L hand dorsum x 1 Symptomatic, irritating, patient would like treated. Destruction of lesion - L hand dorsum x 1  Destruction method: cryotherapy   Informed consent: discussed and consent obtained   Lesion destroyed using liquid nitrogen: Yes   Region frozen until ice ball extended beyond lesion: Yes   Outcome: patient tolerated procedure well with no complications   Post-procedure details: wound care instructions given   Additional details:  Prior to procedure, discussed risks of blister formation, small wound, skin dyspigmentation, or rare scar following cryotherapy. Recommend Vaseline ointment to treated areas while healing.   AK (ACTINIC KERATOSIS) (12) scalp  x 7, R temporal scalp x 1, R upper chest x 2, R lower neck x 1, R neck x 1 (12) vs ISKs.  Actinic keratoses are precancerous spots that appear secondary to cumulative UV radiation exposure/sun exposure over time. They are chronic with expected duration over 1 year. A portion of actinic keratoses will progress to squamous cell carcinoma of the skin. It is not possible to reliably predict which spots will progress to skin cancer and so treatment is recommended to prevent development of skin cancer.  Recommend daily broad spectrum sunscreen SPF 30+ to sun-exposed areas, reapply every 2 hours as needed.  Recommend staying in the shade or wearing long sleeves, sun glasses (UVA+UVB protection) and wide brim hats (4-inch brim around the entire circumference of the hat). Call for new or changing lesions. Destruction of lesion - scalp x 7, R temporal scalp x 1, R upper chest x 2, R lower neck x 1, R neck x 1 (12)  Destruction method: cryotherapy   Informed consent: discussed and consent obtained   Lesion destroyed using liquid nitrogen: Yes   Region frozen until ice ball extended beyond lesion: Yes   Outcome: patient tolerated procedure well with no complications   Post-procedure details: wound care instructions given   Additional details:  Prior to procedure, discussed risks of blister formation, small wound, skin dyspigmentation, or rare scar following cryotherapy. Recommend Vaseline ointment to treated areas while healing.     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 - No evidence of recurrence today of  the right upper forehead - Recommend regular full body skin exams - Recommend daily broad spectrum sunscreen SPF 30+ to sun-exposed areas, reapply every 2 hours as needed.  - Call if any new or changing lesions are noted between office visit  Actinic Damage - Chronic condition, secondary to cumulative UV/sun exposure - diffuse scaly erythematous macules with  underlying dyspigmentation - Recommend daily broad spectrum sunscreen SPF 30+ to sun-exposed areas, reapply every 2 hours as needed.  - Staying in the shade or wearing long sleeves, sun glasses (UVA+UVB protection) and wide brim hats (4-inch brim around the entire circumference of the hat) are also recommended for sun protection.  - Call for new or changing lesions.  Lentigines, Seborrheic Keratoses, Hemangiomas - Benign normal skin lesions - Benign-appearing - Call for any changes  Melanocytic Nevi - Tan-brown and/or pink-flesh-colored symmetric macules and papules - Benign appearing on exam today - Observation - Call clinic for new or changing moles - Recommend daily use of broad spectrum spf 30+ sunscreen to sun-exposed areas.   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 Currently well-controlled with topical treatment as needed for flares.      Treatment Plan: Continue Hydroxyzine  25mg  1 tablet at bedtime as needed for itching. Continue Clobetasol /CeraVe cream mix to aas body qd/bid prn itchy rash up to 4 weeks, avoid f/g/a. Continue Clobetasol  Cream QD/BID prn itch to more severe areas up to 2 weeks. Caution skin atrophy with long-term use.  Continue CeraVe Itch Relief Cream.    Topical steroids (such as triamcinolone , fluocinolone, fluocinonide, mometasone, clobetasol , halobetasol, betamethasone, hydrocortisone) can cause thinning and lightening of the skin if they are used for too long in the same area. Your physician has selected the right strength medicine for your problem and area affected on the body. Please use your medication only as directed by your physician to prevent side effects.    Recommend mild soap and moisturizing cream 1-2 times daily.    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  Observation.  Call clinic for new or changing lesions. Can be drained or destroyed if symptomatic, but recurrence is likely.  Complete surgical excision may be necessary to clear. Would recommend opthalmologic surgeon  Return in about 6 months (around 10/20/2024) for UBSE, Hx melanoma IS.  IAndrea Kerns, CMA, am acting as scribe for Rexene Rattler, MD .   Documentation: I have reviewed the above documentation for accuracy and completeness, and I agree with the above.  Rexene Rattler, MD

## 2024-04-21 NOTE — Patient Instructions (Addendum)

## 2024-04-24 DIAGNOSIS — M545 Low back pain, unspecified: Secondary | ICD-10-CM | POA: Diagnosis not present

## 2024-11-03 ENCOUNTER — Ambulatory Visit: Admitting: Dermatology
